# Patient Record
Sex: Female | Born: 1955 | Race: Black or African American | Hispanic: No | Marital: Single | Smoking: Never smoker
Health system: Southern US, Community
[De-identification: ages and names within clinical notes are randomized; demographics above are authoritative.]

## PROBLEM LIST (undated history)

## (undated) DIAGNOSIS — F32A Depression, unspecified: Secondary | ICD-10-CM

## (undated) DIAGNOSIS — I493 Ventricular premature depolarization: Secondary | ICD-10-CM

## (undated) DIAGNOSIS — I10 Essential (primary) hypertension: Secondary | ICD-10-CM

## (undated) DIAGNOSIS — Z9289 Personal history of other medical treatment: Secondary | ICD-10-CM

## (undated) DIAGNOSIS — IMO0002 Reserved for concepts with insufficient information to code with codable children: Secondary | ICD-10-CM

## (undated) DIAGNOSIS — F329 Major depressive disorder, single episode, unspecified: Secondary | ICD-10-CM

## (undated) DIAGNOSIS — J40 Bronchitis, not specified as acute or chronic: Secondary | ICD-10-CM

## (undated) DIAGNOSIS — M1990 Unspecified osteoarthritis, unspecified site: Secondary | ICD-10-CM

## (undated) DIAGNOSIS — E119 Type 2 diabetes mellitus without complications: Secondary | ICD-10-CM

## (undated) HISTORY — DX: Essential (primary) hypertension: I10

## (undated) HISTORY — DX: Major depressive disorder, single episode, unspecified: F32.9

## (undated) HISTORY — DX: Ventricular premature depolarization: I49.3

## (undated) HISTORY — DX: Unspecified osteoarthritis, unspecified site: M19.90

## (undated) HISTORY — DX: Depression, unspecified: F32.A

## (undated) HISTORY — PX: KNEE ARTHROSCOPY: SUR90

## (undated) HISTORY — DX: Personal history of other medical treatment: Z92.89

---

## 1983-05-27 HISTORY — PX: CARPAL TUNNEL RELEASE: SHX101

## 1984-05-26 HISTORY — PX: HEMORRHOID SURGERY: SHX153

## 1986-05-26 HISTORY — PX: ABDOMINAL HYSTERECTOMY: SHX81

## 1987-05-27 HISTORY — PX: CHOLECYSTECTOMY: SHX55

## 1999-08-02 ENCOUNTER — Emergency Department (HOSPITAL_COMMUNITY): Admission: EM | Admit: 1999-08-02 | Discharge: 1999-08-02 | Payer: Self-pay | Admitting: *Deleted

## 1999-08-18 ENCOUNTER — Emergency Department (HOSPITAL_COMMUNITY): Admission: EM | Admit: 1999-08-18 | Discharge: 1999-08-18 | Payer: Self-pay | Admitting: Emergency Medicine

## 1999-11-29 ENCOUNTER — Encounter: Payer: Self-pay | Admitting: Family Medicine

## 1999-11-29 ENCOUNTER — Ambulatory Visit (HOSPITAL_COMMUNITY): Admission: RE | Admit: 1999-11-29 | Discharge: 1999-11-29 | Payer: Self-pay | Admitting: Family Medicine

## 1999-12-08 ENCOUNTER — Emergency Department (HOSPITAL_COMMUNITY): Admission: EM | Admit: 1999-12-08 | Discharge: 1999-12-08 | Payer: Self-pay | Admitting: Internal Medicine

## 1999-12-10 ENCOUNTER — Ambulatory Visit (HOSPITAL_COMMUNITY): Admission: RE | Admit: 1999-12-10 | Discharge: 1999-12-10 | Payer: Self-pay | Admitting: Family Medicine

## 1999-12-10 ENCOUNTER — Encounter: Payer: Self-pay | Admitting: Family Medicine

## 2001-06-20 ENCOUNTER — Emergency Department (HOSPITAL_COMMUNITY): Admission: EM | Admit: 2001-06-20 | Discharge: 2001-06-20 | Payer: Self-pay | Admitting: Emergency Medicine

## 2001-08-18 ENCOUNTER — Encounter: Payer: Self-pay | Admitting: Family Medicine

## 2001-08-18 ENCOUNTER — Encounter: Admission: RE | Admit: 2001-08-18 | Discharge: 2001-08-18 | Payer: Self-pay | Admitting: Family Medicine

## 2002-10-12 ENCOUNTER — Emergency Department (HOSPITAL_COMMUNITY): Admission: EM | Admit: 2002-10-12 | Discharge: 2002-10-12 | Payer: Self-pay | Admitting: Emergency Medicine

## 2003-10-31 ENCOUNTER — Emergency Department (HOSPITAL_COMMUNITY): Admission: EM | Admit: 2003-10-31 | Discharge: 2003-10-31 | Payer: Self-pay | Admitting: Emergency Medicine

## 2003-12-29 ENCOUNTER — Ambulatory Visit (HOSPITAL_COMMUNITY): Admission: RE | Admit: 2003-12-29 | Discharge: 2003-12-29 | Payer: Self-pay | Admitting: Family Medicine

## 2004-05-24 ENCOUNTER — Ambulatory Visit: Payer: Self-pay | Admitting: *Deleted

## 2004-05-24 ENCOUNTER — Ambulatory Visit: Payer: Self-pay | Admitting: Family Medicine

## 2004-06-04 ENCOUNTER — Ambulatory Visit (HOSPITAL_COMMUNITY): Admission: RE | Admit: 2004-06-04 | Discharge: 2004-06-04 | Payer: Self-pay | Admitting: Family Medicine

## 2004-06-24 ENCOUNTER — Ambulatory Visit: Payer: Self-pay | Admitting: Family Medicine

## 2004-07-02 ENCOUNTER — Ambulatory Visit (HOSPITAL_COMMUNITY): Admission: RE | Admit: 2004-07-02 | Discharge: 2004-07-02 | Payer: Self-pay | Admitting: Family Medicine

## 2004-11-25 ENCOUNTER — Ambulatory Visit: Payer: Self-pay | Admitting: Family Medicine

## 2005-09-25 ENCOUNTER — Emergency Department (HOSPITAL_COMMUNITY): Admission: EM | Admit: 2005-09-25 | Discharge: 2005-09-25 | Payer: Self-pay | Admitting: Family Medicine

## 2005-10-07 ENCOUNTER — Ambulatory Visit: Payer: Self-pay | Admitting: Family Medicine

## 2005-11-27 ENCOUNTER — Ambulatory Visit: Payer: Self-pay | Admitting: Family Medicine

## 2005-12-02 ENCOUNTER — Ambulatory Visit (HOSPITAL_COMMUNITY): Admission: RE | Admit: 2005-12-02 | Discharge: 2005-12-02 | Payer: Self-pay | Admitting: Family Medicine

## 2006-01-20 ENCOUNTER — Ambulatory Visit: Payer: Self-pay | Admitting: Family Medicine

## 2006-02-03 ENCOUNTER — Emergency Department (HOSPITAL_COMMUNITY): Admission: EM | Admit: 2006-02-03 | Discharge: 2006-02-04 | Payer: Self-pay | Admitting: Emergency Medicine

## 2006-02-05 ENCOUNTER — Emergency Department (HOSPITAL_COMMUNITY): Admission: EM | Admit: 2006-02-05 | Discharge: 2006-02-05 | Payer: Self-pay | Admitting: Family Medicine

## 2006-02-06 ENCOUNTER — Ambulatory Visit: Payer: Self-pay | Admitting: Nurse Practitioner

## 2006-02-10 ENCOUNTER — Ambulatory Visit: Payer: Self-pay | Admitting: Family Medicine

## 2006-07-29 ENCOUNTER — Telehealth: Payer: Self-pay | Admitting: *Deleted

## 2006-09-22 ENCOUNTER — Telehealth (INDEPENDENT_AMBULATORY_CARE_PROVIDER_SITE_OTHER): Payer: Self-pay

## 2006-09-23 ENCOUNTER — Ambulatory Visit: Payer: Self-pay | Admitting: Family Medicine

## 2006-09-23 ENCOUNTER — Encounter: Payer: Self-pay | Admitting: Family Medicine

## 2006-09-23 ENCOUNTER — Encounter: Payer: Self-pay | Admitting: *Deleted

## 2006-09-23 DIAGNOSIS — K625 Hemorrhage of anus and rectum: Secondary | ICD-10-CM | POA: Insufficient documentation

## 2006-09-24 ENCOUNTER — Telehealth: Payer: Self-pay | Admitting: *Deleted

## 2006-09-24 ENCOUNTER — Telehealth: Payer: Self-pay | Admitting: Family Medicine

## 2006-09-29 ENCOUNTER — Ambulatory Visit: Payer: Self-pay | Admitting: Family Medicine

## 2006-09-29 ENCOUNTER — Encounter (INDEPENDENT_AMBULATORY_CARE_PROVIDER_SITE_OTHER): Payer: Self-pay | Admitting: *Deleted

## 2006-09-29 LAB — CONVERTED CEMR LAB
BUN: 14 mg/dL (ref 6–23)
Calcium: 9.3 mg/dL (ref 8.4–10.5)
Chloride: 104 meq/L (ref 96–112)
Creatinine, Ser: 0.71 mg/dL (ref 0.40–1.20)
Glucose, Bld: 86 mg/dL (ref 70–99)
Potassium: 4.2 meq/L (ref 3.5–5.3)
Sodium: 142 meq/L (ref 135–145)

## 2006-10-02 ENCOUNTER — Encounter: Payer: Self-pay | Admitting: Family Medicine

## 2006-10-22 ENCOUNTER — Ambulatory Visit: Payer: Self-pay | Admitting: Family Medicine

## 2006-10-26 ENCOUNTER — Telehealth (INDEPENDENT_AMBULATORY_CARE_PROVIDER_SITE_OTHER): Payer: Self-pay | Admitting: Family Medicine

## 2006-11-10 ENCOUNTER — Ambulatory Visit: Payer: Self-pay | Admitting: Gastroenterology

## 2006-11-10 LAB — CONVERTED CEMR LAB
Basophils Relative: 0.6 % (ref 0.0–1.0)
Eosinophils Relative: 3.3 % (ref 0.0–5.0)
Monocytes Relative: 8.6 % (ref 3.0–11.0)
Platelets: 275 10*3/uL (ref 150–400)
RBC: 3.77 M/uL — ABNORMAL LOW (ref 3.87–5.11)
RDW: 12.8 % (ref 11.5–14.6)
WBC: 6.1 10*3/uL (ref 4.5–10.5)

## 2006-11-13 ENCOUNTER — Telehealth: Payer: Self-pay | Admitting: *Deleted

## 2006-12-07 ENCOUNTER — Ambulatory Visit (HOSPITAL_COMMUNITY): Admission: RE | Admit: 2006-12-07 | Discharge: 2006-12-07 | Payer: Self-pay | Admitting: Gastroenterology

## 2006-12-07 ENCOUNTER — Encounter: Payer: Self-pay | Admitting: Gastroenterology

## 2006-12-11 ENCOUNTER — Ambulatory Visit: Payer: Self-pay | Admitting: Gastroenterology

## 2007-01-01 ENCOUNTER — Encounter: Payer: Self-pay | Admitting: Family Medicine

## 2007-02-04 ENCOUNTER — Encounter: Payer: Self-pay | Admitting: Family Medicine

## 2007-04-29 ENCOUNTER — Ambulatory Visit: Payer: Self-pay | Admitting: Family Medicine

## 2007-04-29 DIAGNOSIS — N318 Other neuromuscular dysfunction of bladder: Secondary | ICD-10-CM | POA: Insufficient documentation

## 2007-05-10 ENCOUNTER — Emergency Department (HOSPITAL_COMMUNITY): Admission: EM | Admit: 2007-05-10 | Discharge: 2007-05-10 | Payer: Self-pay | Admitting: Family Medicine

## 2007-06-02 ENCOUNTER — Telehealth: Payer: Self-pay | Admitting: *Deleted

## 2007-06-03 ENCOUNTER — Encounter: Payer: Self-pay | Admitting: *Deleted

## 2007-09-18 DIAGNOSIS — I10 Essential (primary) hypertension: Secondary | ICD-10-CM | POA: Insufficient documentation

## 2007-10-25 ENCOUNTER — Telehealth: Payer: Self-pay | Admitting: Family Medicine

## 2007-10-26 ENCOUNTER — Telehealth: Payer: Self-pay | Admitting: *Deleted

## 2007-11-25 ENCOUNTER — Telehealth: Payer: Self-pay | Admitting: *Deleted

## 2008-02-21 ENCOUNTER — Telehealth (INDEPENDENT_AMBULATORY_CARE_PROVIDER_SITE_OTHER): Payer: Self-pay | Admitting: *Deleted

## 2008-09-06 ENCOUNTER — Emergency Department (HOSPITAL_COMMUNITY): Admission: EM | Admit: 2008-09-06 | Discharge: 2008-09-06 | Payer: Self-pay | Admitting: Family Medicine

## 2008-09-15 ENCOUNTER — Ambulatory Visit: Payer: Self-pay | Admitting: Family Medicine

## 2008-09-15 ENCOUNTER — Encounter: Payer: Self-pay | Admitting: Family Medicine

## 2008-09-15 DIAGNOSIS — S6990XA Unspecified injury of unspecified wrist, hand and finger(s), initial encounter: Secondary | ICD-10-CM | POA: Insufficient documentation

## 2008-09-15 DIAGNOSIS — S6980XA Other specified injuries of unspecified wrist, hand and finger(s), initial encounter: Secondary | ICD-10-CM | POA: Insufficient documentation

## 2008-09-15 LAB — CONVERTED CEMR LAB
CO2: 30 meq/L (ref 19–32)
Chloride: 100 meq/L (ref 96–112)
Creatinine, Ser: 0.67 mg/dL (ref 0.40–1.20)
HCT: 41.7 % (ref 36.0–46.0)
HDL: 53 mg/dL (ref >39)
LDL Cholesterol: 105 mg/dL — ABNORMAL HIGH (ref 0–99)
Platelets: 282 10*3/uL (ref 150–400)
Potassium: 4.5 meq/L (ref 3.5–5.3)
RDW: 13.9 % (ref 11.5–15.5)
Total CHOL/HDL Ratio: 3.5
Triglycerides: 123 mg/dL (ref <150)
VLDL: 25 mg/dL (ref 0–40)
WBC: 6.1 10*3/uL (ref 4.0–10.5)

## 2008-09-16 ENCOUNTER — Encounter: Payer: Self-pay | Admitting: Family Medicine

## 2008-09-29 ENCOUNTER — Telehealth (INDEPENDENT_AMBULATORY_CARE_PROVIDER_SITE_OTHER): Payer: Self-pay | Admitting: *Deleted

## 2008-10-24 ENCOUNTER — Encounter: Payer: Self-pay | Admitting: Family Medicine

## 2008-12-14 ENCOUNTER — Telehealth: Payer: Self-pay | Admitting: Family Medicine

## 2008-12-15 ENCOUNTER — Encounter: Payer: Self-pay | Admitting: Family Medicine

## 2008-12-15 ENCOUNTER — Ambulatory Visit: Payer: Self-pay | Admitting: Family Medicine

## 2008-12-15 DIAGNOSIS — K029 Dental caries, unspecified: Secondary | ICD-10-CM | POA: Insufficient documentation

## 2008-12-15 DIAGNOSIS — M159 Polyosteoarthritis, unspecified: Secondary | ICD-10-CM | POA: Insufficient documentation

## 2009-02-03 ENCOUNTER — Emergency Department (HOSPITAL_COMMUNITY): Admission: EM | Admit: 2009-02-03 | Discharge: 2009-02-03 | Payer: Self-pay | Admitting: Emergency Medicine

## 2009-04-11 ENCOUNTER — Telehealth (INDEPENDENT_AMBULATORY_CARE_PROVIDER_SITE_OTHER): Payer: Self-pay | Admitting: Family Medicine

## 2009-06-11 ENCOUNTER — Emergency Department (HOSPITAL_COMMUNITY): Admission: EM | Admit: 2009-06-11 | Discharge: 2009-06-11 | Payer: Self-pay | Admitting: Emergency Medicine

## 2009-10-05 ENCOUNTER — Encounter: Payer: Self-pay | Admitting: Family Medicine

## 2010-01-21 ENCOUNTER — Ambulatory Visit: Payer: Self-pay | Admitting: Family Medicine

## 2010-01-21 DIAGNOSIS — R5381 Other malaise: Secondary | ICD-10-CM | POA: Insufficient documentation

## 2010-01-21 DIAGNOSIS — R209 Unspecified disturbances of skin sensation: Secondary | ICD-10-CM | POA: Insufficient documentation

## 2010-01-21 DIAGNOSIS — R5383 Other fatigue: Secondary | ICD-10-CM

## 2010-03-07 ENCOUNTER — Encounter: Payer: Self-pay | Admitting: Family Medicine

## 2010-03-07 ENCOUNTER — Ambulatory Visit: Payer: Self-pay | Admitting: Family Medicine

## 2010-03-07 LAB — CONVERTED CEMR LAB
BUN: 13 mg/dL (ref 6–23)
Chloride: 101 meq/L (ref 96–112)
Hemoglobin: 13.1 g/dL (ref 12.0–15.0)
LDL Cholesterol: 92 mg/dL (ref 0–99)
MCHC: 33.1 g/dL (ref 30.0–36.0)
MCV: 94.1 fL (ref 78.0–100.0)
Potassium: 4 meq/L (ref 3.5–5.3)
RBC: 4.21 M/uL (ref 3.87–5.11)
VLDL: 14 mg/dL (ref 0–40)
Vitamin B-12: 589 pg/mL (ref 211–911)

## 2010-06-15 ENCOUNTER — Encounter: Payer: Self-pay | Admitting: *Deleted

## 2010-06-25 NOTE — Assessment & Plan Note (Signed)
Summary: f/u meds,df   Vital Signs:  Patient profile:   55 year old female Height:      64.25 inches Weight:      209 pounds BMI:     35.72 BSA:     2.00 Temp:     97.7 degrees F Pulse rate:   65 / minute BP sitting:   134 / 88  Vitals Entered By: Jone Baseman CMA (January 21, 2010 10:17 AM) CC: f/u meds Is Patient Diabetic? No Pain Assessment Patient in pain? yes     Location: feet and legs Intensity: 8   Primary Care Provider:  Asher Muir MD  CC:  f/u meds.  History of Present Illness: Pt here for nubmness, pain in feet and hands.  THis has been a long standing problems.  Feet have been hurting since 1988 when was seen by [facility] and dx with heel spur/ arthritis.  Reports her feet have been cold, achy, numb, tingling and painful, worsening for 2 years.  She fell recently from the pain and was seen at Urgent Care and again told she has arthritis.   Taking her BP meds as prescribed. No problems or side effects.     Pt has numbness in hands, s/p carpal tunnel release.  + Shoulder pain  Has not had a mammogram in years.  Feels sharp pains in breasts sometimes for years.    No pap for years.  Unsure if she has a cervix.  Had hysterectomy in 1988.    Also with hair loss - was told it was alopecia.   Habits & Providers  Alcohol-Tobacco-Diet     Tobacco Status: never  Current Medications (verified): 1)  Hydrochlorothiazide 25 Mg Tabs (Hydrochlorothiazide) .... Take 1 Tablet By Mouth Daily For Blood Pressure 2)  Ranitidine Hcl 150 Mg Tabs (Ranitidine Hcl) .... Take 1 Tablet By Mouth Two Times A Day To Protect Your Stomach 3)  Diclofenac Sodium 75 Mg Tbec (Diclofenac Sodium) .Marland Kitchen.. 1 By Mouth Two Times A Day As Needed Arthritis Pain.  Take With Food. 4)  Fish Oil  Oil (Fish Oil) 5)  Calcium Plus Vitamin D 600-100 Mg-Unit Caps (Calcium Carbonate-Vitamin D)  Allergies (verified): No Known Drug Allergies  Past History:  Past medical, surgical, family and  social histories (including risk factors) reviewed, and no changes noted (except as noted below).  Past Medical History: Reviewed history from 10/02/2006 and no changes required. iPMH: C/o Blood in stools since 06/1994 hemoccult + stool 1/06 Barium enema 2//06 -- colonic diverticula Hx external hemorrhoids Chronic constipation Hx colon polyps Hx LBP w/ paresthesia, hx several falls in past while working Hx DJD Obesity Urinary incontinence, has been on detrol LA 2mg  once daily and vesicare 5mg  once daily in past Asthma  GERD  Labs: 7/07: Hb 12.8, TSH 0.802  5/07: T-chol 164, TG 125, HDL 51, LDL 88, VLDL 25, ratio 3.2          rheumatoid factor <20, uric acid 5.1 1/06: CT abd/pelvis: focal fatty infiltration of liver, left hepatic lobe 12/05: PAP negative 8/05: Mammogram neg 7/05: cytopath on urine: no malignant cells          ANA neg 10/96: HIV neg R leg pain -- has been on ultram  Hx HTN (1996) -- was on Procardia  Past Surgical History: Reviewed history from 10/02/2006 and no changes required. PSH: Hysterectomy Surgery for hemorrhoids in 1987-1988  Cholecystectomy  Family History: Reviewed history from 12/15/2008 and no changes required. mother with rheumatoid arthritis  gf died age 76 MI, lots of cousins with  heart disease at young age. Uncle had throat ca,  other uncle with leukemia Gm died 27 with cancer  Social History: Reviewed history from 12/15/2008 and no changes required. works at [employer] at [employer]   No smoking, No ETOH, No other drugs. Single.    Review of Systems       The patient complains of hematochezia.  The patient denies chest pain.         Pt also with constipation, fatigue.  Feels short of breath due to her asthma.    Physical Exam  General:  Obese, no distress.  vital noted Lungs:  Normal respiratory effort, chest expands symmetrically. Lungs are clear to auscultation, no crackles or wheezes. Heart:  Normal rate and regular  rhythm. S1 and S2 normal without gallop, murmur, click, rub or other extra sounds. Extremities:  B feet without lesions.  DP 2+ B.  + Callous formation on heels B.  Sensation to cold and light touch intact.  Monofiliament felt in all locations on R foot except base of 5th toe, between first and secon toes and over heel.  Felt in all locations L foot excpet 4th toe, base of 5th toe, and heel.   Neurologic:  Unable to toe walk due to pain in legs.  Able to heel walk.  Tandem gait slightly unsteady, reports it is from arthritis in knees.   Impression & Recommendations:  Problem # 1:  NUMBNESS (ICD-782.0) Pt with long standing numbness and pain in her feet.  Will check some labs today. If no clear cause for pain/numbness, will try NSAID for relief. Orders: B12-[facility] ([id number]) TSH-[facility] ([id number]) RPR-[facility] [phone]) [facility]- Est Level  3 ([id number])  Problem # 2:  HYPERTENSION (ICD-401.9) Ran out of meds today.  Will check BMP today.  Also due for lipid panel.  Fasting today, will check today Her updated medication list for this problem includes:    Hydrochlorothiazide 25 Mg Tabs (Hydrochlorothiazide) .Marland Kitchen... Take 1 tablet by mouth daily for blood pressure  Orders: Lipid-[facility] ([id number]) Basic Met-[facility] ([id number]) [facility]- Est Level  3 (67209)  Problem # 3:  WEAKNESS (ICD-780.79) Pt with weakness and fatigue.  Will check a CBC.  + rectal blood loss, dx with hemorrhoids by colonoscopy Orders: CBC-[facility] (47096) B12-[facility] ([id number]) [facility]- Est Level  3 (54650)  Problem # 4:  Preventive Health Care (ICD-V70.0) DUe for mammo.  WIll research to see if pap needed.  Complete Medication List: 1)  Hydrochlorothiazide 25 Mg Tabs (Hydrochlorothiazide) .... Take 1 tablet by mouth daily for blood pressure 2)  Ranitidine Hcl 150 Mg Tabs (Ranitidine hcl) .... Take 1 tablet by mouth two times a day to protect your stomach 3)  Diclofenac Sodium 75 Mg Tbec (Diclofenac sodium) .Marland Kitchen.. 1 by mouth two times a day as  needed arthritis pain.  take with food. 4)  Fish Oil Oil (Fish oil) 5)  Calcium Plus Vitamin D 600-100 Mg-unit Caps (Calcium carbonate-vitamin d)  Patient Instructions: 1)  Please set up an appt for a mammogram. 2)  Please come back and see me (Dr. Sarah Swaziland) in 3 months, or sooner if you need to.

## 2010-06-25 NOTE — Letter (Signed)
Summary: Generic Letter  [facility]  [address]   [place], [place] [address]   Phone: [phone]  Fax: [phone]    10/05/2009  Karen Ferguson [address] [place], [place]  [address]  Dear [name],   You have not been seen in our office in some time.  Please call [phone] and schedule an office visit for your blood pressure.  I look forward to seeing you.         Sincerely,   Asher Muir MD  Appended Document: Generic Letter mailed.

## 2010-08-12 LAB — DIFFERENTIAL
Basophils Absolute: 0 10*3/uL (ref 0.0–0.1)
Basophils Relative: 1 % (ref 0–1)
Eosinophils Absolute: 0.2 10*3/uL (ref 0.0–0.7)
Monocytes Absolute: 0.6 10*3/uL (ref 0.1–1.0)
Monocytes Relative: 8 % (ref 3–12)
Neutro Abs: 3.7 10*3/uL (ref 1.7–7.7)
Neutrophils Relative %: 49 % (ref 43–77)

## 2010-08-12 LAB — BASIC METABOLIC PANEL
Calcium: 9 mg/dL (ref 8.4–10.5)
Chloride: 95 mEq/L — ABNORMAL LOW (ref 96–112)
Creatinine, Ser: 0.95 mg/dL (ref 0.4–1.2)
GFR calc non Af Amer: >60 mL/min (ref >60)
Sodium: 132 mEq/L — ABNORMAL LOW (ref 135–145)

## 2010-08-12 LAB — CBC
Hemoglobin: 13.4 g/dL (ref 12.0–15.0)
MCHC: 34 g/dL (ref 30.0–36.0)
MCV: 96.7 fL (ref 78.0–100.0)
RBC: 4.08 MIL/uL (ref 3.87–5.11)
WBC: 7.4 10*3/uL (ref 4.0–10.5)

## 2010-09-26 ENCOUNTER — Ambulatory Visit: Payer: Self-pay | Admitting: Family Medicine

## 2010-10-01 ENCOUNTER — Ambulatory Visit: Payer: Self-pay | Admitting: Family Medicine

## 2010-10-07 ENCOUNTER — Ambulatory Visit: Payer: Self-pay | Admitting: Family Medicine

## 2010-10-08 NOTE — Assessment & Plan Note (Signed)
[facility]                         GASTROENTEROLOGY OFFICE NOTE   NAME:Ferguson, Karen                           MRN:          [id number]  DATE:11/10/2006                            DOB:          [birth year]    REFERRING PHYSICIAN:  Sylvan Cheese, M.D.   REASON FOR REFERRAL:  Dr. Yetta Barre asked me to evaluate [name] in  consultation regarding rectal bleeding, chronic constipation.   [name] is a pleasant, 55 year old woman whose had trouble with  constipation and intermittent rectal bleeding for at least 20 years. She  had a colonoscopy by Dr. Nadine Counts Buccini in 1987 that she says was normal.  She had a hemorrhoid procedure by Dr. Lurene Shadow in the late 80s and did get  some relief from her bleeding for at least several years but for the  past 10-15 years, she has a small amount of blood with nearly every  bowel movement. She describes chronic constipation, having to strain to  move her bowels. She takes a variety of different stool regimens  including high fiber cereals, fiber pills, laxatives on nearly a daily  basis and enemas on nearly a daily basis. With this regimen, she will  only move her bowels 2-3 times a week and usually sees a small amount of  blood on the tissue paper or round the stool. Without that regimen, she  estimates she should not move her bowels more than once a week. She has  more recently been tried on MiraLax, she is taking 1 tablespoon daily.   REVIEW OF SYSTEMS:  Notable for a 60 pound weight gain in the past year  per patient, mild bloating. 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   PAST MEDICAL HISTORY:  Hypertension, asthma, arthritis, status post  cholecystectomy, status post hemorrhoid surgery in 1988, status post  hysterectomy.   CURRENT MEDICATIONS:  Hydrochlorothiazide and MiraLax.   ALLERGIES:  No known drug allergies.   SOCIAL HISTORY:  Divorced, one daughter,  not working, nonsmoker, drinks  alcohol intermittently.   FAMILY HISTORY:  No colon cancer or colon polyps in family.   PHYSICAL EXAMINATION:  VITAL SIGNS:  5 feet 4 inches, 221 pounds, blood  pressure 142/92, pulse 68.  CONSTITUTIONAL:  Obese otherwise well appearing.  NEUROLOGIC:  Alert and oriented x3.  EYES:  Extraocular movements intact.  MOUTH:  Oropharynx moist no lesions.  NECK:  Supple, no lymphadenopathy.  CARDIOVASCULAR:  Heart regular rate and rhythm.  LUNGS:  Clear to auscultation bilaterally.  ABDOMEN:  Soft, nontender, nondistended, normal bowel sounds.  EXTREMITIES:  No lower extremity edema.  SKIN:  No rashes or lesions on visible extremities.   ASSESSMENT/PLAN:  A 55 year old woman with chronic constipation and  intermittent rectal bleeding.   I did not mention above but she did have contrast barium enema in early  2006 that describes some colonic diverticula but no obstructing lesion  or large polyps.   We should proceed with full colonoscopy to evaluate her chronic  constipation and her intermittent rectal bleeding.  I suspect that she  has minor hemorrhoids. She does not appear new but we will get a CBC to  check to see if that is indeed the case. I would also like to get her  off of her regimen of laxatives and enemas and transition her to  MiraLax. She is taking a very small dose of MiraLax on a once daily  basis. I will get her on 2 full scoops of the MiraLax measured by the  cup on a daily basis. She knows that she can increase this to 3 scoops  daily if she does not get much relief on the 2 scoops. She will return  for colonoscopy in 3-4 weeks. I would like to give some time for the  MiraLax to start kicking in to help her chronic constipation and that  will also help her prep more successfully.     Rachael Fee, MD  Electronically Signed    DPJ/MedQ  DD: 11/10/2006  DT: 11/10/2006  Job #: [id number]   cc:   Sylvan Cheese, M.D.

## 2010-10-15 ENCOUNTER — Ambulatory Visit (INDEPENDENT_AMBULATORY_CARE_PROVIDER_SITE_OTHER): Payer: Self-pay | Admitting: Family Medicine

## 2010-10-15 ENCOUNTER — Encounter: Payer: Self-pay | Admitting: Family Medicine

## 2010-10-15 VITALS — BP 124/86 | HR 73 | Temp 98.3°F | Ht 65.0 in | Wt 208.4 lb

## 2010-10-15 DIAGNOSIS — M159 Polyosteoarthritis, unspecified: Secondary | ICD-10-CM

## 2010-10-15 DIAGNOSIS — I10 Essential (primary) hypertension: Secondary | ICD-10-CM

## 2010-10-15 DIAGNOSIS — M25569 Pain in unspecified knee: Secondary | ICD-10-CM

## 2010-10-15 DIAGNOSIS — E669 Obesity, unspecified: Secondary | ICD-10-CM

## 2010-10-15 MED ORDER — FLUOXETINE HCL 20 MG PO CAPS: 20.0000 mg | ORAL_CAPSULE | Freq: Every day | ORAL | Status: DC

## 2010-10-15 MED ORDER — IBUPROFEN 800 MG PO TABS: 800.0000 mg | ORAL_TABLET | Freq: Three times a day (TID) | ORAL | Status: AC | PRN

## 2010-10-15 NOTE — Patient Instructions (Addendum)
Please schedule your mammogram.  Let us know if you have any problems scheduling that. We will get x rays from the hospital.  You can just go to the hospital and they will have the order for that. I will call in the ibuprofen for you.   [facility]

## 2010-10-16 NOTE — Progress Notes (Signed)
  Subjective:    Patient ID: Karen Ferguson, female    DOB: March 08, 1956, 55 y.o.   MRN: [id number]  HPI 36 you female requests referral to arthritis doctor.  She reports entire body pain, mostly in knees, but also in ankles, feet, back and hips.  Pain migrates.  This has been going on for years.  It is imporved with ibuprofen 800 mg.  Voltaren did not help - was taking but ran out last month.  She found the name of Deboraha Sprang in phone book and would like to see him, but also had carpal tunnel release by Dr. Thurston Hole.  She has not tried joint injections to relieve pain.  Pain keeps her from activities, makes work diffficult.  Climbing stairs worsens pain.  She also requests meds for hot flashes.  Occur almost daily, especially when she is working in [employer].  Also wake her up from sleep.  Due for mammogram.  Would like to schedule.  Feels sad at times, no anhedonia, +guilt, mild concentration problems, low energy, psychomotor retardation.  Appetite ok, denies SI/HI.  Feels most of the symptoms are due to her total body pain.   Review of Systems  See HPI     Objective:   Physical Exam  Constitutional: No distress.       Obese  Cardiovascular: Normal rate, regular rhythm and normal heart sounds.  Exam reveals no gallop and no friction rub.   No murmur heard. Pulmonary/Chest: Effort normal and breath sounds normal. No respiratory distress. She has no wheezes. She has no rales. She exhibits no tenderness.  Musculoskeletal:       B knees without swelling, erythema warmth or crepitus.  FROM B knees.  Stable ant/post/lat/med. Back with limited flexion due to pain and sensation of tight hamstrings.  Limited extension due to pain.  No spinal TTP.  +spasm of paraspinous muscles.  No TTP in fibromyalgia trigger points.     Skin: She is not diaphoretic.  Psychiatric: She has a normal mood and affect. Her behavior is normal. Judgment and thought content normal.          Assessment &  Plan:

## 2010-10-17 ENCOUNTER — Encounter: Payer: Self-pay | Admitting: Family Medicine

## 2010-10-17 DIAGNOSIS — E669 Obesity, unspecified: Secondary | ICD-10-CM | POA: Insufficient documentation

## 2010-10-17 NOTE — Assessment & Plan Note (Signed)
Doing well with good control.  Continue current management.

## 2010-10-17 NOTE — Assessment & Plan Note (Signed)
Pt in significant pain, interested in referral to ortho.  Will start with plain films and with ibuprofen, which has relieved problems in the past.  If not adequate relief with meds, trial of injections.   PT could also benefit this pt, but she declines at this time.

## 2010-10-17 NOTE — Assessment & Plan Note (Signed)
Likely contributing to pt's arthritis and knee pain.  Advised exercise[facility]

## 2010-11-25 ENCOUNTER — Ambulatory Visit (HOSPITAL_COMMUNITY)
Admission: RE | Admit: 2010-11-25 | Discharge: 2010-11-25 | Disposition: A | Discharge status: Home / Self Care | Payer: Self-pay | Source: Ambulatory Visit | Attending: Family Medicine | Admitting: Family Medicine

## 2010-11-25 DIAGNOSIS — IMO0002 Reserved for concepts with insufficient information to code with codable children: Secondary | ICD-10-CM | POA: Insufficient documentation

## 2010-11-25 DIAGNOSIS — M1710 Unilateral primary osteoarthritis, unspecified knee: Secondary | ICD-10-CM | POA: Insufficient documentation

## 2010-11-25 DIAGNOSIS — M25569 Pain in unspecified knee: Secondary | ICD-10-CM | POA: Insufficient documentation

## 2010-12-02 ENCOUNTER — Telehealth: Payer: Self-pay | Admitting: Family Medicine

## 2010-12-02 NOTE — Telephone Encounter (Signed)
Pt asking for xray results on her leg

## 2010-12-03 NOTE — Telephone Encounter (Signed)
Pt has arthritis in her knees. Films don't show anything else that would be concerning.

## 2010-12-03 NOTE — Telephone Encounter (Signed)
Pt is calling again.

## 2010-12-04 ENCOUNTER — Telehealth: Payer: Self-pay | Admitting: Family Medicine

## 2010-12-04 NOTE — Telephone Encounter (Signed)
LM for patient to call back.

## 2010-12-04 NOTE — Telephone Encounter (Signed)
Call for results of xray on her knee.  Read the results and Karen Ferguson is still in quite a bit of pain from her knee to her hip all of the time.

## 2010-12-04 NOTE — Telephone Encounter (Signed)
Has called back and would like a referral to a specialist.

## 2010-12-04 NOTE — Telephone Encounter (Signed)
She is schedule for 7/16 at 9:00.Karen Ferguson

## 2010-12-04 NOTE — Telephone Encounter (Signed)
Could we schedule her with anyone for knee injections?  Monday, the 16th would be great for her (she is off work), but I think I am booked.  Seh is willing to see someone else, and early in the morning is best for her.  Thanks!

## 2010-12-04 NOTE — Telephone Encounter (Signed)
Spoke with pt.  Reviewed the information from films.  Offered knee injections here vs referral to ortho.  She is willing to try knee injections here.  Unfortunately, my next clinic is already booked, and she would like to be seen before the 23rd.  She is very willing to see a different provider for the injections.

## 2010-12-05 NOTE — Telephone Encounter (Signed)
Informed of below 

## 2010-12-09 ENCOUNTER — Encounter: Payer: Self-pay | Admitting: Family Medicine

## 2010-12-09 ENCOUNTER — Ambulatory Visit (INDEPENDENT_AMBULATORY_CARE_PROVIDER_SITE_OTHER): Payer: Self-pay | Admitting: Family Medicine

## 2010-12-09 DIAGNOSIS — M159 Polyosteoarthritis, unspecified: Secondary | ICD-10-CM

## 2010-12-09 NOTE — Progress Notes (Signed)
  Subjective:    Patient ID: Karen Ferguson, female    DOB: Jun 30, 1955, 55 y.o.   MRN: [id number]  HPI 55 yo here for work in appt for knee injection.  Has been seen by PCP for longstanding bilateral knee pain for years.  Recent bilateral xrays show mild to moderate arthritis.  Past labs shows normal ANA and RF in 2007.  Patient has a family history of RA.  Weighty loss and PT discussed by PCP at previous visit.  Notes right knee is more painful that the left.  No new injury or worsening of pain.  Notes some swelling in medial thigh but not in knee joint.  Continued bilateral feet paraasthesias.  Has tried multiple NSAIDS and tylenol without relief.     Review of Systems no fever, chills, erythema, swelling.     Objective:   Physical Exam  Normal to inspection with no erythema or effusion or obvious bony abnormalities. Palpation normal with no warmth or joint line tenderness or patellar tenderness or condyle tenderness. ROM normal in flexion and extension and lower leg rotation.  Procedure Note: right knee injection Consent reviewed with patient and signed.  No contracindications to procedure. - landmarks identified and marked.superior-lateral approach - cleansed with betadine swab, then alcohol. -  Spray with vapocoolant, Injected with 4:1 1% lidocaine and kenolog. - patient tolerated procedure well.  No bleeding. - bandage placed.     Assessment & Plan:

## 2010-12-09 NOTE — Patient Instructions (Addendum)
Rest your knees for next few days May flare in the next day, use ice, ibuprofen, tylenol to help with pain Call if noticing sign of infection, redness, fever, warmth Follow-up with Dr. Swaziland to discuss how well the injection helped and if you would like other knee done

## 2010-12-09 NOTE — Assessment & Plan Note (Addendum)
Right knee injection performed today and given aftercare precautions.  .Discussed with patient the approach to managing chronic knee pain, has already tried multiple NSAIDS, discussed injections may play role in chronic management if she gets longstanding relief.  Advised to continue NSAID+ tylenol, mentioned role of PT and weight loss in management.    Asked her to make follow-up appt to discuss response to injection with PCP and may consider injecting other knee.

## 2011-01-08 ENCOUNTER — Other Ambulatory Visit: Payer: Self-pay | Admitting: Family Medicine

## 2011-01-08 DIAGNOSIS — I10 Essential (primary) hypertension: Secondary | ICD-10-CM

## 2011-03-04 ENCOUNTER — Ambulatory Visit: Payer: Self-pay

## 2011-04-09 ENCOUNTER — Emergency Department (HOSPITAL_COMMUNITY)
Admission: EM | Admit: 2011-04-09 | Discharge: 2011-04-09 | Disposition: A | Discharge status: Home / Self Care | Payer: Self-pay | Attending: Emergency Medicine | Admitting: Emergency Medicine

## 2011-04-09 ENCOUNTER — Encounter (HOSPITAL_COMMUNITY): Payer: Self-pay | Admitting: Emergency Medicine

## 2011-04-09 DIAGNOSIS — F3289 Other specified depressive episodes: Secondary | ICD-10-CM | POA: Insufficient documentation

## 2011-04-09 DIAGNOSIS — M129 Arthropathy, unspecified: Secondary | ICD-10-CM | POA: Insufficient documentation

## 2011-04-09 DIAGNOSIS — J45901 Unspecified asthma with (acute) exacerbation: Secondary | ICD-10-CM | POA: Insufficient documentation

## 2011-04-09 DIAGNOSIS — F329 Major depressive disorder, single episode, unspecified: Secondary | ICD-10-CM | POA: Insufficient documentation

## 2011-04-09 DIAGNOSIS — Z9889 Other specified postprocedural states: Secondary | ICD-10-CM | POA: Insufficient documentation

## 2011-04-09 DIAGNOSIS — Z79899 Other long term (current) drug therapy: Secondary | ICD-10-CM | POA: Insufficient documentation

## 2011-04-09 DIAGNOSIS — J40 Bronchitis, not specified as acute or chronic: Secondary | ICD-10-CM | POA: Insufficient documentation

## 2011-04-09 DIAGNOSIS — R0602 Shortness of breath: Secondary | ICD-10-CM | POA: Insufficient documentation

## 2011-04-09 DIAGNOSIS — I10 Essential (primary) hypertension: Secondary | ICD-10-CM | POA: Insufficient documentation

## 2011-04-09 MED ORDER — ALBUTEROL SULFATE (5 MG/ML) 0.5% IN NEBU
2.5000 mg | INHALATION_SOLUTION | Freq: Once | RESPIRATORY_TRACT | Status: AC
  Administered 2011-04-09: 2.5 mg via RESPIRATORY_TRACT

## 2011-04-09 MED ORDER — ALBUTEROL SULFATE HFA 108 (90 BASE) MCG/ACT IN AERS
4.0000 | INHALATION_SPRAY | Freq: Once | RESPIRATORY_TRACT | Status: AC
  Administered 2011-04-09: 4 via RESPIRATORY_TRACT
  Filled 2011-04-09: qty 6.7

## 2011-04-09 MED ORDER — AZITHROMYCIN 250 MG PO TABS: 250.0000 mg | ORAL_TABLET | Freq: Every day | ORAL | Status: AC

## 2011-04-09 MED ORDER — ALBUTEROL SULFATE (5 MG/ML) 0.5% IN NEBU
INHALATION_SOLUTION | RESPIRATORY_TRACT | Status: AC
  Administered 2011-04-09: 20:00:00
  Filled 2011-04-09: qty 1

## 2011-04-09 MED ORDER — PREDNISONE 20 MG PO TABS
60.0000 mg | ORAL_TABLET | Freq: Once | ORAL | Status: AC
  Administered 2011-04-09: 60 mg via ORAL
  Filled 2011-04-09: qty 3

## 2011-04-09 MED ORDER — PREDNISONE 20 MG PO TABS: 60.0000 mg | ORAL_TABLET | Freq: Every day | ORAL | Status: AC

## 2011-04-09 MED ORDER — AZITHROMYCIN 250 MG PO TABS
500.0000 mg | ORAL_TABLET | Freq: Once | ORAL | Status: AC
  Administered 2011-04-09: 500 mg via ORAL
  Filled 2011-04-09: qty 2

## 2011-04-09 MED ORDER — ALBUTEROL SULFATE HFA 108 (90 BASE) MCG/ACT IN AERS: 2.0000 | INHALATION_SPRAY | RESPIRATORY_TRACT | Status: DC | PRN

## 2011-04-09 NOTE — ED Notes (Signed)
PT. REPORTS SOB , WHEEZING , AND OCCASIONAL PRODUCTIVE COUGH ONSET THIS EVENING UNRELIEVED BY MDI .

## 2011-04-09 NOTE — ED Notes (Signed)
ALBUTEROL 5 MG NEBULIZER TREATMENT GIVEN AT TRIAGE -PROTOCOL.

## 2011-04-09 NOTE — ED Provider Notes (Signed)
History     CSN: [id number] Arrival date & time: 04/09/2011  7:12 PM   First MD Initiated Contact with Patient 04/09/11 2121      Chief Complaint  Patient presents with  . Shortness of Breath    (Consider location/radiation/quality/duration/timing/severity/associated sxs/prior treatment) Patient is a 55 y.o. female presenting with shortness of breath. The history is provided by the patient.  Shortness of Breath  The current episode started more than 2 weeks ago. The problem has been unchanged. The symptoms are relieved by nothing. The symptoms are aggravated by nothing. Associated symptoms include shortness of breath and wheezing. Pertinent negatives include no chest pain, no fever, no rhinorrhea and no cough. The cough is productive. Nothing relieves the cough. Nothing worsens the cough. There was no intake of a foreign body. She has not inhaled smoke recently. She has had intermittent steroid use. She has had no prior hospitalizations. She has had no prior ICU admissions. She has had no prior intubations. Her past medical history is significant for asthma.    Past Medical History  Diagnosis Date  . Arthritis   . Depression   . Hypertension   . Asthma     Past Surgical History  Procedure Date  . Abdominal hysterectomy   . Cholecystectomy   . Carpal tunnel release   . Hemorrhoid surgery     Family History  Problem Relation Age of Onset  . Arthritis Mother   . Arthritis Sister     History  Substance Use Topics  . Smoking status: Never Smoker   . Smokeless tobacco: Not on file  . Alcohol Use: No    OB History    Grav Para Term Preterm Abortions TAB SAB Ect Mult Living                  Review of Systems  Constitutional: Negative for fever.  HENT: Negative for rhinorrhea.   Respiratory: Positive for shortness of breath and wheezing. Negative for cough.   Cardiovascular: Negative for chest pain.  All other systems reviewed and are negative.    Allergies    Review of patient's allergies indicates no known allergies.  Home Medications   Current Outpatient Rx  Name Route Sig Dispense Refill  . ALBUTEROL SULFATE HFA 108 (90 BASE) MCG/ACT IN AERS Inhalation Inhale 2 puffs into the lungs every 6 (six) hours as needed. For shortness of breath     . HYDROCHLOROTHIAZIDE 25 MG PO TABS  TAKE ONE TABLET BY MOUTH EVERY DAY FOR BLOOD PRESSURE 30 tablet 3  . CALCIUM PLUS VITAMIN D 600-100 MG-UNIT PO CAPS         BP 118/63  Pulse 86  Temp(Src) 98.8 F (37.1 C) (Oral)  Resp 19  SpO2 99%  Physical Exam  Nursing note and vitals reviewed. Constitutional: She is oriented to person, place, and time. She appears well-developed and well-nourished. No distress.  HENT:  Head: Normocephalic and atraumatic.  Eyes: Conjunctivae are normal. Pupils are equal, round, and reactive to light.  Neck: Normal range of motion. Neck supple.  Cardiovascular: Normal rate, regular rhythm and intact distal pulses.   Pulmonary/Chest: Effort normal. No respiratory distress. She has no wheezes. She has no rales.  Abdominal: Soft. There is no tenderness. There is no rebound.  Neurological: She is alert and oriented to person, place, and time. No cranial nerve deficit.    ED Course  Procedures (including critical care time)  Labs Reviewed - No data to display No results found.  1. Bronchitis       MDM  Pt presented due to cough, productive, for 3 weeks.  No fever, no chills. Reports wheezing at home.  Hx of asthma.  No relief with albuterol.  Lungs are clear here.  She is well appearing no distress.  Will treat for bronchitis/asthma exacerbation with steriods and zithromax.  Will d/c home.          Nena Alexander, MD 04/09/11 [phone]

## 2011-04-09 NOTE — ED Provider Notes (Signed)
Medical screening examination/treatment/procedure(s) were conducted as a shared visit with non-physician practitioner(s) and myself.  I personally evaluated the patient during the encounter  Cough for 2-3 weeks. Has some associated shortness of breath and wheezing. No chest pain, fever. Tried over-the-counter remedies without improvement. Albuterol prior to arrival.  Lungs clear on auscultation bilaterally. There is no signs of respiratory distress. Heart regular rate and rhythm.  No indication for chest x-ray at this time she'll be treated for bronchitis. Instructed to followup with her primary care physician  Dayton Bailiff, MD 04/09/11 2340

## 2011-04-09 NOTE — ED Notes (Signed)
Pt states that she had an asthma attack earlier this evening while working. Used her inhaler with little relief. Pt currently sts that she's feeling better. Pt AAOx3. resp e/u. NAD.

## 2011-06-10 ENCOUNTER — Telehealth: Payer: Self-pay | Admitting: Family Medicine

## 2011-06-10 ENCOUNTER — Ambulatory Visit (INDEPENDENT_AMBULATORY_CARE_PROVIDER_SITE_OTHER): Payer: Self-pay | Admitting: Family Medicine

## 2011-06-10 ENCOUNTER — Encounter: Payer: Self-pay | Admitting: Family Medicine

## 2011-06-10 DIAGNOSIS — J45901 Unspecified asthma with (acute) exacerbation: Secondary | ICD-10-CM | POA: Insufficient documentation

## 2011-06-10 DIAGNOSIS — J45909 Unspecified asthma, uncomplicated: Secondary | ICD-10-CM | POA: Insufficient documentation

## 2011-06-10 MED ORDER — PREDNISONE 50 MG PO TABS: ORAL_TABLET | ORAL | Status: AC

## 2011-06-10 MED ORDER — IPRATROPIUM BROMIDE 0.02 % IN SOLN
0.5000 mg | Freq: Once | RESPIRATORY_TRACT | Status: AC
  Administered 2011-06-10: 0.5 mg via RESPIRATORY_TRACT

## 2011-06-10 MED ORDER — ALBUTEROL SULFATE (2.5 MG/3ML) 0.083% IN NEBU
2.5000 mg | INHALATION_SOLUTION | Freq: Once | RESPIRATORY_TRACT | Status: AC
  Administered 2011-06-10: 2.5 mg via RESPIRATORY_TRACT

## 2011-06-10 MED ORDER — AZITHROMYCIN 250 MG PO TABS: ORAL_TABLET | ORAL | Status: DC

## 2011-06-10 MED ORDER — ALBUTEROL SULFATE HFA 108 (90 BASE) MCG/ACT IN AERS: 2.0000 | INHALATION_SPRAY | RESPIRATORY_TRACT | Status: DC | PRN

## 2011-06-10 MED ORDER — METHYLPREDNISOLONE SODIUM SUCC 125 MG IJ SOLR
125.0000 mg | Freq: Once | INTRAMUSCULAR | Status: AC
  Administered 2011-06-10: 125 mg via INTRAMUSCULAR

## 2011-06-10 MED ORDER — BECLOMETHASONE DIPROPIONATE 40 MCG/ACT IN AERS: 2.0000 | INHALATION_SPRAY | Freq: Two times a day (BID) | RESPIRATORY_TRACT | Status: DC

## 2011-06-10 NOTE — Progress Notes (Signed)
  Subjective:    Patient ID: Karen Ferguson, female    DOB: 1955/12/04, 56 y.o.   MRN: [id number]  HPI  1.  Asthma exacerbation:  Had recent ED visit about one month ago diagnosed with asthma exacerbation bronchitis at that time. She was given several albuterol treatments as well as steroids. She initially recovered in about 2 weeks ago began having worsening cough, nasal congestion. This has been worsening to the point she is needed to use her inhaler 4 times a day for the past 2-3 days. Her daughter also has asthma and has a nebulizer at home. She used two nebulized treatments last night without much improvement. Denies any fevers or chills. Cough worse at night time. Basically described as nonproductive hacking cough. As noted above is complaining of nasal drainage and sinus congestion for the past 2 weeks or so.    Review of Systems See HPI above for review of systems.       Objective:   Physical Exam Gen:  Alert, cooperative patient who appears stated age in no acute distress.  Vital signs reviewed.  Speaking in full sentences, no increased work of breathing. HEENT:  [place]/AT.  EOMI, PERRL.  Nasal turbinates grossly inflamed bilaterally, erythmatous appearing. MMM, tonsils non-erythematous, non-edematous.  External ears WNL, Bilateral TM's normal without retraction, redness or bulging.  Neck: No cervical lymphadenopathy Lungs diffuse wheezing throughout all lung fields. Prolonged expiratory phase. Cardiac:  Regular rate and rhythm without murmur auscultated.  Good S1/S2. Ext:  No clubbing/cyanosis/erythema.  No edema noted bilateral lower extremities.     Lungs s/p nebulized treatment:  Still with diffuse wheezing but greatly improved, especially in upper airways.  Prolonged expiratory phase persists.       Assessment & Plan:

## 2011-06-10 NOTE — Assessment & Plan Note (Signed)
Denies ever smoking. Provided albuterol plus Atrovent nebulized treatment in office.   Much improved exam after treatment. Plan to prescribe five-day course of steroids, azithromycin due to 2 weeks of worsening nasal congestion and drainage. We'll also provide one refill of albuterol inhaler. She is to follow up with her PCP in 2 to 4 weeks as she seems to be having worsening asthma exacerbations. May need long-acting inhaler to prevent further exacerbations.

## 2011-06-10 NOTE — Patient Instructions (Signed)
Take the prednisone one pill daily with meals. Take the azithromycin (antibiotic) 2 tablets the first day one tab daily after that. Usual albuterol inhaler 24 times a day as needed for shortness of breath.

## 2011-06-10 NOTE — Telephone Encounter (Signed)
Pt states that she cannot afford meds - she is getting her orange card tomorrow morning and wants to have the meds sent to the [facility].

## 2011-06-10 NOTE — Telephone Encounter (Signed)
Returned call to patient.  Was seen today and meds were sent electronically to [facility]  Patient unable to pay $157 and wants to know if we can switch meds to [facility] pharmacy.  Contacted [facility] ([place]) and they will obtain meds from [facility] when patient gets her orange card tomorrow.  They will be able to fill Abx and prednisone.  Will not be able to fill inhalers since they were removed from their formulary.  Patient will have to apply through MAP program and it can take 6-8 weeks to receive med.  Will inform Dr. Gwendolyn Grant.  Gaylene Brooks, RN

## 2011-06-12 NOTE — Telephone Encounter (Addendum)
Patient states she has picked up all her Rx from [facility] except inhaler because they do not have this.. .   Dr. Gwendolyn Grant had already sent  rx for inhaler  to [facility] and patient will call them to see if she can afford.

## 2011-06-12 NOTE — Telephone Encounter (Signed)
Would recommend patient fill all meds at [facility] except what is not on pharmacy.  Will await her input and see if this works.  I will send in her meds where she wishes.

## 2011-07-17 ENCOUNTER — Ambulatory Visit (INDEPENDENT_AMBULATORY_CARE_PROVIDER_SITE_OTHER): Payer: Self-pay | Admitting: Family Medicine

## 2011-07-17 ENCOUNTER — Encounter: Payer: Self-pay | Admitting: Family Medicine

## 2011-07-17 DIAGNOSIS — E785 Hyperlipidemia, unspecified: Secondary | ICD-10-CM

## 2011-07-17 DIAGNOSIS — G609 Hereditary and idiopathic neuropathy, unspecified: Secondary | ICD-10-CM

## 2011-07-17 DIAGNOSIS — M549 Dorsalgia, unspecified: Secondary | ICD-10-CM

## 2011-07-17 DIAGNOSIS — R209 Unspecified disturbances of skin sensation: Secondary | ICD-10-CM

## 2011-07-17 DIAGNOSIS — G629 Polyneuropathy, unspecified: Secondary | ICD-10-CM

## 2011-07-17 LAB — CBC WITH DIFFERENTIAL/PLATELET
Eosinophils Absolute: 0.2 10*3/uL (ref 0.0–0.7)
Eosinophils Relative: 3 % (ref 0–5)
Hemoglobin: 13 g/dL (ref 12.0–15.0)
Lymphocytes Relative: 40 % (ref 12–46)
Lymphs Abs: 2.9 10*3/uL (ref 0.7–4.0)
MCH: 31.1 pg (ref 26.0–34.0)
MCV: 95.5 fL (ref 78.0–100.0)
Monocytes Relative: 9 % (ref 3–12)
Platelets: 313 10*3/uL (ref 150–400)
RBC: 4.18 MIL/uL (ref 3.87–5.11)
WBC: 7.2 10*3/uL (ref 4.0–10.5)

## 2011-07-17 NOTE — Progress Notes (Signed)
Pt states that she has been having leg pain for 15-20 years, she told me that she fell two nights ago when trying to get out of the bed. Pt also complains of tingling,numbness, and cold in her legs especially at night, thinks that she may have restless leg syndrome. No personal hx of DM but mother is a diabetic.

## 2011-07-17 NOTE — Patient Instructions (Signed)
It was nice to see you today. We will do some blood work, and then make a plan for the pain after we have the results. Let us know if you have any questions.

## 2011-07-18 ENCOUNTER — Telehealth: Payer: Self-pay | Admitting: Family Medicine

## 2011-07-18 DIAGNOSIS — J45901 Unspecified asthma with (acute) exacerbation: Secondary | ICD-10-CM

## 2011-07-18 DIAGNOSIS — I10 Essential (primary) hypertension: Secondary | ICD-10-CM

## 2011-07-18 LAB — COMPLETE METABOLIC PANEL WITH GFR
ALT: 15 U/L (ref 0–35)
AST: 19 U/L (ref 0–37)
Alkaline Phosphatase: 66 U/L (ref 39–117)
CO2: 33 mEq/L — ABNORMAL HIGH (ref 19–32)
Creat: 0.77 mg/dL (ref 0.50–1.10)
Sodium: 142 mEq/L (ref 135–145)
Total Bilirubin: 0.5 mg/dL (ref 0.3–1.2)
Total Protein: 7.1 g/dL (ref 6.0–8.3)

## 2011-07-18 LAB — RPR

## 2011-07-18 MED ORDER — HYDROCHLOROTHIAZIDE 25 MG PO TABS: 25.0000 mg | ORAL_TABLET | Freq: Every day | ORAL | Status: DC

## 2011-07-18 MED ORDER — ALBUTEROL SULFATE HFA 108 (90 BASE) MCG/ACT IN AERS: 2.0000 | INHALATION_SPRAY | RESPIRATORY_TRACT | Status: DC | PRN

## 2011-07-18 NOTE — Assessment & Plan Note (Signed)
Will check labs for peripheral neuropathy.  If no obvious treatable cause, will try symptom management with increased gabapentin or possibly amitryptiline. Follow up when results available.

## 2011-07-18 NOTE — Telephone Encounter (Signed)
Dr. Swaziland, Spoke with patient and was informed that rx was not received by [facility]. It looks like a rx was not put in for these scripts. -----Huntley Dec

## 2011-07-18 NOTE — Telephone Encounter (Signed)
Refill for BP medication and daily laxative were to be sent to [facility] yesterday after her appt, but they have not been received yet.  She would like to speak to someone about this.

## 2011-07-18 NOTE — Progress Notes (Signed)
  Subjective:    Patient ID: Karen Ferguson, female    DOB: 04-Aug-1955, 56 y.o.   MRN: [id number]  HPI 52 you female here for follow up of leg pain.  Reports her legs feel burning, achy and hurt.  Also feels like her legs have to move, like "restless leg".  This is worse at night.  She has had this pain for a year.  She tried gabpentin without relief ( up to 300 TID)Pain equal in both legs.  Feel like the muscles are "drawing" but denies calf cramps.  Pain is worse when she sits.   She also has knee pain and DJD.  No relief with injection.   She also has chronic back pain.  This has been present for years.  She denies incontinence or saddle anesthesia. She would like a laxative. She does not smoke.   Review of SystemsSee HPI     Objective:   Physical Exam  Nursing note and vitals reviewed. Constitutional: No distress.       Obese.  Musculoskeletal:       BLE without swelling, warmth, or erythema. DP 2 + B.  Sensation intact to light touch.  No muscle atrophy.  Nl gait.  Skin: She is not diaphoretic.          Assessment & Plan:

## 2011-07-22 ENCOUNTER — Telehealth: Payer: Self-pay | Admitting: Family Medicine

## 2011-07-22 MED ORDER — GABAPENTIN 300 MG PO CAPS: ORAL_CAPSULE | ORAL | Status: DC

## 2011-07-22 NOTE — Telephone Encounter (Signed)
Could you let pt know that her labs were normal.  We should try the gabapentin another try.  I know it did not work for her pain in the past, but maybe we did not get the dose high enough.  I wrote a prescription - could you fax to the health dept?

## 2011-07-22 NOTE — Telephone Encounter (Signed)
Will forward to Dr. Jordan  

## 2011-07-22 NOTE — Telephone Encounter (Signed)
Pt is asking for results of her labs - her feet and legs are still hurting and wants to know if she could get meds for this.

## 2011-07-24 ENCOUNTER — Other Ambulatory Visit: Payer: Self-pay | Admitting: Family Medicine

## 2011-07-24 DIAGNOSIS — J45901 Unspecified asthma with (acute) exacerbation: Secondary | ICD-10-CM

## 2011-07-24 DIAGNOSIS — I10 Essential (primary) hypertension: Secondary | ICD-10-CM

## 2011-07-24 MED ORDER — ALBUTEROL SULFATE HFA 108 (90 BASE) MCG/ACT IN AERS: 2.0000 | INHALATION_SPRAY | RESPIRATORY_TRACT | Status: DC | PRN

## 2011-07-24 MED ORDER — BECLOMETHASONE DIPROPIONATE 40 MCG/ACT IN AERS: 2.0000 | INHALATION_SPRAY | Freq: Two times a day (BID) | RESPIRATORY_TRACT | Status: DC

## 2011-07-24 MED ORDER — GABAPENTIN 300 MG PO CAPS: ORAL_CAPSULE | ORAL | Status: DC

## 2011-07-24 MED ORDER — HYDROCHLOROTHIAZIDE 25 MG PO TABS: 25.0000 mg | ORAL_TABLET | Freq: Every day | ORAL | Status: DC

## 2011-07-24 NOTE — Telephone Encounter (Signed)
Spoke with pt.  She did not know she could go get the back xrays; she will go tomorrow.  She is willing to try the gabapentin and will pick it up tomorrow.  SHe has called the health department pharmacy - she says they do not have prescription refills for her.  I will try to print and fax those again today. Follow up after a trial of the gabapentin.

## 2011-07-24 NOTE — Telephone Encounter (Signed)
Called and informed pt of Rx. Pt stated that she would like to see a specialist about her feet and legs because she has been here several times concerning the same thing and does not understand why or how taking a medication will help this. I told her that I would forward her concerns to Dr. Swaziland.[name] [place]

## 2011-08-02 ENCOUNTER — Ambulatory Visit (HOSPITAL_COMMUNITY)
Admission: RE | Admit: 2011-08-02 | Discharge: 2011-08-02 | Disposition: A | Discharge status: Home / Self Care | Payer: Self-pay | Source: Ambulatory Visit | Attending: Family Medicine | Admitting: Family Medicine

## 2011-08-02 DIAGNOSIS — M549 Dorsalgia, unspecified: Secondary | ICD-10-CM

## 2011-08-02 DIAGNOSIS — M51379 Other intervertebral disc degeneration, lumbosacral region without mention of lumbar back pain or lower extremity pain: Secondary | ICD-10-CM | POA: Insufficient documentation

## 2011-08-02 DIAGNOSIS — M5137 Other intervertebral disc degeneration, lumbosacral region: Secondary | ICD-10-CM | POA: Insufficient documentation

## 2011-08-04 ENCOUNTER — Telehealth: Payer: Self-pay | Admitting: Family Medicine

## 2011-08-04 NOTE — Telephone Encounter (Signed)
Findings: Five non-rib bearing lumbar type vertebra are again  identified in normal alignment.  There is no evidence of fracture or subluxation.  Minimal multilevel degenerative disc disease is noted.  Sclerosis along the right SI joint is noted and may represent  sacroiliitis.  No other focal bony abnormalities are present.  Called and gave previous information to pt. She is asking for a copy of her results. I told her that I would need to forward this to Dr. Swaziland and if we can give her these results I will call her and let her know that she can pick them up.[name] [place]

## 2011-08-04 NOTE — Telephone Encounter (Signed)
Patient is calling for results from Xray.  She said it is ok to leave it on voicemail.

## 2011-08-05 ENCOUNTER — Other Ambulatory Visit: Payer: Self-pay | Admitting: Family Medicine

## 2011-08-05 ENCOUNTER — Telehealth: Payer: Self-pay | Admitting: *Deleted

## 2011-08-05 MED ORDER — GABAPENTIN 300 MG PO CAPS: ORAL_CAPSULE | ORAL | Status: DC

## 2011-08-05 NOTE — Telephone Encounter (Signed)
Patient is calling because she would like Thekla to explain the results of her Xray again.

## 2011-08-05 NOTE — Telephone Encounter (Signed)
Patient is calling back asking for a refill on Gabapentin to be sent to [facility] on [address].

## 2011-08-05 NOTE — Telephone Encounter (Signed)
-----   Message from Sarah T Swaziland, MD sent at 08/05/2011 1:39 PM -----  I sent the rx to [facility]. If she wants the actual films, she can request those from radiology at the hospital. If she just wants the reports, we could probably just give her a copy.

## 2011-08-05 NOTE — Telephone Encounter (Signed)
error 

## 2011-08-05 NOTE — Telephone Encounter (Signed)
Dr. Swaziland, Spoke with patient and informed her of results again. She would like Gabapentin rx. She states that [facility] did not fill for her or has she had it filled anywhere at all. Would like to get this filled

## 2011-08-18 ENCOUNTER — Telehealth: Payer: Self-pay | Admitting: Family Medicine

## 2011-08-18 DIAGNOSIS — M1990 Unspecified osteoarthritis, unspecified site: Secondary | ICD-10-CM

## 2011-08-18 NOTE — Telephone Encounter (Signed)
Would like to be referred to doctor for her deterioration of her back and arthritis  - also for carpal tunnel

## 2011-08-19 NOTE — Telephone Encounter (Signed)
Called back to discuss.  Left message.

## 2011-08-21 NOTE — Telephone Encounter (Signed)
Pt returned call  After 3pm is [phone]

## 2011-08-21 NOTE — Telephone Encounter (Signed)
I spoke with pt.  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  Did not go to work today because she feels so bad.  Pain meds not helping. Her mother has RA.  Given multiple joint involvement, will check ESR and then consider referral to either ortho or rheum, depending on results. Kendal Hymen- could she come for a lab appt on Tuesday at 9 am?

## 2011-08-30 ENCOUNTER — Other Ambulatory Visit: Payer: Self-pay | Admitting: Family Medicine

## 2011-09-08 ENCOUNTER — Telehealth: Payer: Self-pay | Admitting: Family Medicine

## 2011-09-08 NOTE — Telephone Encounter (Signed)
Patient is calling wanting a refill on the medication that was prescribed a couple of months ago for Bronchitis.  She says she is going through another bout of it and doesn't want to have to be seen.  I let her know that I would pass the message through but that she most likely will not prescribe anything without the patient being seen first.  She is asking for Dr. Swaziland to call her tomorrow morning.

## 2011-09-08 NOTE — Telephone Encounter (Signed)
I really can't prescribe antibiotics without seeing her.  I will route on [facility] for triage because I will be taking my boards tomorrow and unable to call in the morning.  Larita Fife, would you have a chance to call Karen Ferguson and help figure out if she would like a same day appt? Thanks!

## 2011-09-09 ENCOUNTER — Encounter: Payer: Self-pay | Admitting: Family Medicine

## 2011-09-09 ENCOUNTER — Ambulatory Visit (INDEPENDENT_AMBULATORY_CARE_PROVIDER_SITE_OTHER): Payer: Self-pay | Admitting: Family Medicine

## 2011-09-09 VITALS — BP 126/70 | HR 80 | Temp 98.9°F | Ht 65.0 in | Wt 209.0 lb

## 2011-09-09 DIAGNOSIS — J069 Acute upper respiratory infection, unspecified: Secondary | ICD-10-CM

## 2011-09-09 DIAGNOSIS — I10 Essential (primary) hypertension: Secondary | ICD-10-CM

## 2011-09-09 DIAGNOSIS — J45901 Unspecified asthma with (acute) exacerbation: Secondary | ICD-10-CM

## 2011-09-09 DIAGNOSIS — J45909 Unspecified asthma, uncomplicated: Secondary | ICD-10-CM

## 2011-09-09 MED ORDER — ALBUTEROL SULFATE HFA 108 (90 BASE) MCG/ACT IN AERS: 2.0000 | INHALATION_SPRAY | RESPIRATORY_TRACT | Status: DC | PRN

## 2011-09-09 NOTE — Telephone Encounter (Signed)
Spoke with patient and she reports cough, congestion, productive cough with yellow sputum, bloody nasal secretions this AM. No fever. Appointment scheduled this afternoon.

## 2011-09-09 NOTE — Assessment & Plan Note (Signed)
well controlled  

## 2011-09-09 NOTE — Assessment & Plan Note (Signed)
Not using a spacer with her MDI so gave her an Aerochamber. If she develops more significant wheezing she is to return for consideration of Prenisone burst.

## 2011-09-09 NOTE — Assessment & Plan Note (Addendum)
No sign of pneumonia or bronchitis Loratadine for nasal congestion and possible allergy component

## 2011-09-09 NOTE — Progress Notes (Signed)
  Subjective:    Patient ID: Karen Ferguson, female    DOB: October 28, 1955, 56 y.o.   MRN: [id number]  HPI 3 days of nasal congestion, sore throat and hoarseness (the latter resolved) Some cough, but no current wheezing or fever. Used Albuterol an hour ago. Her daughter and grand daughter have respiratory infections.   Has Albuterol, but no spacer. 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    Review of Systems  HENT: Positive for congestion. Negative for ear pain.   Cardiovascular: Positive for chest pain.  Gastrointestinal: Positive for vomiting. Negative for nausea, abdominal pain and diarrhea.       Objective:   Physical Exam  Constitutional:       Generalized obesity  HENT:  Right Ear: External ear normal.  Left Ear: External ear normal.  Mouth/Throat: Oropharynx is clear and moist. No oropharyngeal exudate.       Nose congested   Eyes: Conjunctivae are normal.  Cardiovascular: Normal rate and regular rhythm.   Pulmonary/Chest: Effort normal and breath sounds normal. No respiratory distress. She has no wheezes. She has no rales.  Musculoskeletal: She exhibits no edema.  Lymphadenopathy:    She has no cervical adenopathy.          Assessment & Plan:

## 2011-09-09 NOTE — Patient Instructions (Addendum)
Currently you don't have wheezing in your chest. Use the Albuterol with the spacer when you do wheeze. Use the QVAR for prevention of asthma attacks.   Get Loratadine to take for nasal congestion and sneezing   Take Acetaminophen for sore throat and head ache.   Come back for recheck if you develop severe wheezing or a fever over 101.6

## 2011-09-10 ENCOUNTER — Telehealth: Payer: Self-pay | Admitting: Family Medicine

## 2011-09-10 MED ORDER — DOXYCYCLINE HYCLATE 100 MG PO TABS: 100.0000 mg | ORAL_TABLET | Freq: Two times a day (BID) | ORAL | Status: AC

## 2011-09-10 NOTE — Telephone Encounter (Signed)
Attempted call again, message left.  Will send message to Dr. Sheffield Slider while awaiting call back from patient.

## 2011-09-10 NOTE — Telephone Encounter (Signed)
Message left to return call.

## 2011-09-10 NOTE — Telephone Encounter (Signed)
Let her know that I sent Doxycycline in to her pharmacy for the chance that it is not entirely a viral illness.

## 2011-09-10 NOTE — Telephone Encounter (Signed)
Message left on voicemail that Dr. Sheffield Slider has sent in antibiotic.

## 2011-09-10 NOTE — Telephone Encounter (Signed)
Pt was here yesterday - coughing up green/yellowish mucus and feels like she needs something besides an inhaler.  Needs to talk to nurse

## 2011-09-24 ENCOUNTER — Emergency Department (HOSPITAL_COMMUNITY)
Admission: EM | Admit: 2011-09-24 | Discharge: 2011-09-25 | Disposition: A | Discharge status: Home / Self Care | Payer: Self-pay | Attending: Emergency Medicine | Admitting: Emergency Medicine

## 2011-09-24 ENCOUNTER — Encounter (HOSPITAL_COMMUNITY): Payer: Self-pay | Admitting: Emergency Medicine

## 2011-09-24 ENCOUNTER — Emergency Department (HOSPITAL_COMMUNITY): Payer: Self-pay

## 2011-09-24 DIAGNOSIS — B9789 Other viral agents as the cause of diseases classified elsewhere: Secondary | ICD-10-CM | POA: Insufficient documentation

## 2011-09-24 DIAGNOSIS — R05 Cough: Secondary | ICD-10-CM | POA: Insufficient documentation

## 2011-09-24 DIAGNOSIS — R0989 Other specified symptoms and signs involving the circulatory and respiratory systems: Secondary | ICD-10-CM | POA: Insufficient documentation

## 2011-09-24 DIAGNOSIS — IMO0002 Reserved for concepts with insufficient information to code with codable children: Secondary | ICD-10-CM | POA: Insufficient documentation

## 2011-09-24 DIAGNOSIS — R51 Headache: Secondary | ICD-10-CM | POA: Insufficient documentation

## 2011-09-24 DIAGNOSIS — J40 Bronchitis, not specified as acute or chronic: Secondary | ICD-10-CM | POA: Insufficient documentation

## 2011-09-24 DIAGNOSIS — B349 Viral infection, unspecified: Secondary | ICD-10-CM

## 2011-09-24 DIAGNOSIS — R059 Cough, unspecified: Secondary | ICD-10-CM | POA: Insufficient documentation

## 2011-09-24 DIAGNOSIS — I10 Essential (primary) hypertension: Secondary | ICD-10-CM | POA: Insufficient documentation

## 2011-09-24 HISTORY — DX: Reserved for concepts with insufficient information to code with codable children: IMO0002

## 2011-09-24 HISTORY — DX: Bronchitis, not specified as acute or chronic: J40

## 2011-09-24 LAB — URINALYSIS, ROUTINE W REFLEX MICROSCOPIC
Glucose, UA: NEGATIVE mg/dL
Protein, ur: 100 mg/dL — AB
Specific Gravity, Urine: 1.027 (ref 1.005–1.030)
Urobilinogen, UA: 1 mg/dL (ref 0.0–1.0)

## 2011-09-24 LAB — URINE MICROSCOPIC-ADD ON

## 2011-09-24 MED ORDER — ACETAMINOPHEN 325 MG PO TABS
ORAL_TABLET | ORAL | Status: AC
  Filled 2011-09-24: qty 2

## 2011-09-24 MED ORDER — ACETAMINOPHEN 325 MG PO TABS
650.0000 mg | ORAL_TABLET | Freq: Once | ORAL | Status: AC
  Administered 2011-09-24: 650 mg via ORAL

## 2011-09-24 NOTE — ED Notes (Signed)
EDP at [facility] 

## 2011-09-24 NOTE — ED Notes (Signed)
Patient complaining of headache, cold chills, fever, cough, and body aches since last night.  Patient denies being around anyone that is sick; patient states that she has had the flu shot this year.  Patient denies shortness of breath and chest pain.

## 2011-09-24 NOTE — ED Provider Notes (Signed)
History     CSN: [id number]  Arrival date & time 09/24/11  2006   First MD Initiated Contact with Patient 09/24/11 2134      Chief Complaint  Patient presents with  . Chills  . Headache    (Consider location/radiation/quality/duration/timing/severity/associated sxs/prior treatment) HPI Patient is a 56 year old female with a past medical history of asthma, hypertension, bronchitis who presents the emergency department with a chief complaint of viral symptoms that worsened in onset last night. Symptoms include a mild headache over the bridge of the nose, fever and chills, a wet but nonproductive cough, and a mild sore throat. She denies any change in vision, tinnitus, ear pain, neck pain or stiffness, chest pain, shortness of breath, wheezing, abdominal pain, nausea or vomiting, change in bowel habits, head injury, dizziness or lightheadedness, rash. Nothing makes her symptoms better or worse. She did not try any home treatment prior to presenting to the emergency department. She denies any known sick contacts. She did receive a flu shot this year.  Past Medical History  Diagnosis Date  . Arthritis   . Depression   . Hypertension   . Asthma   . Bronchitis   . Degenerative disc disease     Past Surgical History  Procedure Date  . Abdominal hysterectomy   . Cholecystectomy   . Carpal tunnel release   . Hemorrhoid surgery     Family History  Problem Relation Age of Onset  . Arthritis Mother   . Arthritis Sister     History  Substance Use Topics  . Smoking status: Never Smoker   . Smokeless tobacco: Not on file  . Alcohol Use: No     Review of Systems 10 systems reviewed and are negative for acute change except as noted in the HPI.  Allergies  Review of patient's allergies indicates no known allergies.  Home Medications   Current Outpatient Rx  Name Route Sig Dispense Refill  . ACETAMINOPHEN 500 MG PO TABS Oral Take 1,000 mg by mouth every 6 (six) hours as  needed.    . ALBUTEROL SULFATE HFA 108 (90 BASE) MCG/ACT IN AERS Inhalation Inhale 2 puffs into the lungs every 4 (four) hours as needed for wheezing. 1 Inhaler 11  . CALCIUM PLUS VITAMIN D 600-100 MG-UNIT PO CAPS       . VITAMIN D 1000 UNITS PO TABS Oral Take 1,000 Units by mouth daily.    Marland Kitchen HYDROCHLOROTHIAZIDE 25 MG PO TABS Oral Take 1 tablet (25 mg total) by mouth daily. 90 tablet 3  . LORATADINE 10 MG PO TABS Oral Take 10 mg by mouth daily.      BP 112/53  Pulse 91  Temp(Src) 98.8 F (37.1 C) (Oral)  Resp 18  SpO2 94%  Physical Exam Vital signs are reviewed and are significant for fever. Negative for tachycardia or hypoxia. Nursing notes are reviewed.  Patient is well-developed, well-nourished, mildly uncomfortable appearing. Head is normocephalic and atraumatic. Bilateral external ears, ear canals, and tympanic membranes are normal. Nose normal. Oropharynx is difficult to visualize but appears mildly edematous without erythema or exudate. Oral mucosa are moist. Pupils are equal, round, reactive to light. Conjunctiva are normal. Neck is supple without cervical adenopathy. No meningismus. Heart regular rate and rhythm. No murmurs auscultated. Bilateral radial and dorsalis pedis pulses are 2+. Normal respiratory effort and excursion. There is faint end expiratory wheezing heard in bilateral upper lung fields. Good air movement. No chest wall tenderness. Abdomen soft, nontender nondistended. Bowel sounds are  normal. Extremities without edema. Patient alert and oriented x3. Cranial nerves III through XII are tested and are intact. Finger to nose is intact bilaterally. Speech is clear. Gait is normal. Romberg negative. Moves all extremities well with 5/5 and symmetric bilaterally strength to all major muscle groups. Skin is warm and dry without any obvious rash or lesion.  ED Course  Procedures (including critical care time)  Labs Reviewed  URINALYSIS, ROUTINE W REFLEX MICROSCOPIC -  Abnormal; Notable for the following:    APPearance CLOUDY (*)    Hgb urine dipstick LARGE (*)    Protein, ur 100 (*)    Leukocytes, UA SMALL (*)    All other components within normal limits  URINE MICROSCOPIC-ADD ON - Abnormal; Notable for the following:    Squamous Epithelial / LPF MANY (*)    Crystals CA OXALATE CRYSTALS (*)    All other components within normal limits  RAPID STREP SCREEN   Dg Chest 2 View  09/24/2011  *RADIOLOGY REPORT*  Clinical Data: Headache and congestion  CHEST - 2 VIEW  Comparison: None.  Findings: Normal mediastinum and heart silhouette.  Costophrenic angles are clear.  No effusion, infiltrate, pneumothorax. Degenerative osteophytosis of the thoracic spine.  IMPRESSION: No acute cardiopulmonary findings.  Original Report Authenticated By: Genevive Bi, M.D.     Dx 1: Viral illness   MDM  Testing results are reviewed. I personally reviewed the chest x-ray images. No acute findings. Physically, no evidence of pneumonia. Urinalysis demonstrates no evidence of urinary tract infection. Hematuria without any flank pain to suggest kidney stone. This is not further investigated. Rapid strep screen is negative and given the lack of cervical adenopathy, lack of pharyngeal exudate, and presence of cough I feel the test is accurate and a DNA culture is not ordered. I discussed with the patient the course of a viral illness, as her symptoms have only been present for one day. She was advised to followup with her primary Dr. if her symptoms do not improve in the next 3-4 days.        Shaaron Adler, PA-C 09/25/11 0006

## 2011-09-24 NOTE — ED Notes (Signed)
Pt of [facility]. Has been seen recently and tx'd for bronchitis at [facility], states has had cough for weeks/months. Developed new sx last night. C/o chills, fever (subjective), HA, body aches, onset last night, also reports chronic and acute urinary sx. Family h/o urinary sx (blood in urine), (denies: CP, sob, abd pain, back pain or nvd).

## 2011-09-25 NOTE — Discharge Instructions (Signed)
Viral Infections A virus is a type of germ. Viruses can cause:  Minor sore throats.   Aches and pains.   Headaches.   Runny nose.   Rashes.   Watery eyes.   Tiredness.   Coughs.   Loss of appetite.   Feeling sick to your stomach (nausea).   Throwing up (vomiting).   Watery poop (diarrhea).  HOME CARE   Only take medicines as told by your doctor.   Drink enough water and fluids to keep your pee (urine) clear or pale yellow. Sports drinks are a good choice.   Get plenty of rest and eat healthy. Soups and broths with crackers or rice are fine.  GET HELP RIGHT AWAY IF:   You have a very bad headache.   You have shortness of breath.   You have chest pain or neck pain.   You have an unusual rash.   You cannot stop throwing up.   You have watery poop that does not stop.   You cannot keep fluids down.   You or your child has a temperature by mouth above 102 F (38.9 C), not controlled by medicine.   Your baby is older than 3 months with a rectal temperature of 102 F (38.9 C) or higher.   Your baby is 75 months old or younger with a rectal temperature of 100.4 F (38 C) or higher.  MAKE SURE YOU:   Understand these instructions.   Will watch this condition.   Will get help right away if you are not doing well or get worse.  Document Released: 04/24/2008 Document Revised: 05/01/2011 Document Reviewed: 09/17/2010 [facility] Patient Information 2012 [place], [place].            Antibiotic Nonuse  Your caregiver felt that the infection or problem was not one that would be helped with an antibiotic. Infections may be caused by viruses or bacteria. Only a caregiver can tell which one of these is the likely cause of an illness. A cold is the most common cause of infection in both adults and children. A cold is a virus. Antibiotic treatment will have no effect on a viral infection. Viruses can lead to many lost days of work caring for sick children and many  missed days of school. Children may catch as many as 10 "colds" or "flus" per year during which they can be tearful, cranky, and uncomfortable. The goal of treating a virus is aimed at keeping the ill person comfortable. Antibiotics are medications used to help the body fight bacterial infections. There are relatively few types of bacteria that cause infections but there are hundreds of viruses. While both viruses and bacteria cause infection they are very different types of germs. A viral infection will typically go away by itself within 7 to 10 days. Bacterial infections may spread or get worse without antibiotic treatment. Examples of bacterial infections are:  Sore throats (like strep throat or tonsillitis).   Infection in the lung (pneumonia).   Ear and skin infections.  Examples of viral infections are:  Colds or flus.   Most coughs and bronchitis.   Sore throats not caused by Strep.   Runny noses.  It is often best not to take an antibiotic when a viral infection is the cause of the problem. Antibiotics can kill off the helpful bacteria that we have inside our body and allow harmful bacteria to start growing. Antibiotics can cause side effects such as allergies, nausea, and diarrhea without helping to improve  the symptoms of the viral infection. Additionally, repeated uses of antibiotics can cause bacteria inside of our body to become resistant. That resistance can be passed onto harmful bacterial. The next time you have an infection it may be harder to treat if antibiotics are used when they are not needed. Not treating with antibiotics allows our own immune system to develop and take care of infections more efficiently. Also, antibiotics will work better for [place] when they are prescribed for bacterial infections. Treatments for a child that is ill may include:  Give extra fluids throughout the day to stay hydrated.   Get plenty of rest.   Only give your child over-the-counter or  prescription medicines for pain, discomfort, or fever as directed by your caregiver.   The use of a cool mist humidifier may help stuffy noses.   Cold medications if suggested by your caregiver.  Your caregiver may decide to start you on an antibiotic if:  The problem you were seen for today continues for a longer length of time than expected.   You develop a secondary bacterial infection.  SEEK MEDICAL CARE IF:  Fever lasts longer than 5 days.   Symptoms continue to get worse after 5 to 7 days or become severe.   Difficulty in breathing develops.   Signs of dehydration develop (poor drinking, rare urinating, dark colored urine).   Changes in behavior or worsening tiredness (listlessness or lethargy).  Document Released: 07/21/2001 Document Revised: 05/01/2011 Document Reviewed: 01/17/2009 [facility] Patient Information 2012 [place], [place].

## 2011-09-25 NOTE — ED Provider Notes (Signed)
Medical screening examination/treatment/procedure(s) were conducted as a shared visit with non-physician practitioner(s) and myself.  I personally evaluated the patient during the encounter.  No clinical evidence of meningitis. History and physical most consistent with viral syndrome  Donnetta Hutching, MD 09/25/11 2246

## 2011-09-26 ENCOUNTER — Encounter: Payer: Self-pay | Admitting: Family Medicine

## 2011-09-26 ENCOUNTER — Ambulatory Visit (INDEPENDENT_AMBULATORY_CARE_PROVIDER_SITE_OTHER): Payer: Self-pay | Admitting: Family Medicine

## 2011-09-26 VITALS — BP 114/77 | HR 82 | Temp 97.6°F | Ht 65.0 in | Wt 209.2 lb

## 2011-09-26 DIAGNOSIS — J069 Acute upper respiratory infection, unspecified: Secondary | ICD-10-CM

## 2011-09-26 MED ORDER — GUAIFENESIN-CODEINE 100-10 MG/5ML PO SYRP: 5.0000 mL | ORAL_SOLUTION | Freq: Three times a day (TID) | ORAL | Status: AC | PRN

## 2011-09-26 MED ORDER — CETIRIZINE HCL 10 MG PO CAPS: 10.0000 mg | ORAL_CAPSULE | Freq: Every day | ORAL | Status: DC

## 2011-09-26 NOTE — Progress Notes (Signed)
  Subjective:   Patient ID: Karen Ferguson, female DOB: 14-Dec-1955 56 y.o. MRN: [id number] HPI:  1. Acute URI Onset: has been acute  Time period of: 3 day(s).  Severity is described as moderate.  Course of her symptoms over time is constant. Aggravating: cold. Alleviating: staying warm Associated sx/sn: cough and chills as well as loss of appetite. No fever, no sputum, no sore throat, no rash, no hematuria, no GU symptoms.  No emesis, no nausea, no diarrhea.   Tobacco use: Patient is a non-smoker.  Advised patient about the risks of smoking to health.  Labs reviewed: blood and protein on dipstick, micro: 3-6 rbc's.   Review of Systems Pertinent items are noted in HPI.    Objective:   Filed Vitals:   09/26/11 1358  BP: 114/77  Pulse: 82  Temp: 97.6 F (36.4 C)  TempSrc: Oral  Height: 5\' 5"  (1.651 m)  Weight: 209 lb 3.2 oz (94.892 kg)   Physical Exam: General: aaf, bundled up, appears unwell. Lungs:  Normal respiratory effort, chest expands symmetrically. Lungs are clear to auscultation, no crackles or wheezes. Heart - Regular rate and rhythm.  No murmurs, gallops or rubs.    Abdomen: soft and non-tender without masses, organomegaly or hernias noted.  No guarding or rebound Skin:  Intact without suspicious lesions or rashes  Assessment & Plan:

## 2011-09-26 NOTE — Assessment & Plan Note (Signed)
Will treat symptomatically. No red flags for bacterial illness.

## 2011-09-26 NOTE — Patient Instructions (Signed)
It was great to see you today!  Schedule an appointment to see Dr. Swaziland in one week.  Meds ordered this encounter  Medications  . guaiFENesin-codeine (ROBITUSSIN AC) 100-10 MG/5ML syrup    Sig: Take 5 mLs by mouth 3 (three) times daily as needed for cough.    Dispense:  120 mL    Refill:  0  . Cetirizine HCl 10 MG CAPS    Sig: Take 1 capsule (10 mg total) by mouth daily.    Dispense:  30 capsule    Refill:  3

## 2011-10-13 ENCOUNTER — Ambulatory Visit: Payer: Self-pay | Admitting: Family Medicine

## 2011-10-31 ENCOUNTER — Telehealth: Payer: Self-pay | Admitting: Family Medicine

## 2011-10-31 NOTE — Telephone Encounter (Signed)
I don't think she came in for the ESR.  Could you ask her to come in for that test, and then depending on the result, we can figure out which specialist to send her to?

## 2011-10-31 NOTE — Telephone Encounter (Signed)
Pt is asking to be referred to an orthopedic doctor for her arthritis.  I told her that she would need an appt with Dr Swaziland and she states she has already spoken with Dr Swaziland about this and doesn't feel that she needs to come in again.

## 2011-10-31 NOTE — Telephone Encounter (Signed)
Will forward to Dr. Swaziland.Karen Ferguson

## 2011-11-03 NOTE — Telephone Encounter (Signed)
Called pt and she will come in on Thursday 11/06/2011 @ 845. She also has been having some more issues with Carpal tunnel and is in a lot of pain.she stated that she has spoken to you about this .[name] [place]

## 2011-11-06 ENCOUNTER — Other Ambulatory Visit (INDEPENDENT_AMBULATORY_CARE_PROVIDER_SITE_OTHER): Payer: Self-pay

## 2011-11-06 DIAGNOSIS — M1990 Unspecified osteoarthritis, unspecified site: Secondary | ICD-10-CM

## 2011-11-06 DIAGNOSIS — M129 Arthropathy, unspecified: Secondary | ICD-10-CM

## 2011-11-06 NOTE — Progress Notes (Signed)
POCT ESR DONE TODAY Karen Ferguson

## 2011-11-10 ENCOUNTER — Telehealth: Payer: Self-pay | Admitting: *Deleted

## 2011-11-10 ENCOUNTER — Other Ambulatory Visit: Payer: Self-pay | Admitting: Family Medicine

## 2011-11-10 DIAGNOSIS — M25561 Pain in right knee: Secondary | ICD-10-CM

## 2011-11-10 DIAGNOSIS — M549 Dorsalgia, unspecified: Secondary | ICD-10-CM

## 2011-11-10 NOTE — Telephone Encounter (Signed)
Ortho referral ordered.

## 2011-11-10 NOTE — Telephone Encounter (Signed)
Called pt unavailable told person that answered to have her to call our office.[name] [place]

## 2011-11-11 NOTE — Telephone Encounter (Signed)
Called and informed pt that her results were abnormal and that Dr. Swaziland has put referral but that is not a guarantee that we could get her in prior to her [facility] [place] renewed but would try to get this done for her. Also told pt to be sure to go get recertified. Pt voiced understanding and agreed.[name] [place]

## 2011-11-11 NOTE — Telephone Encounter (Signed)
Is asking to speak with Karen Ferguson again.  Wants to understand a little more about her results

## 2011-11-11 NOTE — Telephone Encounter (Signed)
Pt called back to speak to [facility] - you can reach her at home.

## 2011-11-13 NOTE — Telephone Encounter (Signed)
Spoke with patient about her labs and informed her that they were showing arthritis. Patient has discussed this before but says that she needed to hear it once more. I am going to mail her a copy of labs per her request. Also discussed the ortho referral and due to her having the [facility] card there is a waiting list, unless she wanted to go to [facility]. There would be a payment up front and she would be responsible for lest if she does go there, she declined this at this time.

## 2011-11-13 NOTE — Telephone Encounter (Signed)
Patient would like a copy of the labs from 6/13 and she also wants to speak to someone about those labs because she was half asleep when they were discussed with her.

## 2011-11-17 ENCOUNTER — Ambulatory Visit: Payer: Self-pay | Admitting: Family Medicine

## 2011-11-23 ENCOUNTER — Emergency Department (HOSPITAL_COMMUNITY)
Admission: EM | Admit: 2011-11-23 | Discharge: 2011-11-24 | Disposition: A | Discharge status: Home / Self Care | Payer: Self-pay | Attending: Emergency Medicine | Admitting: Emergency Medicine

## 2011-11-23 ENCOUNTER — Emergency Department (HOSPITAL_COMMUNITY): Payer: Self-pay

## 2011-11-23 ENCOUNTER — Encounter (HOSPITAL_COMMUNITY): Payer: Self-pay | Admitting: Emergency Medicine

## 2011-11-23 DIAGNOSIS — F3289 Other specified depressive episodes: Secondary | ICD-10-CM | POA: Insufficient documentation

## 2011-11-23 DIAGNOSIS — J45901 Unspecified asthma with (acute) exacerbation: Secondary | ICD-10-CM | POA: Insufficient documentation

## 2011-11-23 DIAGNOSIS — F329 Major depressive disorder, single episode, unspecified: Secondary | ICD-10-CM | POA: Insufficient documentation

## 2011-11-23 DIAGNOSIS — Z8739 Personal history of other diseases of the musculoskeletal system and connective tissue: Secondary | ICD-10-CM | POA: Insufficient documentation

## 2011-11-23 DIAGNOSIS — IMO0002 Reserved for concepts with insufficient information to code with codable children: Secondary | ICD-10-CM | POA: Insufficient documentation

## 2011-11-23 DIAGNOSIS — Z79899 Other long term (current) drug therapy: Secondary | ICD-10-CM | POA: Insufficient documentation

## 2011-11-23 DIAGNOSIS — I10 Essential (primary) hypertension: Secondary | ICD-10-CM | POA: Insufficient documentation

## 2011-11-23 MED ORDER — ALBUTEROL SULFATE (5 MG/ML) 0.5% IN NEBU
5.0000 mg | INHALATION_SOLUTION | Freq: Once | RESPIRATORY_TRACT | Status: AC
  Administered 2011-11-23: 5 mg via RESPIRATORY_TRACT
  Filled 2011-11-23: qty 1

## 2011-11-23 MED ORDER — PREDNISONE 10 MG PO TABS: 50.0000 mg | ORAL_TABLET | Freq: Every day | ORAL | Status: DC

## 2011-11-23 MED ORDER — IPRATROPIUM BROMIDE 0.02 % IN SOLN
0.5000 mg | Freq: Once | RESPIRATORY_TRACT | Status: AC
  Administered 2011-11-23: 0.5 mg via RESPIRATORY_TRACT
  Filled 2011-11-23: qty 2.5

## 2011-11-23 MED ORDER — MOXIFLOXACIN HCL 400 MG PO TABS
400.0000 mg | ORAL_TABLET | Freq: Once | ORAL | Status: AC
  Administered 2011-11-23: 400 mg via ORAL
  Filled 2011-11-23: qty 1

## 2011-11-23 MED ORDER — MOXIFLOXACIN HCL 400 MG PO TABS: 400.0000 mg | ORAL_TABLET | Freq: Every day | ORAL | Status: DC

## 2011-11-23 MED ORDER — PREDNISONE 20 MG PO TABS
60.0000 mg | ORAL_TABLET | Freq: Once | ORAL | Status: AC
  Administered 2011-11-23: 60 mg via ORAL
  Filled 2011-11-23: qty 3

## 2011-11-23 NOTE — ED Provider Notes (Signed)
History     CSN: [id number]  Arrival date & time 11/23/11  2017   First MD Initiated Contact with Patient 11/23/11 2100      Chief Complaint  Patient presents with  . Asthma    (Consider location/radiation/quality/duration/timing/severity/associated sxs/prior treatment) HPI Pt presents with c/o wheezing, and cough.  Symptoms began yesterday. Pt has a hx of asthma and bronchitis.  No fever/chills. No chest pain.  Pt has tried using albuterol nebs at home with minimal relief.  No recent steroids.  No leg swelling.  Symptoms are worse at night and with exertion. There are no other associated systemic symptos, there are no other alleviating or modiyfing factors.   Past Medical History  Diagnosis Date  . Arthritis   . Depression   . Hypertension   . Asthma   . Bronchitis   . Degenerative disc disease     Past Surgical History  Procedure Date  . Cholecystectomy   . Carpal tunnel release   . Hemorrhoid surgery   . Abdominal hysterectomy 1988    for fibroid tumors    Family History  Problem Relation Age of Onset  . Arthritis Mother   . Hypertension Mother   . Diabetes Mother   . Kidney disease Mother   . Arthritis Sister   . Hypertension Brother   . Heart disease Maternal Aunt   . Heart disease Maternal Uncle   . Cancer Maternal Uncle   . Heart disease Maternal Grandfather     History  Substance Use Topics  . Smoking status: Never Smoker   . Smokeless tobacco: Never Used  . Alcohol Use: No    OB History    Grav Para Term Preterm Abortions TAB SAB Ect Mult Living                  Review of Systems ROS reviewed and all otherwise negative except for mentioned in HPI  Allergies  Review of patient's allergies indicates no known allergies.  Home Medications   Current Outpatient Rx  Name Route Sig Dispense Refill  . VITAMIN D 1000 UNITS PO TABS Oral Take 1,000 Units by mouth daily.    Marland Kitchen HYDROCHLOROTHIAZIDE 25 MG PO TABS Oral Take 1 tablet (25 mg total) by  mouth daily. 90 tablet 3  . ALBUTEROL SULFATE HFA 108 (90 BASE) MCG/ACT IN AERS Inhalation Inhale 2 puffs into the lungs every 4 (four) hours as needed for wheezing. 2 Inhaler 3  . BECLOMETHASONE DIPROPIONATE 80 MCG/ACT IN AERS Inhalation Inhale 1 puff into the lungs 2 (two) times daily. 1 Inhaler 3  . TRAMADOL HCL 50 MG PO TABS Oral Take 1 tablet (50 mg total) by mouth every 8 (eight) hours as needed for pain. 60 tablet 0    BP 140/72  Pulse 95  Temp 100.3 F (37.9 C) (Oral)  Resp 24  SpO2 95% Vitals reviewed Physical Exam Physical Examination: General appearance - alert, well appearing, and in no distress Mental status - alert, oriented to person, place, and time Eyes - pupils equal and reactive, extraocular eye movements intact Mouth - mucous membranes moist, pharynx normal without lesions Chest - BSS, decreased air movement bilaterally with wheezing, no dyspnea or increased respiratory effort Heart - normal rate, regular rhythm, normal S1, S2, no murmurs, rubs, clicks or gallops Abdomen - soft, nontender, nondistended, no masses or organomegaly Extremities - peripheral pulses normal, no pedal edema, no clubbing or cyanosis Skin - normal coloration and turgor, no rashes,  ED Course  Procedures (including critical care time)  11:18 PM pt feeling improved, she is still having wheezing however- she requests to be discharged.  She states she will see her MD in the AM for a recheck.  She agrees to use her nebulizer at home every 2-4 hours.    Labs Reviewed - No data to display Dg Chest 2 View  11/23/2011  *RADIOLOGY REPORT*  Clinical Data: Cough, abdominal pain, headache, shortness of breath.  CHEST - 2 VIEW  Comparison: 09/24/2011  Findings: Mild peribronchial thickening. Heart and mediastinal contours are within normal limits.  No focal opacities or effusions.  No acute bony abnormality.  IMPRESSION: Mild bronchitic changes.  Original Report Authenticated By: Cyndie Chime, M.D.      1. Asthma exacerbation       MDM  Pt with asthma/copd exacerbation- pt received steroids and albuterol/atrovent in ED x 2 with improvement in symptoms.  CXR reassuring.  Pt wishes to be discharged home.  I have offered that she stay for further treatment as she is still wheezing, but she prefers to continue nebs at home and see her doctor in the am.  Pt given strict return precautions and is agreeable with this plan.         Ethelda Chick, MD 11/24/11 [phone]

## 2011-11-23 NOTE — Discharge Instructions (Signed)
Return to the ED with any concerns including difficulty breathing despite using albuterol every 4 hours, not drinking fluids, decreased urine output, vomiting and not able to keep down liquids or medications, decreased level of alertness/lethargy, or any other alarming symptoms °

## 2011-11-23 NOTE — ED Notes (Signed)
Pt states understanding of discharge instructions 

## 2011-11-23 NOTE — ED Notes (Signed)
C/o sob, wheezing, and productive cough with yellow sputum since last night.  Using inhaler and nebs at home without relief.

## 2011-11-24 ENCOUNTER — Ambulatory Visit (INDEPENDENT_AMBULATORY_CARE_PROVIDER_SITE_OTHER): Payer: Self-pay | Admitting: Family Medicine

## 2011-11-24 ENCOUNTER — Encounter: Payer: Self-pay | Admitting: Family Medicine

## 2011-11-24 VITALS — BP 102/70 | HR 83 | Temp 99.0°F | Ht 65.0 in | Wt 202.5 lb

## 2011-11-24 DIAGNOSIS — M130 Polyarthritis, unspecified: Secondary | ICD-10-CM

## 2011-11-24 DIAGNOSIS — Z299 Encounter for prophylactic measures, unspecified: Secondary | ICD-10-CM

## 2011-11-24 DIAGNOSIS — M1990 Unspecified osteoarthritis, unspecified site: Secondary | ICD-10-CM

## 2011-11-24 DIAGNOSIS — I10 Essential (primary) hypertension: Secondary | ICD-10-CM

## 2011-11-24 DIAGNOSIS — M129 Arthropathy, unspecified: Secondary | ICD-10-CM

## 2011-11-24 DIAGNOSIS — J45901 Unspecified asthma with (acute) exacerbation: Secondary | ICD-10-CM

## 2011-11-24 DIAGNOSIS — M159 Polyosteoarthritis, unspecified: Secondary | ICD-10-CM

## 2011-11-24 MED ORDER — BECLOMETHASONE DIPROPIONATE 80 MCG/ACT IN AERS: 1.0000 | INHALATION_SPRAY | Freq: Two times a day (BID) | RESPIRATORY_TRACT | Status: DC

## 2011-11-24 MED ORDER — TRAMADOL HCL 50 MG PO TABS: 50.0000 mg | ORAL_TABLET | Freq: Three times a day (TID) | ORAL | Status: AC | PRN

## 2011-11-24 MED ORDER — ALBUTEROL SULFATE HFA 108 (90 BASE) MCG/ACT IN AERS: 2.0000 | INHALATION_SPRAY | RESPIRATORY_TRACT | Status: DC | PRN

## 2011-11-24 NOTE — Patient Instructions (Addendum)
It was great to see you today. We will try to add a steroid inhaler in to your medicines.  Let us know if you need a different one ordered. We will keep working on getting you in with an arthritis doctor.   You can try the tramadol for pain. We will get the records from your hysterectomy to see if you still need pap smears. Please schedule a mammogram when it is convenient for you. I think your stomach will feel better when your cough is better.

## 2011-11-25 ENCOUNTER — Encounter: Payer: Self-pay | Admitting: Family Medicine

## 2011-11-25 DIAGNOSIS — Z299 Encounter for prophylactic measures, unspecified: Secondary | ICD-10-CM | POA: Insufficient documentation

## 2011-11-25 DIAGNOSIS — M130 Polyarthritis, unspecified: Secondary | ICD-10-CM | POA: Insufficient documentation

## 2011-11-25 NOTE — Assessment & Plan Note (Signed)
Still with wheezes on exam.  Pt has prednisone rx from ER.  Will add steroid inhaler and refill albuterol.  Nonsmoker.

## 2011-11-25 NOTE — Assessment & Plan Note (Signed)
At goal today. Continue current regimen.  

## 2011-11-25 NOTE — Progress Notes (Signed)
  Subjective:    Patient ID: Karen Ferguson, female    DOB: [birth year], 56 y.o.   MRN: [id number]  HPI 56 you with polyarticular pain presents for follow up. She very much wants an arthritis doctor.  Her sed rate was mildly elevated, she has bilateral polyarticular migrating  pain (hands, knees ankles, hips).  Present for many years.  Had knee injections without relief.  Takes Tylenol without relief.  +family hx of "arthritis" - mother with rheumatoid and sister unknown type, but reports they both have hand deformity.  Denies locking in knees.  Does feel that they give way sometimes.  Also seen in ED last night for asthma exacerbation.  Has not gotten meds filled.  Feels better, but still with cough, abdominal pain in stomach muscles when she coughs.  Uses albuterol inhaler.    Reviewed recent xray reports with pt.     Review of Systems See HPI     Objective:   Physical Exam  Nursing note and vitals reviewed. Constitutional: She appears well-developed and well-nourished. No distress.  Cardiovascular: Normal rate, regular rhythm and normal heart sounds.  Exam reveals no gallop and no friction rub.   No murmur heard. Pulmonary/Chest: Effort normal. No respiratory distress. She has wheezes. She has no rales.       Prolonged exp phase.  Musculoskeletal:       B knees without swelling or warmth.  + crepitus B.  Stable ant/post/medially and laterally.   Hands TTP PIPs, especially R hand.  No ulnar deviation.    Skin: She is not diaphoretic.  Psychiatric: She has a normal mood and affect. Her behavior is normal. Judgment and thought content normal.          Assessment & Plan:

## 2011-11-25 NOTE — Assessment & Plan Note (Signed)
Labs, films reviewed.  Concerning for possible inflammatory arthritis, consider ankylosing spondylitis.  Refer to rheum for further workup and treatment if indicated.

## 2011-12-03 ENCOUNTER — Telehealth: Payer: Self-pay | Admitting: Family Medicine

## 2011-12-03 NOTE — Telephone Encounter (Signed)
Patient is calling for results of her blood work.  She will be going to work at 3:15 so if it is possible, she would like those results before that time.

## 2011-12-04 NOTE — Telephone Encounter (Signed)
Pt was Dr. Elvis Coil previously. I haven't evaluated her. I don't know exactly what tests results the pt wants. The rheumatoid factor is the last test I see on her chart and it is negative, this does not rule out completely the Rheumatoid Arthritis condition.  Dr. Swaziland recommended evaluation with rheumatology specialist.

## 2011-12-09 ENCOUNTER — Other Ambulatory Visit (HOSPITAL_COMMUNITY): Payer: Self-pay | Admitting: Sports Medicine

## 2011-12-09 DIAGNOSIS — M545 Low back pain, unspecified: Secondary | ICD-10-CM

## 2011-12-11 ENCOUNTER — Other Ambulatory Visit (HOSPITAL_COMMUNITY): Payer: Self-pay

## 2012-01-30 ENCOUNTER — Telehealth: Payer: Self-pay | Admitting: Family Medicine

## 2012-01-30 NOTE — Telephone Encounter (Signed)
Patient dropped off FMLA form to be filled out.  Please call her when completed.  Placed form in Dr. Elvis Coil box because patient has been reassigned and has never seen new dr.

## 2012-02-03 NOTE — Telephone Encounter (Signed)
Spoke with pt.  Forms completed.  Returned to [employer].

## 2012-02-03 NOTE — Telephone Encounter (Signed)
Dr. Swaziland informed patient that forms would be ready to day.  Copy made of forms to scan per Dr. Elvis Coil request.  Original forms placed at front desk for patient to pick up.  Ileana Ladd

## 2012-02-14 ENCOUNTER — Other Ambulatory Visit: Payer: Self-pay | Admitting: Family Medicine

## 2012-03-16 ENCOUNTER — Emergency Department (INDEPENDENT_AMBULATORY_CARE_PROVIDER_SITE_OTHER): Payer: PRIVATE HEALTH INSURANCE

## 2012-03-16 ENCOUNTER — Encounter (HOSPITAL_COMMUNITY): Payer: Self-pay | Admitting: Emergency Medicine

## 2012-03-16 ENCOUNTER — Emergency Department (INDEPENDENT_AMBULATORY_CARE_PROVIDER_SITE_OTHER)
Admission: EM | Admit: 2012-03-16 | Discharge: 2012-03-16 | Disposition: A | Discharge status: Home / Self Care | Payer: PRIVATE HEALTH INSURANCE | Source: Home / Self Care

## 2012-03-16 DIAGNOSIS — K863 Pseudocyst of pancreas: Secondary | ICD-10-CM | POA: Diagnosis present

## 2012-03-16 DIAGNOSIS — M8569 Other cyst of bone, multiple sites: Secondary | ICD-10-CM

## 2012-03-16 DIAGNOSIS — K862 Cyst of pancreas: Secondary | ICD-10-CM | POA: Diagnosis present

## 2012-03-16 DIAGNOSIS — IMO0002 Reserved for concepts with insufficient information to code with codable children: Secondary | ICD-10-CM

## 2012-03-16 DIAGNOSIS — S92301P Fracture of unspecified metatarsal bone(s), right foot, subsequent encounter for fracture with malunion: Secondary | ICD-10-CM

## 2012-03-16 MED ORDER — HYDROCODONE-ACETAMINOPHEN 5-325 MG PO TABS: 1.0000 | ORAL_TABLET | ORAL | Status: DC | PRN

## 2012-03-16 NOTE — ED Notes (Signed)
Right foot pain, reports burning, sharp pain in right foot, no known injury.  Issues for 5 months, no different today per patient.  Patient fell Saturday night, patient has been seen by employee health yesterday for her knee.  Her left hand started hurting last night and she relates to fall Saturday that knee has already been evaluated.  Reports she does not see "[employer] employee" physician again.

## 2012-03-16 NOTE — ED Notes (Signed)
Patient plans to see specialist next week

## 2012-03-16 NOTE — ED Provider Notes (Signed)
History     CSN: [id number]  Arrival date & time 03/16/12  1051   None     Chief Complaint  Patient presents with  . Foot Pain    (Consider location/radiation/quality/duration/timing/severity/associated sxs/prior treatment) HPI Comments: 56 year old female employee of [employer] is complaining of right foot pain for 5-6 months. She denies any known injury or event that may have caused the pain is located in the bottom of her foot plantar heel in the lateral aspect of the fifth metacarpal. However asking her specifically where it hurts she says it hurts all over and one area is just as bad as the other. There is mild puffiness over the lateral ankle. The pain is worse upon standing. In her job entails standing and walking majority of the time on her 8 hour shift. It is noteworthy that she does have flat feet.  The second complaint is that she fell 2-3 days ago while working. She doesn't know how she fell. SHe is complaining of pain in the base of the left thumb. There is a small protuberance at the base of the first metacarpal.   Patient is a 56 y.o. female presenting with lower extremity pain.  Foot Pain    Past Medical History  Diagnosis Date  . Arthritis   . Depression   . Hypertension   . Asthma   . Bronchitis   . Degenerative disc disease     Past Surgical History  Procedure Date  . Cholecystectomy   . Carpal tunnel release   . Hemorrhoid surgery   . Abdominal hysterectomy 1988    for fibroid tumors    Family History  Problem Relation Age of Onset  . Arthritis Mother   . Hypertension Mother   . Diabetes Mother   . Kidney disease Mother   . Arthritis Sister   . Hypertension Brother   . Heart disease Maternal Aunt   . Heart disease Maternal Uncle   . Cancer Maternal Uncle   . Heart disease Maternal Grandfather     History  Substance Use Topics  . Smoking status: Never Smoker   . Smokeless tobacco: Never Used  . Alcohol Use: No    OB History    Grav  Para Term Preterm Abortions TAB SAB Ect Mult Living                  Review of Systems  Constitutional: Negative for activity change.  HENT: Negative.   Respiratory: Negative.   Cardiovascular: Negative.   Gastrointestinal: Negative for nausea and vomiting.  Genitourinary: Negative for flank pain and pelvic pain.  Musculoskeletal:       As per HPI  Skin: Negative for color change, pallor and rash.  Neurological: Negative.     Allergies  Review of patient's allergies indicates no known allergies.  Home Medications   Current Outpatient Rx  Name Route Sig Dispense Refill  . ACETAMINOPHEN 325 MG PO TABS Oral Take 650 mg by mouth every 6 (six) hours as needed.    . IBUPROFEN 200 MG PO TABS Oral Take 200 mg by mouth every 6 (six) hours as needed.    . ALBUTEROL SULFATE HFA 108 (90 BASE) MCG/ACT IN AERS Inhalation Inhale 2 puffs into the lungs every 4 (four) hours as needed for wheezing. 2 Inhaler 3  . BECLOMETHASONE DIPROPIONATE 80 MCG/ACT IN AERS Inhalation Inhale 1 puff into the lungs 2 (two) times daily. 1 Inhaler 3  . VITAMIN D 1000 UNITS PO TABS Oral Take  1,000 Units by mouth daily.    Marland Kitchen HYDROCHLOROTHIAZIDE 25 MG PO TABS Oral Take 1 tablet (25 mg total) by mouth daily. 90 tablet 3  . HYDROCHLOROTHIAZIDE 25 MG PO TABS  TAKE ONE TABLET BY MOUTH EVERY DAY FOR BLOOD PRESSURE 90 tablet 4  . HYDROCODONE-ACETAMINOPHEN 5-325 MG PO TABS Oral Take 1 tablet by mouth every 4 (four) hours as needed for pain. 15 tablet 0    BP 131/78  Pulse 68  Temp 97.1 F (36.2 C) (Oral)  Resp 18  SpO2 98%  Physical Exam  Constitutional: She is oriented to person, place, and time. She appears well-developed and well-nourished. No distress.  HENT:  Head: Normocephalic and atraumatic.  Eyes: EOM are normal. Pupils are equal, round, and reactive to light.  Neck: Normal range of motion. Neck supple.  Musculoskeletal:       Right foot examination reveals callus formation along the lateral edge of the  heel and fifth metatarsal. There is tenderness in the plantar aspect of the heel. He when asked if there is tenderness in other parts of the foot she states that there isn't her whole foot hurts. However when distracted I can manipulate her foot and palpate other areas and there is no withdrawal or complaints of pain. Dorsiflexion and plantar flexion are intact. Ankle range of motion is intact distal neurovascular motor sensory is intact. Left hand exam reveals a small swelling protrusion at the base of the left first data carpal. Only minor tenderness. Negative Finkelstein's. Thumb opposition is intact. No discoloration or swelling  Lymphadenopathy:    She has no cervical adenopathy.  Neurological: She is alert and oriented to person, place, and time. No cranial nerve deficit.  Skin: Skin is warm and dry.  Psychiatric: She has a normal mood and affect.    ED Course  Procedures (including critical care time)  Labs Reviewed - No data to display Dg Hand Complete Left  03/16/2012  *RADIOLOGY REPORT*  Clinical Data: History of injury from fall.  Pain in the proximal left thumb area.  LEFT HAND - COMPLETE 3+ VIEW  Comparison: 06/11/2009.  Findings: Alignment is normal.  No fracture or dislocation is evident.  Chronic findings include narrowing of the joint space between the lunate and triquetrum with sclerosis.  Also cystic change involving the distal aspect of proximal phalanx of thumb at the IP joint.  Minimal spurring is seen at the DIP joints of second and fourth finger areas.  No chondrocalcinosis is seen.  IMPRESSION: No fracture or dislocation is evident.  Minimal degenerative spurring is seen.  The small cystic area in the distal aspect of the proximal phalanx of the thumb is stable.   Original Report Authenticated By: Crawford Givens, M.D.    Dg Foot Complete Right  03/16/2012  *RADIOLOGY REPORT*  Clinical Data: Pain laterally, no injury  RIGHT FOOT COMPLETE - 3+ VIEW  Comparison: None.  Findings:  There is slight cortical irregularity to the base of the right fifth metatarsal and a small avulsion fracture cannot be excluded.  Clinical correlation is recommended.  No other acute abnormality is seen.  There is some degenerative change at the right first MTP joint and within the PIP and DIP joints.  No erosion is noted.  Small calcaneal spurs are noted.  IMPRESSION: Cannot exclude small avulsion fracture from the base of the right fifth metatarsal.  Correlate clinically.   Original Report Authenticated By: Juline Patch, M.D.      1. Fracture of  metatarsal bone of right foot with malunion   2. Cyst and pseudocyst of pancreas   3. Cyst of bone (localized), unspecified       MDM  Given that she has an appointment with a specialist in [employer] for chronic pain in the right foot next monthshe said she couldn't stand it any longer and wanted it evaluated at the urgent care. Dg Hand Complete Left  03/16/2012  *RADIOLOGY REPORT*  Clinical Data: History of injury from fall.  Pain in the proximal left thumb area.  LEFT HAND - COMPLETE 3+ VIEW  Comparison: 06/11/2009.  Findings: Alignment is normal.  No fracture or dislocation is evident.  Chronic findings include narrowing of the joint space between the lunate and triquetrum with sclerosis.  Also cystic change involving the distal aspect of proximal phalanx of thumb at the IP joint.  Minimal spurring is seen at the DIP joints of second and fourth finger areas.  No chondrocalcinosis is seen.  IMPRESSION: No fracture or dislocation is evident.  Minimal degenerative spurring is seen.  The small cystic area in the distal aspect of the proximal phalanx of the thumb is stable.   Original Report Authenticated By: Crawford Givens, M.D.    Dg Foot Complete Right  03/16/2012  *RADIOLOGY REPORT*  Clinical Data: Pain laterally, no injury  RIGHT FOOT COMPLETE - 3+ VIEW  Comparison: None.  Findings: There is slight cortical irregularity to the base of the right  fifth metatarsal and a small avulsion fracture cannot be excluded.  Clinical correlation is recommended.  No other acute abnormality is seen.  There is some degenerative change at the right first MTP joint and within the PIP and DIP joints.  No erosion is noted.  Small calcaneal spurs are noted.  IMPRESSION: Cannot exclude small avulsion fracture from the base of the right fifth metatarsal.  Correlate clinically.   Original Report Authenticated By: Juline Patch, M.D.    Postop boot and use crutches with minimal weightbearing. Keep it elevated Norco 5 one every 4 hours when necessary pain #15  He may call your PCP in [place] for followup as needed when necessary or for worsening may return         Hayden Rasmussen, NP 03/16/12 1420

## 2012-03-17 NOTE — ED Provider Notes (Signed)
Medical screening examination/treatment/procedure(s) were performed by resident physician or non-physician practitioner and as supervising physician I was immediately available for consultation/collaboration.   Barkley Bruns MD.    Linna Hoff, MD 03/17/12 2104

## 2012-03-19 ENCOUNTER — Other Ambulatory Visit: Payer: Self-pay | Admitting: Family Medicine

## 2012-03-19 ENCOUNTER — Ambulatory Visit
Admission: RE | Admit: 2012-03-19 | Discharge: 2012-03-19 | Disposition: A | Discharge status: Home / Self Care | Payer: Worker's Compensation | Source: Ambulatory Visit | Attending: Family Medicine | Admitting: Family Medicine

## 2012-03-19 ENCOUNTER — Ambulatory Visit: Payer: Self-pay | Admitting: Family Medicine

## 2012-03-19 DIAGNOSIS — M25562 Pain in left knee: Secondary | ICD-10-CM

## 2012-03-19 DIAGNOSIS — W19XXXA Unspecified fall, initial encounter: Secondary | ICD-10-CM

## 2012-03-22 ENCOUNTER — Telehealth (HOSPITAL_COMMUNITY): Payer: Self-pay | Admitting: Emergency Medicine

## 2012-03-22 NOTE — ED Notes (Signed)
Patient called today concerned about diagnosis of foot fracture.  She states she followed up with employee health services today and was told there was "nothing wrong with her foot".  After reviewing her chart I explained to patient that radiology report stated a fracture would not be EXCLUDED.  I explained to patient what this meant.  She was then concerned with working restrictions.  She states physician at employee health gave her restrictions.  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  I explained to patient that was something that she would have to discuss with her manager.  Patient expressed understanding.  Patient did not have any further concerns.

## 2012-04-09 ENCOUNTER — Encounter: Payer: Self-pay | Admitting: Family Medicine

## 2012-04-09 ENCOUNTER — Ambulatory Visit (INDEPENDENT_AMBULATORY_CARE_PROVIDER_SITE_OTHER): Payer: Self-pay | Admitting: Family Medicine

## 2012-04-09 VITALS — BP 114/57 | HR 73 | Temp 98.1°F | Ht 65.0 in | Wt 198.0 lb

## 2012-04-09 DIAGNOSIS — J45901 Unspecified asthma with (acute) exacerbation: Secondary | ICD-10-CM

## 2012-04-09 MED ORDER — ALBUTEROL SULFATE HFA 108 (90 BASE) MCG/ACT IN AERS: 2.0000 | INHALATION_SPRAY | RESPIRATORY_TRACT | Status: DC | PRN

## 2012-04-09 MED ORDER — PREDNISONE 50 MG PO TABS: ORAL_TABLET | ORAL | Status: DC

## 2012-04-09 MED ORDER — BECLOMETHASONE DIPROPIONATE 80 MCG/ACT IN AERS: 1.0000 | INHALATION_SPRAY | Freq: Two times a day (BID) | RESPIRATORY_TRACT | Status: DC

## 2012-04-09 MED ORDER — PREDNISONE 10 MG PO TABS: 10.0000 mg | ORAL_TABLET | Freq: Every day | ORAL | Status: DC

## 2012-04-09 NOTE — Progress Notes (Signed)
Karen Ferguson is a 56 y.o. who presents today for asthma exacerbation.  Pt states this has been ongoing for about two days now, very SOB but no increased WOB, no dizziness, no fever, chills, or sweats along with non-productive cough.  Has been using her albuterol inhaler about every 30-60 minutes which does help.  However, she is not taking her Qvar and has not been taking this for several months due to the cost.    Has never been hospitalized for asthma exacerbation or has not required intubation. Also, no smoking and has not received her flu shot this year yet.   Past Medical History  Diagnosis Date  . Arthritis   . Depression   . Hypertension   . Asthma   . Bronchitis   . Degenerative disc disease     History   Social History  . Marital Status: Single   Social History Main Topics  . Smoking status: Never Smoker    Social History Narrative   Works at [employer] in [employer].  Lives with her 2 grandchildren.    Current Outpatient Prescriptions on File Prior to Visit  Medication Sig Dispense Refill  . cholecalciferol (VITAMIN D) 1000 UNITS tablet Take 1,000 Units by mouth daily.      . hydrochlorothiazide (HYDRODIURIL) 25 MG tablet Take 1 tablet (25 mg total) by mouth daily.  90 tablet  3  . hydrochlorothiazide (HYDRODIURIL) 25 MG tablet TAKE ONE TABLET BY MOUTH EVERY DAY FOR BLOOD PRESSURE  90 tablet  4  . HYDROcodone-acetaminophen (NORCO/VICODIN) 5-325 MG per tablet Take 1 tablet by mouth every 4 (four) hours as needed for pain.  15 tablet  0  . ibuprofen (ADVIL,MOTRIN) 200 MG tablet Take 200 mg by mouth every 6 (six) hours as needed.      . [DISCONTINUED] albuterol (PROVENTIL HFA;VENTOLIN HFA) 108 (90 BASE) MCG/ACT inhaler Inhale 2 puffs into the lungs every 4 (four) hours as needed for wheezing.  2 Inhaler  3  . acetaminophen (TYLENOL) 325 MG tablet Take 650 mg by mouth every 6 (six) hours as needed.         Physical Exam Filed Vitals:   04/09/12 1502  BP: 114/57    Pulse: 73  Temp: 98.1 F (36.7 C)   Pulse Ox : 100% on RA  Gen: NAD,  Resting comfortably  HEENT: PERLA, EOMI, Melissa/AT Neck: no JVD, no accessory muscle  Cardio: RRR, No murmurs appreciated  Lungs: + Diffuse scattered wheezes, worse Bibasilar, no intercostal retractions, no increased WOB Abd: NABS

## 2012-04-09 NOTE — Patient Instructions (Addendum)
Karen Ferguson, it was nice seeing you today.  I hope you start to feel better over the next couple of days.  I think this is most likely a virus causing your asthma related symptoms.  If you do not improve by early next week, please call back.  Thanks, Dr. Paulina Fusi   Asthma, Acute Bronchospasm Your exam shows you have asthma, or acute bronchospasm that acts like asthma. Bronchospasm means your air passages become narrowed. These conditions are due to inflammation and airway spasm that cause narrowing of the bronchial tubes in the lungs. This causes you to have wheezing and shortness of breath. CAUSES  Respiratory infections and allergies most often bring on these attacks. Smoking, air pollution, cold air, emotional upsets, and vigorous exercise can also bring them on.  TREATMENT   Treatment is aimed at making the narrowed airways larger. Mild asthma/bronchospasm is usually controlled with inhaled medicines. Albuterol is a common medicine that you breathe in to open spastic or narrowed airways. Some trade names for albuterol are Ventolin or Proventil. Steroid medicine is also used to reduce the inflammation when an attack is moderate or severe. Antibiotics (medications used to kill germs) are only used if a bacterial infection is present.  If you are pregnant and need to use Albuterol (Ventolin or Proventil), you can expect the baby to move more than usual shortly after the medicine is used. HOME CARE INSTRUCTIONS   Rest.  Drink plenty of liquids. This helps the mucus to remain thin and easily coughed up. Do not use caffeine or alcohol.  Do not smoke. Avoid being exposed to second-hand smoke.  You play a critical role in keeping yourself in good health. Avoid exposure to things that cause you to wheeze. Avoid exposure to things that cause you to have breathing problems. Keep your medications up-to-date and available. Carefully follow your doctor's treatment plan.  When pollen or pollution is bad, keep  windows closed and use an air conditioner go to places with air conditioning. If you are allergic to furry pets or birds, find new homes for them or keep them outside.  Take your medicine exactly as prescribed.  Asthma requires careful medical attention. See your caregiver for follow-up as advised. If you are more than [redacted] weeks pregnant and you were prescribed any new medications, let your Obstetrician know about the visit and how you are doing. Arrange a recheck. SEEK IMMEDIATE MEDICAL CARE IF:   You are getting worse.  You have trouble breathing. If severe, call 911.  You develop chest pain or discomfort.  You are throwing up or not drinking fluids.  You are not getting better within 24 hours.  You are coughing up yellow, green, brown, or bloody sputum.  You develop a fever over 102 F (38.9 C).  You have trouble swallowing. MAKE SURE YOU:   Understand these instructions.  Will watch your condition.  Will get help right away if you are not doing well or get worse. Document Released: 08/27/2006 Document Revised: 08/04/2011 Document Reviewed: 04/26/2007 [facility] Patient Information 2013 [place], [place].

## 2012-04-09 NOTE — Assessment & Plan Note (Signed)
Ongoing now for two days, using albuterol inhaler q 30 minutes to 1 hr with wheezing and SOB.  Has not been able to use the Qvar because of the expense.  Will give prednisone burst with taper and refill albuterol.  Information given on MAP program for possible controller med to prevent these attacks.  Do not feel this is infxn as no fever, no productive cough, no chills, so will not tx as.  If no improvement by early next week, recommended follow up appointment with possible ABx, otherwise PRN visit.

## 2012-04-27 ENCOUNTER — Telehealth: Payer: Self-pay | Admitting: Family Medicine

## 2012-04-27 NOTE — Telephone Encounter (Signed)
Attempted to call patient, but number has been temporarily disconnected. Patient's referral is good until 05/23/2012. Patient is to call to have the appointment rescheduled [phone] dr. Lanell Matar

## 2012-04-27 NOTE — Telephone Encounter (Signed)
Informed patient that she can call and reschedule this appointment

## 2012-04-27 NOTE — Telephone Encounter (Signed)
Patient is calling because she missed the appt with the Rheumatologist through [facility] and she needs a new referral and appt.

## 2012-06-11 ENCOUNTER — Ambulatory Visit: Payer: PRIVATE HEALTH INSURANCE | Attending: Sports Medicine

## 2012-06-11 DIAGNOSIS — R262 Difficulty in walking, not elsewhere classified: Secondary | ICD-10-CM | POA: Insufficient documentation

## 2012-06-11 DIAGNOSIS — M2550 Pain in unspecified joint: Secondary | ICD-10-CM | POA: Insufficient documentation

## 2012-06-11 DIAGNOSIS — M25669 Stiffness of unspecified knee, not elsewhere classified: Secondary | ICD-10-CM | POA: Insufficient documentation

## 2012-06-11 DIAGNOSIS — IMO0001 Reserved for inherently not codable concepts without codable children: Secondary | ICD-10-CM | POA: Insufficient documentation

## 2012-06-11 DIAGNOSIS — M6281 Muscle weakness (generalized): Secondary | ICD-10-CM | POA: Insufficient documentation

## 2012-06-16 ENCOUNTER — Ambulatory Visit: Payer: PRIVATE HEALTH INSURANCE | Attending: Sports Medicine | Admitting: Physical Therapy

## 2012-06-16 DIAGNOSIS — R262 Difficulty in walking, not elsewhere classified: Secondary | ICD-10-CM | POA: Insufficient documentation

## 2012-06-16 DIAGNOSIS — M6281 Muscle weakness (generalized): Secondary | ICD-10-CM | POA: Insufficient documentation

## 2012-06-16 DIAGNOSIS — IMO0001 Reserved for inherently not codable concepts without codable children: Secondary | ICD-10-CM | POA: Insufficient documentation

## 2012-06-16 DIAGNOSIS — M2550 Pain in unspecified joint: Secondary | ICD-10-CM | POA: Insufficient documentation

## 2012-06-16 DIAGNOSIS — M25669 Stiffness of unspecified knee, not elsewhere classified: Secondary | ICD-10-CM | POA: Insufficient documentation

## 2012-06-21 ENCOUNTER — Ambulatory Visit: Payer: PRIVATE HEALTH INSURANCE | Attending: Family Medicine | Admitting: Physical Therapy

## 2012-06-21 DIAGNOSIS — M2550 Pain in unspecified joint: Secondary | ICD-10-CM | POA: Insufficient documentation

## 2012-06-21 DIAGNOSIS — R262 Difficulty in walking, not elsewhere classified: Secondary | ICD-10-CM | POA: Insufficient documentation

## 2012-06-21 DIAGNOSIS — IMO0001 Reserved for inherently not codable concepts without codable children: Secondary | ICD-10-CM | POA: Insufficient documentation

## 2012-06-21 DIAGNOSIS — M25669 Stiffness of unspecified knee, not elsewhere classified: Secondary | ICD-10-CM | POA: Insufficient documentation

## 2012-06-21 DIAGNOSIS — M6281 Muscle weakness (generalized): Secondary | ICD-10-CM | POA: Insufficient documentation

## 2012-06-24 ENCOUNTER — Encounter: Payer: Self-pay | Admitting: Rehabilitation

## 2012-06-24 ENCOUNTER — Ambulatory Visit: Payer: PRIVATE HEALTH INSURANCE | Attending: Family Medicine | Admitting: Rehabilitation

## 2012-06-24 DIAGNOSIS — R262 Difficulty in walking, not elsewhere classified: Secondary | ICD-10-CM | POA: Insufficient documentation

## 2012-06-24 DIAGNOSIS — IMO0001 Reserved for inherently not codable concepts without codable children: Secondary | ICD-10-CM | POA: Insufficient documentation

## 2012-06-24 DIAGNOSIS — M6281 Muscle weakness (generalized): Secondary | ICD-10-CM | POA: Insufficient documentation

## 2012-06-24 DIAGNOSIS — M2550 Pain in unspecified joint: Secondary | ICD-10-CM | POA: Insufficient documentation

## 2012-06-24 DIAGNOSIS — M25669 Stiffness of unspecified knee, not elsewhere classified: Secondary | ICD-10-CM | POA: Insufficient documentation

## 2012-06-28 ENCOUNTER — Ambulatory Visit: Payer: PRIVATE HEALTH INSURANCE | Attending: Family Medicine

## 2012-06-28 DIAGNOSIS — R262 Difficulty in walking, not elsewhere classified: Secondary | ICD-10-CM | POA: Insufficient documentation

## 2012-06-28 DIAGNOSIS — M6281 Muscle weakness (generalized): Secondary | ICD-10-CM | POA: Insufficient documentation

## 2012-06-28 DIAGNOSIS — M2550 Pain in unspecified joint: Secondary | ICD-10-CM | POA: Insufficient documentation

## 2012-06-28 DIAGNOSIS — IMO0001 Reserved for inherently not codable concepts without codable children: Secondary | ICD-10-CM | POA: Insufficient documentation

## 2012-06-28 DIAGNOSIS — M25669 Stiffness of unspecified knee, not elsewhere classified: Secondary | ICD-10-CM | POA: Insufficient documentation

## 2012-07-01 ENCOUNTER — Ambulatory Visit: Payer: PRIVATE HEALTH INSURANCE | Attending: Family Medicine

## 2012-07-01 DIAGNOSIS — IMO0001 Reserved for inherently not codable concepts without codable children: Secondary | ICD-10-CM | POA: Insufficient documentation

## 2012-07-01 DIAGNOSIS — M25669 Stiffness of unspecified knee, not elsewhere classified: Secondary | ICD-10-CM | POA: Insufficient documentation

## 2012-07-01 DIAGNOSIS — R262 Difficulty in walking, not elsewhere classified: Secondary | ICD-10-CM | POA: Insufficient documentation

## 2012-07-01 DIAGNOSIS — M6281 Muscle weakness (generalized): Secondary | ICD-10-CM | POA: Insufficient documentation

## 2012-07-01 DIAGNOSIS — M2550 Pain in unspecified joint: Secondary | ICD-10-CM | POA: Insufficient documentation

## 2012-07-05 ENCOUNTER — Ambulatory Visit: Payer: PRIVATE HEALTH INSURANCE

## 2013-01-10 DIAGNOSIS — M79673 Pain in unspecified foot: Secondary | ICD-10-CM | POA: Insufficient documentation

## 2013-01-10 DIAGNOSIS — S96919A Strain of unspecified muscle and tendon at ankle and foot level, unspecified foot, initial encounter: Secondary | ICD-10-CM | POA: Insufficient documentation

## 2013-01-31 ENCOUNTER — Telehealth: Payer: Self-pay | Admitting: Family Medicine

## 2013-01-31 NOTE — Telephone Encounter (Signed)
FMLA forms completed by Dr [place] expire on Wed Sept 10. Wants to know what do to get them extended. Please advise

## 2013-02-25 ENCOUNTER — Encounter (HOSPITAL_BASED_OUTPATIENT_CLINIC_OR_DEPARTMENT_OTHER): Payer: Self-pay | Admitting: *Deleted

## 2013-02-25 NOTE — Progress Notes (Signed)
Pt goes to [facility]-denies sleep apnea,cardiac problems To come in for [id number]

## 2013-03-01 ENCOUNTER — Encounter (HOSPITAL_BASED_OUTPATIENT_CLINIC_OR_DEPARTMENT_OTHER)
Admission: RE | Admit: 2013-03-01 | Discharge: 2013-03-01 | Disposition: A | Discharge status: Home / Self Care | Payer: Self-pay | Source: Ambulatory Visit | Attending: Orthopedic Surgery | Admitting: Orthopedic Surgery

## 2013-03-01 ENCOUNTER — Other Ambulatory Visit: Payer: Self-pay

## 2013-03-01 DIAGNOSIS — Z01810 Encounter for preprocedural cardiovascular examination: Secondary | ICD-10-CM | POA: Insufficient documentation

## 2013-03-01 LAB — BASIC METABOLIC PANEL
GFR calc Af Amer: 85 mL/min — ABNORMAL LOW (ref >90)
GFR calc non Af Amer: 74 mL/min — ABNORMAL LOW (ref >90)
Glucose, Bld: 75 mg/dL (ref 70–99)
Potassium: 3.8 mEq/L (ref 3.5–5.1)
Sodium: 141 mEq/L (ref 135–145)

## 2013-03-02 ENCOUNTER — Other Ambulatory Visit: Payer: Self-pay | Admitting: Orthopedic Surgery

## 2013-03-02 ENCOUNTER — Telehealth: Payer: Self-pay | Admitting: Family Medicine

## 2013-03-02 NOTE — Telephone Encounter (Signed)
Refill request for BP meds.  

## 2013-03-03 ENCOUNTER — Encounter (HOSPITAL_BASED_OUTPATIENT_CLINIC_OR_DEPARTMENT_OTHER)
Admission: RE | Disposition: A | Discharge status: Home / Self Care | Payer: Self-pay | Source: Ambulatory Visit | Attending: Orthopedic Surgery

## 2013-03-03 ENCOUNTER — Encounter (HOSPITAL_BASED_OUTPATIENT_CLINIC_OR_DEPARTMENT_OTHER): Payer: PRIVATE HEALTH INSURANCE | Admitting: Anesthesiology

## 2013-03-03 ENCOUNTER — Ambulatory Visit (HOSPITAL_BASED_OUTPATIENT_CLINIC_OR_DEPARTMENT_OTHER): Payer: PRIVATE HEALTH INSURANCE | Admitting: Anesthesiology

## 2013-03-03 ENCOUNTER — Encounter (HOSPITAL_BASED_OUTPATIENT_CLINIC_OR_DEPARTMENT_OTHER): Payer: Self-pay

## 2013-03-03 ENCOUNTER — Ambulatory Visit (HOSPITAL_BASED_OUTPATIENT_CLINIC_OR_DEPARTMENT_OTHER)
Admission: RE | Admit: 2013-03-03 | Discharge: 2013-03-03 | Disposition: A | Discharge status: Home / Self Care | Payer: PRIVATE HEALTH INSURANCE | Source: Ambulatory Visit | Attending: Orthopedic Surgery | Admitting: Orthopedic Surgery

## 2013-03-03 DIAGNOSIS — I10 Essential (primary) hypertension: Secondary | ICD-10-CM | POA: Insufficient documentation

## 2013-03-03 DIAGNOSIS — S86301A Unspecified injury of muscle(s) and tendon(s) of peroneal muscle group at lower leg level, right leg, initial encounter: Secondary | ICD-10-CM

## 2013-03-03 DIAGNOSIS — M65979 Unspecified synovitis and tenosynovitis, unspecified ankle and foot: Secondary | ICD-10-CM | POA: Insufficient documentation

## 2013-03-03 DIAGNOSIS — M24176 Other articular cartilage disorders, unspecified foot: Secondary | ICD-10-CM | POA: Insufficient documentation

## 2013-03-03 DIAGNOSIS — M659 Synovitis and tenosynovitis, unspecified: Secondary | ICD-10-CM | POA: Insufficient documentation

## 2013-03-03 DIAGNOSIS — M249 Joint derangement, unspecified: Secondary | ICD-10-CM | POA: Insufficient documentation

## 2013-03-03 HISTORY — PX: TENDON REPAIR: SHX5111

## 2013-03-03 SURGERY — TENDON REPAIR
Anesthesia: Regional | Site: Foot | Laterality: Right | Wound class: Clean

## 2013-03-03 MED ORDER — BACITRACIN ZINC 500 UNIT/GM EX OINT
TOPICAL_OINTMENT | CUTANEOUS | Status: DC | PRN
  Administered 2013-03-03: 1 via TOPICAL

## 2013-03-03 MED ORDER — ONDANSETRON HCL 4 MG/2ML IJ SOLN
INTRAMUSCULAR | Status: DC | PRN
  Administered 2013-03-03: 4 mg via INTRAMUSCULAR

## 2013-03-03 MED ORDER — HYDROMORPHONE HCL PF 1 MG/ML IJ SOLN: 0.2500 mg | INTRAMUSCULAR | Status: DC | PRN

## 2013-03-03 MED ORDER — LIDOCAINE HCL (CARDIAC) 20 MG/ML IV SOLN
INTRAVENOUS | Status: DC | PRN
  Administered 2013-03-03: 30 mg via INTRAVENOUS

## 2013-03-03 MED ORDER — DEXAMETHASONE SODIUM PHOSPHATE 10 MG/ML IJ SOLN
INTRAMUSCULAR | Status: DC | PRN
  Administered 2013-03-03: 10 mg via INTRAVENOUS

## 2013-03-03 MED ORDER — BUPIVACAINE HCL (PF) 0.5 % IJ SOLN
INTRAMUSCULAR | Status: DC | PRN
  Administered 2013-03-03: 10 mL

## 2013-03-03 MED ORDER — OXYCODONE HCL 5 MG PO TABS: 5.0000 mg | ORAL_TABLET | Freq: Once | ORAL | Status: DC | PRN

## 2013-03-03 MED ORDER — CEFAZOLIN SODIUM-DEXTROSE 2-3 GM-% IV SOLR
2.0000 g | INTRAVENOUS | Status: AC
  Administered 2013-03-03: 2 g via INTRAVENOUS

## 2013-03-03 MED ORDER — FENTANYL CITRATE 0.05 MG/ML IJ SOLN
50.0000 ug | INTRAMUSCULAR | Status: DC | PRN
  Administered 2013-03-03: 100 ug via INTRAVENOUS

## 2013-03-03 MED ORDER — PROPOFOL 10 MG/ML IV BOLUS
INTRAVENOUS | Status: DC | PRN
  Administered 2013-03-03: 150 mg via INTRAVENOUS

## 2013-03-03 MED ORDER — SODIUM CHLORIDE 0.9 % IV SOLN: INTRAVENOUS | Status: DC

## 2013-03-03 MED ORDER — CHLORHEXIDINE GLUCONATE 4 % EX LIQD: 60.0000 mL | Freq: Once | CUTANEOUS | Status: DC

## 2013-03-03 MED ORDER — LACTATED RINGERS IV SOLN
INTRAVENOUS | Status: DC
  Administered 2013-03-03: 08:00:00 via INTRAVENOUS

## 2013-03-03 MED ORDER — BUPIVACAINE-EPINEPHRINE PF 0.5-1:200000 % IJ SOLN
INTRAMUSCULAR | Status: DC | PRN
  Administered 2013-03-03: 30 mL

## 2013-03-03 MED ORDER — OXYCODONE HCL 5 MG PO TABS: 5.0000 mg | ORAL_TABLET | ORAL | Status: DC | PRN

## 2013-03-03 MED ORDER — MIDAZOLAM HCL 2 MG/2ML IJ SOLN
1.0000 mg | INTRAMUSCULAR | Status: DC | PRN
  Administered 2013-03-03: 1 mg via INTRAVENOUS

## 2013-03-03 MED ORDER — OXYCODONE HCL 5 MG/5ML PO SOLN: 5.0000 mg | Freq: Once | ORAL | Status: DC | PRN

## 2013-03-03 SURGICAL SUPPLY — 80 items
BANDAGE ESMARK 6X9 LF (GAUZE/BANDAGES/DRESSINGS) ×1 IMPLANT
BLADE AVERAGE 25X9 (BLADE) IMPLANT
BLADE SURG 15 STRL LF DISP TIS (BLADE) ×2 IMPLANT
BLADE SURG 15 STRL SS (BLADE) ×4
BNDG CMPR 9X4 STRL LF SNTH (GAUZE/BANDAGES/DRESSINGS)
BNDG CMPR 9X6 STRL LF SNTH (GAUZE/BANDAGES/DRESSINGS) ×1
BNDG COHESIVE 4X5 TAN STRL (GAUZE/BANDAGES/DRESSINGS) ×1 IMPLANT
BNDG COHESIVE 6X5 TAN STRL LF (GAUZE/BANDAGES/DRESSINGS) ×1 IMPLANT
BNDG ESMARK 4X9 LF (GAUZE/BANDAGES/DRESSINGS) IMPLANT
BNDG ESMARK 6X9 LF (GAUZE/BANDAGES/DRESSINGS) ×2
CHLORAPREP W/TINT 26ML (MISCELLANEOUS) ×2 IMPLANT
COVER TABLE BACK 60X90 (DRAPES) ×2 IMPLANT
CUFF TOURNIQUET SINGLE 34IN LL (TOURNIQUET CUFF) ×2 IMPLANT
DECANTER SPIKE VIAL GLASS SM (MISCELLANEOUS) IMPLANT
DRAPE C-ARM 42X72 X-RAY (DRAPES) IMPLANT
DRAPE C-ARMOR (DRAPES) IMPLANT
DRAPE EXTREMITY T 121X128X90 (DRAPE) ×2 IMPLANT
DRAPE OEC MINIVIEW 54X84 (DRAPES) IMPLANT
DRAPE U-SHAPE 47X51 STRL (DRAPES) ×1 IMPLANT
DRSG EMULSION OIL 3X3 NADH (GAUZE/BANDAGES/DRESSINGS) ×2 IMPLANT
DRSG PAD ABDOMINAL 8X10 ST (GAUZE/BANDAGES/DRESSINGS) ×4 IMPLANT
ELECT REM PT RETURN 9FT ADLT (ELECTROSURGICAL) ×2
ELECTRODE REM PT RTRN 9FT ADLT (ELECTROSURGICAL) ×1 IMPLANT
GLOVE BIO SURGEON STRL SZ8 (GLOVE) ×2 IMPLANT
GLOVE BIOGEL PI IND STRL 7.0 (GLOVE) IMPLANT
GLOVE BIOGEL PI IND STRL 7.5 (GLOVE) ×1 IMPLANT
GLOVE BIOGEL PI IND STRL 8 (GLOVE) ×1 IMPLANT
GLOVE BIOGEL PI INDICATOR 7.0 (GLOVE) ×1
GLOVE BIOGEL PI INDICATOR 7.5 (GLOVE) ×1
GLOVE BIOGEL PI INDICATOR 8 (GLOVE) ×1
GLOVE ECLIPSE 6.5 STRL STRAW (GLOVE) ×1 IMPLANT
GLOVE ECLIPSE 7.0 STRL STRAW (GLOVE) ×2 IMPLANT
GLOVE EXAM NITRILE MD LF STRL (GLOVE) ×1 IMPLANT
GOWN PREVENTION PLUS XLARGE (GOWN DISPOSABLE) ×4 IMPLANT
GOWN PREVENTION PLUS XXLARGE (GOWN DISPOSABLE) ×2 IMPLANT
K-WIRE .062X4 (WIRE) IMPLANT
KIT BIO-TENODESIS 3X8 DISP (MISCELLANEOUS)
KIT INSRT BABSR STRL DISP BTN (MISCELLANEOUS) IMPLANT
NDL SUT 6 .5 CRC .975X.05 MAYO (NEEDLE) IMPLANT
NEEDLE HYPO 22GX1.5 SAFETY (NEEDLE) IMPLANT
NEEDLE MAYO TAPER (NEEDLE)
NS IRRIG 1000ML POUR BTL (IV SOLUTION) ×2 IMPLANT
PACK BASIN DAY SURGERY FS (CUSTOM PROCEDURE TRAY) ×2 IMPLANT
PAD CAST 4YDX4 CTTN HI CHSV (CAST SUPPLIES) ×1 IMPLANT
PADDING CAST ABS 4INX4YD NS (CAST SUPPLIES)
PADDING CAST ABS COTTON 4X4 ST (CAST SUPPLIES) IMPLANT
PADDING CAST COTTON 4X4 STRL (CAST SUPPLIES) ×2
PADDING CAST COTTON 6X4 STRL (CAST SUPPLIES) ×2 IMPLANT
PASSER SUT SWANSON 36MM LOOP (INSTRUMENTS) IMPLANT
PENCIL BUTTON HOLSTER BLD 10FT (ELECTRODE) ×2 IMPLANT
RETRIEVER SUT HEWSON (MISCELLANEOUS) IMPLANT
SANITIZER HAND PURELL 535ML FO (MISCELLANEOUS) ×2 IMPLANT
SCOTCHCAST PLUS 4X4 WHITE (CAST SUPPLIES) IMPLANT
SHEET MEDIUM DRAPE 40X70 STRL (DRAPES) ×2 IMPLANT
SLEEVE SCD COMPRESS KNEE MED (MISCELLANEOUS) ×2 IMPLANT
SPLINT FAST PLASTER 5X30 (CAST SUPPLIES) ×20
SPLINT PLASTER CAST FAST 5X30 (CAST SUPPLIES) IMPLANT
SPONGE GAUZE 4X4 12PLY (GAUZE/BANDAGES/DRESSINGS) ×2 IMPLANT
SPONGE LAP 18X18 X RAY DECT (DISPOSABLE) ×2 IMPLANT
STOCKINETTE 6  STRL (DRAPES) ×1
STOCKINETTE 6 STRL (DRAPES) ×1 IMPLANT
SUCTION FRAZIER TIP 10 FR DISP (SUCTIONS) IMPLANT
SUT ETHIBOND 0 MO6 C/R (SUTURE) IMPLANT
SUT ETHIBOND 2 OS 4 DA (SUTURE) IMPLANT
SUT ETHILON 3 0 PS 1 (SUTURE) ×2 IMPLANT
SUT FIBERWIRE 2-0 18 17.9 3/8 (SUTURE)
SUT MERSILENE 2.0 SH NDLE (SUTURE) IMPLANT
SUT MNCRL AB 3-0 PS2 18 (SUTURE) ×2 IMPLANT
SUT MNCRL AB 4-0 PS2 18 (SUTURE) IMPLANT
SUT VIC AB 0 SH 27 (SUTURE) ×1 IMPLANT
SUT VIC AB 2-0 SH 18 (SUTURE) IMPLANT
SUT VIC AB 2-0 SH 27 (SUTURE)
SUT VIC AB 2-0 SH 27XBRD (SUTURE) IMPLANT
SUT VICRYL 4-0 PS2 18IN ABS (SUTURE) IMPLANT
SUTURE FIBERWR 2-0 18 17.9 3/8 (SUTURE) IMPLANT
SYR BULB 3OZ (MISCELLANEOUS) ×2 IMPLANT
TOWEL OR 17X24 6PK STRL BLUE (TOWEL DISPOSABLE) ×2 IMPLANT
TOWEL OR NON WOVEN STRL DISP B (DISPOSABLE) ×2 IMPLANT
TUBE CONNECTING 20X1/4 (TUBING) ×2 IMPLANT
UNDERPAD 30X30 INCONTINENT (UNDERPADS AND DIAPERS) ×2 IMPLANT

## 2013-03-03 NOTE — Anesthesia Procedure Notes (Addendum)
Anesthesia Regional Block:  Popliteal block  Pre-Anesthetic Checklist: ,, timeout performed, Correct Patient, Correct Site, Correct Laterality, Correct Procedure, Correct Position, site marked, Risks and benefits discussed, pre-op evaluation, post-op pain management  Laterality: Right  Prep: Maximum Sterile Barrier Precautions used and chloraprep       Needles:  Injection technique: Single-shot  Needle Type: Echogenic Stimulator Needle      Needle Gauge: 21 and 21 G    Additional Needles:  Procedures: ultrasound guided (picture in chart) and nerve stimulator Popliteal block  Nerve Stimulator or Paresthesia:  Response: Peroneal,  Response: Tibial,   Additional Responses:   Narrative:  Start time: 03/03/2013 8:10 AM End time: 03/03/2013 8:18 AM Injection made incrementally with aspirations every 5 mL. Anesthesiologist: Sampson Goon, MD  Additional Notes: 2% Lidocaine skin wheel. Saphenous block with 10cc of 0.5% Bupivicaine plain.  Popliteal block Procedure Name: LMA Insertion Date/Time: 03/03/2013 9:02 AM Performed by: Toni Arthurs Pre-anesthesia Checklist: Patient identified, Emergency Drugs available, Suction available and Patient being monitored Patient Re-evaluated:Patient Re-evaluated prior to inductionOxygen Delivery Method: Circle System Utilized Preoxygenation: Pre-oxygenation with 100% oxygen Intubation Type: IV induction Ventilation: Mask ventilation without difficulty LMA: LMA inserted LMA Size: 4.0 Number of attempts: 1 Airway Equipment and Method: bite block Placement Confirmation: positive ETCO2 Tube secured with: Tape Dental Injury: Teeth and Oropharynx as per pre-operative assessment

## 2013-03-03 NOTE — Anesthesia Preprocedure Evaluation (Addendum)
Anesthesia Evaluation  Patient identified by MRN, date of birth, ID band Patient awake    Reviewed: Allergy & Precautions, H&P , NPO status , Patient's Chart, lab work & pertinent test results  Airway Mallampati: III TM Distance: >3 FB Neck ROM: Full    Dental no notable dental hx. (+) Teeth Intact and Dental Advisory Given   Pulmonary asthma ,  breath sounds clear to auscultation  Pulmonary exam normal       Cardiovascular hypertension, On Medications Rhythm:Regular Rate:Normal     Neuro/Psych PSYCHIATRIC DISORDERS negative neurological ROS     GI/Hepatic negative GI ROS, Neg liver ROS,   Endo/Other  negative endocrine ROS  Renal/GU negative Renal ROS  negative genitourinary   Musculoskeletal   Abdominal   Peds  Hematology negative hematology ROS (+)   Anesthesia Other Findings   Reproductive/Obstetrics negative OB ROS                          Anesthesia Physical Anesthesia Plan  ASA: II  Anesthesia Plan: General and Regional   Post-op Pain Management:    Induction: Intravenous  Airway Management Planned: LMA  Additional Equipment:   Intra-op Plan:   Post-operative Plan: Extubation in OR  Informed Consent: I have reviewed the patients History and Physical, chart, labs and discussed the procedure including the risks, benefits and alternatives for the proposed anesthesia with the patient or authorized representative who has indicated his/her understanding and acceptance.   Dental advisory given  Plan Discussed with: CRNA and Surgeon  Anesthesia Plan Comments:         Anesthesia Quick Evaluation

## 2013-03-03 NOTE — Transfer of Care (Signed)
Immediate Anesthesia Transfer of Care Note  Patient: Karen Ferguson [facility]  Procedure(s) Performed: Procedure(s): RIGHT DEBRIDEMENT AND TENOLYSIS OF PERONEOUS LONGOUS AND BREVIS TENDONS  (Right)  Patient Location: PACU  Anesthesia Type:GA combined with regional for post-op pain  Level of Consciousness: awake and patient cooperative  Airway & Oxygen Therapy: Patient Spontanous Breathing and Patient connected to face mask oxygen  Post-op Assessment: Report given to PACU RN and Post -op Vital signs reviewed and stable  Post vital signs: Reviewed and stable  Complications: No apparent anesthesia complications

## 2013-03-03 NOTE — Anesthesia Postprocedure Evaluation (Signed)
  Anesthesia Post-op Note  Patient: Karen Ferguson  Procedure(s) Performed: Procedure(s): RIGHT DEBRIDEMENT AND TENOLYSIS OF PERONEOUS LONGOUS AND BREVIS TENDONS  (Right)  Patient Location: PACU  Anesthesia Type:GA combined with regional for post-op pain  Level of Consciousness: awake and alert   Airway and Oxygen Therapy: Patient Spontanous Breathing  Post-op Pain: none  Post-op Assessment: Post-op Vital signs reviewed, Patient's Cardiovascular Status Stable, Respiratory Function Stable, Patent Airway and No signs of Nausea or vomiting  Post-op Vital Signs: Reviewed and stable  Complications: No apparent anesthesia complications

## 2013-03-03 NOTE — Progress Notes (Signed)
Assisted Dr. Fitzgerald with right, ultrasound guided, popliteal/saphenous block. Side rails up, monitors on throughout procedure. See vital signs in flow sheet. Tolerated Procedure well. 

## 2013-03-03 NOTE — Brief Op Note (Signed)
03/03/2013  10:07 AM  PATIENT:  Achille Rich  57 y.o. female  PRE-OPERATIVE DIAGNOSIS:   1.  Right peroneus brevis tendon tear      2.  Right peroneus longus tendon tenosynovitis  POST-OPERATIVE DIAGNOSIS:  same  Procedure(s): 1.  Right peroneus longus tenolysis 2.  Right peroneus brevis tenolysis and debridement of tear  SURGEON:  Toni Arthurs, MD  ASSISTANT: Lorin Picket Flowers, PA-C  ANESTHESIA:   General, regional  EBL:  minimal   TOURNIQUET:   Total Tourniquet Time Documented: Thigh (Right) - 28 minutes Total: Thigh (Right) - 28 minutes   COMPLICATIONS:  None apparent  DISPOSITION:  Extubated, awake and stable to recovery.  DICTATION ID:  [id number]

## 2013-03-03 NOTE — H&P (Signed)
Karen Ferguson is an 57 y.o. female.   Chief Complaint:  Right ankle pain HPI:  57 y/o female with inversion ankle injury at work.  She has failed nonoperative treatment and presents now for debridement and repair of her peroneal tendons.  Past Medical History  Diagnosis Date  . Arthritis   . Depression   . Hypertension   . Asthma   . Bronchitis   . Degenerative disc disease     Past Surgical History  Procedure Laterality Date  . Carpal tunnel release  1985    rt  . Hemorrhoid surgery  1986  . Abdominal hysterectomy  1988    for fibroid tumors  . Cholecystectomy  1989    Family History  Problem Relation Age of Onset  . Arthritis Mother   . Hypertension Mother   . Diabetes Mother   . Kidney disease Mother   . Arthritis Sister   . Hypertension Brother   . Heart disease Maternal Aunt   . Heart disease Maternal Uncle   . Cancer Maternal Uncle   . Heart disease Maternal Grandfather    Social History:  reports that she has never smoked. She has never used smokeless tobacco. She reports that she does not drink alcohol or use illicit drugs.  Allergies: No Known Allergies  Medications Prior to Admission  Medication Sig Dispense Refill  . acetaminophen (TYLENOL) 325 MG tablet Take 650 mg by mouth every 6 (six) hours as needed.      . cholecalciferol (VITAMIN D) 1000 UNITS tablet Take 1,000 Units by mouth daily.      . hydrochlorothiazide (HYDRODIURIL) 25 MG tablet Take 1 tablet (25 mg total) by mouth daily.  90 tablet  3  . albuterol (PROVENTIL HFA;VENTOLIN HFA) 108 (90 BASE) MCG/ACT inhaler Inhale 2 puffs into the lungs every 4 (four) hours as needed for wheezing.  2 Inhaler  3  . ibuprofen (ADVIL,MOTRIN) 200 MG tablet Take 200 mg by mouth every 6 (six) hours as needed.        Results for orders placed during the hospital encounter of 03/03/13 (from the past 48 hour(s))  BASIC METABOLIC PANEL     Status: Abnormal   Collection Time    03/01/13  1:45 PM      Result Value  Range   Sodium 141  135 - 145 mEq/L   Potassium 3.8  3.5 - 5.1 mEq/L   Chloride 101  96 - 112 mEq/L   CO2 34 (*) 19 - 32 mEq/L   Glucose, Bld 75  70 - 99 mg/dL   BUN 13  6 - 23 mg/dL   Creatinine, Ser 1.30  0.50 - 1.10 mg/dL   Calcium 9.6  8.4 - 86.5 mg/dL   GFR calc non Af Amer 74 (*) >90 mL/min   GFR calc Af Amer 85 (*) >90 mL/min   Comment: (NOTE)     The eGFR has been calculated using the CKD EPI equation.     This calculation has not been validated in all clinical situations.     eGFR's persistently <90 mL/min signify possible Chronic Kidney     Disease.   No results found.  ROS  No recent f/c/n/v/wt loss  Blood pressure 164/94, pulse 62, temperature 98.3 F (36.8 C), temperature source Oral, resp. rate 20, height 5\' 5"  (1.651 m), weight 95.255 kg (210 lb), SpO2 98.00%. Physical Exam  wn wd female in nad.  A and O x 4.  Mood and affect normal.  EOMI.  resp unlabored.  Right ankle with TTP at peroneal tendon sheath.  Skin heatlhy and intact.  5/5 strength in PF, DF, inversion and eversion.  Full ROM at ankle.  Sens to LT intact in sural n dist.  Assessment/Plan Right ankle peroneal tendon tear - to OR for debridement and repair of peroneal tendons.  The risks and benefits of the alternative treatment options have been discussed in detail.  The patient wishes to proceed with surgery and specifically understands risks of bleeding, infection, nerve damage, blood clots, need for additional surgery, amputation and death.   Toni Arthurs 03-10-13, 8:29 AM

## 2013-03-04 ENCOUNTER — Encounter (HOSPITAL_BASED_OUTPATIENT_CLINIC_OR_DEPARTMENT_OTHER): Payer: Self-pay | Admitting: Orthopedic Surgery

## 2013-03-04 NOTE — Telephone Encounter (Signed)
Left message on patient voicemail letting her know that if she's needing any refills in the future to have her pharmacist request it and that I would send this to Dr Aviva Signs. Mateus Rewerts, Virgel Bouquet

## 2013-03-04 NOTE — Op Note (Signed)
NAMEELIANA, LUETH NO.:  [id number]  MEDICAL RECORD NO.:  [id number]  LOCATION:  [facility]                         FACILITY:  [facility]  PHYSICIAN:  Toni Arthurs, MD        DATE OF BIRTH:  [birth year]  DATE OF PROCEDURE:  03/03/2013 DATE OF DISCHARGE:  03/03/2012                              OPERATIVE REPORT   PREOPERATIVE DIAGNOSES: 1. Right ankle peroneus brevis tendon tear. 2. Right peroneus longus tendon tenosynovitis.  POSTOPERATIVE DIAGNOSES: 1. Right ankle peroneus brevis tendon tear. 2. Right peroneus longus tendon tenosynovitis.  PROCEDURE: 1. Right peroneus longus tenolysis. 2. Right peroneus brevis tenolysis and debridement of tendon tear.  SURGEON:  Toni Arthurs, MD  ASSISTANT:  Lorin Picket Flowers, PA-C.  ANESTHESIA:  General, regional.  ESTIMATED BLOOD LOSS:  Minimal.  TOURNIQUET TIME:  28 minutes at 220 mmHg.  COMPLICATIONS:  None apparent.  DISPOSITION:  Extubated, awake and stable to recovery.  INDICATIONS FOR PROCEDURE:  The patient is a 57 year old woman who sustained an ankle injury at work earlier this year.  She has failed nonoperative treatment today including immobilization, activity modification, oral anti-inflammatories and physical therapy.  She presents now for operative treatment of this condition.  She understands the risks and benefits, the alternative treatment options and elects surgical treatment.  She specifically understands risks of bleeding, infection, nerve damage, blood clots, need for additional surgery, amputation, and death.  PROCEDURE IN DETAIL:  After preoperative consent was obtained and the correct operative site was identified, the patient was brought to the operating room and placed supine on the operating table.  General anesthesia was induced.  Preoperative antibiotics were administered. Surgical time-out was taken.  The patient was then turned into the lateral decubitus position with the right side up.  The  right lower extremity was prepped and draped in standard sterile fashion with tourniquet around the thigh.  The extremity was exsanguinated and tourniquet was inflated to 220 mmHg.  A curvilinear incision was marked on the skin over the peroneal tendons.  Sharp dissection was carried down through the skin and subcutaneous tissue.  The peroneal tendon sheath was identified.  It was noted to be quite swollen.  The sheath was incised and the superior peroneal retinaculum was released from its insertion on the posterior edge of the fibula.  The peroneus longus tendon was carefully inspected.  It was noted to have extensive tenosynovitis proximally and distally.  This was all debrided carefully with tenotomy scissors from the tendon as well as from the tendon sheath.  The peroneus brevis tendon was then carefully inspected.  It was quite flattened with degenerative tears of both the leading and trailing edges.  The central portion of the tendon though appeared generally healthy.  The muscle belly was relatively low lying and entered the peroneal sheath at the distal tip of the fibula.  This muscle belly was debrided back, freeing the tendon up within the sheath. The tenosynovitis was also debrided with tenotomy scissors, proximally and distally.  The peroneal tubercle was noted to not be very prominent, but the divider between the 2 tendons was excised in its entirety.  The wound was irrigated  copiously.  The superior peroneal retinaculum was repaired back to its insertion point with imbricating sutures of 0 Vicryl through holes in the bone.  The remainder of the peroneal tendon sheath was closed with simple sutures of 2-0 Vicryl.  The subcutaneous tissue was approximated with inverted simple sutures of 3-0 Monocryl and a running 3-0 nylon was used to close the skin incision.  Sterile dressings were applied, followed by a well-padded short-leg splint. Tourniquet was released at 28 minutes  after application of the dressings.  The patient was awakened from anesthesia and transported to the recovery room in stable condition.  FOLLOWUP PLAN:  The patient will be nonweightbearing on the right lower extremity for the next couple of weeks.  She will follow up with me in for suture removal and conversion to a Cam walker boot.  She will also initiate active plantar flexion, dorsiflexion, range of motion at that time.  Scott Flowers PA-C was present and scrubbed for the duration of the case.  His assistance was critical in gaining and maintaining exposure performing the operation, applying dressings and a splint.     Toni Arthurs, MD     JH/MEDQ  D:  03/03/2013  T:  03/04/2013  Job:  [id number]

## 2013-03-04 NOTE — Op Note (Deleted)
NAME:  Karen Ferguson, Karen Ferguson                  ACCOUNT NO.:  [id number]  MEDICAL RECORD NO.:  [id number]  LOCATION:  [facility]                         FACILITY:  [facility]  PHYSICIAN:  Pritesh Sobecki, MD        DATE OF BIRTH:  06/27/1955  DATE OF PROCEDURE:  03/03/2013 DATE OF DISCHARGE:  03/03/2012                              OPERATIVE REPORT   PREOPERATIVE DIAGNOSES: 1. Right ankle peroneus brevis tendon tear. 2. Right peroneus longus tendon tenosynovitis.  POSTOPERATIVE DIAGNOSES: 1. Right ankle peroneus brevis tendon tear. 2. Right peroneus longus tendon tenosynovitis.  PROCEDURE: 1. Right peroneus longus tenolysis. 2. Right peroneus brevis tenolysis and debridement of tendon tear.  SURGEON:  Gerrald Basu, MD  ASSISTANT:  Scott Flowers, PA-C.  ANESTHESIA:  General, regional.  ESTIMATED BLOOD LOSS:  Minimal.  TOURNIQUET TIME:  28 minutes at 220 mmHg.  COMPLICATIONS:  None apparent.  DISPOSITION:  Extubated, awake and stable to recovery.  INDICATIONS FOR PROCEDURE:  The patient is a 57-year-old woman who sustained an ankle injury at work earlier this year.  She has failed nonoperative treatment today including immobilization, activity modification, oral anti-inflammatories and physical therapy.  She presents now for operative treatment of this condition.  She understands the risks and benefits, the alternative treatment options and elects surgical treatment.  She specifically understands risks of bleeding, infection, nerve damage, blood clots, need for additional surgery, amputation, and death.  PROCEDURE IN DETAIL:  After preoperative consent was obtained and the correct operative site was identified, the patient was brought to the operating room and placed supine on the operating table.  General anesthesia was induced.  Preoperative antibiotics were administered. Surgical time-out was taken.  The patient was then turned into the lateral decubitus position with the right side up.  The  right lower extremity was prepped and draped in standard sterile fashion with tourniquet around the thigh.  The extremity was exsanguinated and tourniquet was inflated to 220 mmHg.  A curvilinear incision was marked on the skin over the peroneal tendons.  Sharp dissection was carried down through the skin and subcutaneous tissue.  The peroneal tendon sheath was identified.  It was noted to be quite swollen.  The sheath was incised and the superior peroneal retinaculum was released from its insertion on the posterior edge of the fibula.  The peroneus longus tendon was carefully inspected.  It was noted to have extensive tenosynovitis proximally and distally.  This was all debrided carefully with tenotomy scissors from the tendon as well as from the tendon sheath.  The peroneus brevis tendon was then carefully inspected.  It was quite flattened with degenerative tears of both the leading and trailing edges.  The central portion of the tendon though appeared generally healthy.  The muscle belly was relatively low lying and entered the peroneal sheath at the distal tip of the fibula.  This muscle belly was debrided back, freeing the tendon up within the sheath. The tenosynovitis was also debrided with tenotomy scissors, proximally and distally.  The peroneal tubercle was noted to not be very prominent, but the divider between the 2 tendons was excised in its entirety.  The wound was irrigated   copiously.  The superior peroneal retinaculum was repaired back to its insertion point with imbricating sutures of 0 Vicryl through holes in the bone.  The remainder of the peroneal tendon sheath was closed with simple sutures of 2-0 Vicryl.  The subcutaneous tissue was approximated with inverted simple sutures of 3-0 Monocryl and a running 3-0 nylon was used to close the skin incision.  Sterile dressings were applied, followed by a well-padded short-leg splint. Tourniquet was released at 28 minutes  after application of the dressings.  The patient was awakened from anesthesia and transported to the recovery room in stable condition.  FOLLOWUP PLAN:  The patient will be nonweightbearing on the right lower extremity for the next couple of weeks.  She will follow up with me in for suture removal and conversion to a Cam walker boot.  She will also initiate active plantar flexion, dorsiflexion, range of motion at that time.  Scott Flowers PA-C was present and scrubbed for the duration of the case.  His assistance was critical in gaining and maintaining exposure performing the operation, applying dressings and a splint.     Lillieann Pavlich, MD     JH/MEDQ  D:  03/03/2013  T:  03/04/2013  Job:  [id number] 

## 2013-03-22 ENCOUNTER — Ambulatory Visit: Payer: PRIVATE HEALTH INSURANCE | Attending: Orthopedic Surgery | Admitting: Physical Therapy

## 2013-03-22 DIAGNOSIS — M25676 Stiffness of unspecified foot, not elsewhere classified: Secondary | ICD-10-CM | POA: Insufficient documentation

## 2013-03-22 DIAGNOSIS — R609 Edema, unspecified: Secondary | ICD-10-CM | POA: Insufficient documentation

## 2013-03-22 DIAGNOSIS — M25579 Pain in unspecified ankle and joints of unspecified foot: Secondary | ICD-10-CM | POA: Insufficient documentation

## 2013-03-22 DIAGNOSIS — M25673 Stiffness of unspecified ankle, not elsewhere classified: Secondary | ICD-10-CM | POA: Insufficient documentation

## 2013-03-22 DIAGNOSIS — IMO0001 Reserved for inherently not codable concepts without codable children: Secondary | ICD-10-CM | POA: Insufficient documentation

## 2013-03-22 DIAGNOSIS — R262 Difficulty in walking, not elsewhere classified: Secondary | ICD-10-CM | POA: Insufficient documentation

## 2013-03-24 ENCOUNTER — Ambulatory Visit: Payer: PRIVATE HEALTH INSURANCE | Admitting: Physical Therapy

## 2013-03-29 ENCOUNTER — Ambulatory Visit: Payer: PRIVATE HEALTH INSURANCE | Attending: Orthopedic Surgery | Admitting: Physical Therapy

## 2013-03-29 DIAGNOSIS — M25579 Pain in unspecified ankle and joints of unspecified foot: Secondary | ICD-10-CM | POA: Insufficient documentation

## 2013-03-29 DIAGNOSIS — M25673 Stiffness of unspecified ankle, not elsewhere classified: Secondary | ICD-10-CM | POA: Insufficient documentation

## 2013-03-29 DIAGNOSIS — M25676 Stiffness of unspecified foot, not elsewhere classified: Secondary | ICD-10-CM | POA: Insufficient documentation

## 2013-03-29 DIAGNOSIS — R609 Edema, unspecified: Secondary | ICD-10-CM | POA: Insufficient documentation

## 2013-03-29 DIAGNOSIS — R262 Difficulty in walking, not elsewhere classified: Secondary | ICD-10-CM | POA: Insufficient documentation

## 2013-03-29 DIAGNOSIS — IMO0001 Reserved for inherently not codable concepts without codable children: Secondary | ICD-10-CM | POA: Insufficient documentation

## 2013-03-31 ENCOUNTER — Ambulatory Visit: Payer: PRIVATE HEALTH INSURANCE | Admitting: Physical Therapy

## 2013-04-05 ENCOUNTER — Ambulatory Visit: Payer: PRIVATE HEALTH INSURANCE | Admitting: Physical Therapy

## 2013-04-07 ENCOUNTER — Encounter: Payer: PRIVATE HEALTH INSURANCE | Admitting: Physical Therapy

## 2013-04-12 ENCOUNTER — Ambulatory Visit: Payer: PRIVATE HEALTH INSURANCE | Admitting: Physical Therapy

## 2013-04-14 ENCOUNTER — Ambulatory Visit: Payer: PRIVATE HEALTH INSURANCE | Admitting: Physical Therapy

## 2013-04-19 ENCOUNTER — Ambulatory Visit: Payer: PRIVATE HEALTH INSURANCE | Admitting: Physical Therapy

## 2013-04-20 ENCOUNTER — Ambulatory Visit: Payer: PRIVATE HEALTH INSURANCE | Admitting: Physical Therapy

## 2013-04-26 ENCOUNTER — Encounter: Payer: Self-pay | Admitting: Physical Therapy

## 2013-04-27 ENCOUNTER — Ambulatory Visit: Payer: PRIVATE HEALTH INSURANCE | Attending: Orthopedic Surgery | Admitting: Rehabilitation

## 2013-04-27 DIAGNOSIS — M25673 Stiffness of unspecified ankle, not elsewhere classified: Secondary | ICD-10-CM | POA: Insufficient documentation

## 2013-04-27 DIAGNOSIS — R262 Difficulty in walking, not elsewhere classified: Secondary | ICD-10-CM | POA: Insufficient documentation

## 2013-04-27 DIAGNOSIS — M25676 Stiffness of unspecified foot, not elsewhere classified: Secondary | ICD-10-CM | POA: Insufficient documentation

## 2013-04-27 DIAGNOSIS — IMO0001 Reserved for inherently not codable concepts without codable children: Secondary | ICD-10-CM | POA: Insufficient documentation

## 2013-04-27 DIAGNOSIS — M25579 Pain in unspecified ankle and joints of unspecified foot: Secondary | ICD-10-CM | POA: Insufficient documentation

## 2013-04-28 ENCOUNTER — Ambulatory Visit: Payer: PRIVATE HEALTH INSURANCE | Attending: Orthopedic Surgery | Admitting: Physical Therapy

## 2013-04-28 DIAGNOSIS — R262 Difficulty in walking, not elsewhere classified: Secondary | ICD-10-CM | POA: Insufficient documentation

## 2013-04-28 DIAGNOSIS — M25673 Stiffness of unspecified ankle, not elsewhere classified: Secondary | ICD-10-CM | POA: Insufficient documentation

## 2013-04-28 DIAGNOSIS — IMO0001 Reserved for inherently not codable concepts without codable children: Secondary | ICD-10-CM | POA: Insufficient documentation

## 2013-04-28 DIAGNOSIS — M25579 Pain in unspecified ankle and joints of unspecified foot: Secondary | ICD-10-CM | POA: Insufficient documentation

## 2013-04-28 DIAGNOSIS — M25676 Stiffness of unspecified foot, not elsewhere classified: Secondary | ICD-10-CM | POA: Insufficient documentation

## 2013-05-03 ENCOUNTER — Ambulatory Visit: Payer: PRIVATE HEALTH INSURANCE | Admitting: Physical Therapy

## 2013-05-05 ENCOUNTER — Ambulatory Visit: Payer: PRIVATE HEALTH INSURANCE | Admitting: Physical Therapy

## 2013-05-10 ENCOUNTER — Ambulatory Visit: Payer: PRIVATE HEALTH INSURANCE | Attending: Orthopedic Surgery | Admitting: Physical Therapy

## 2013-05-10 DIAGNOSIS — M25579 Pain in unspecified ankle and joints of unspecified foot: Secondary | ICD-10-CM | POA: Insufficient documentation

## 2013-05-10 DIAGNOSIS — M25673 Stiffness of unspecified ankle, not elsewhere classified: Secondary | ICD-10-CM | POA: Insufficient documentation

## 2013-05-10 DIAGNOSIS — R262 Difficulty in walking, not elsewhere classified: Secondary | ICD-10-CM | POA: Insufficient documentation

## 2013-05-10 DIAGNOSIS — R609 Edema, unspecified: Secondary | ICD-10-CM | POA: Insufficient documentation

## 2013-05-10 DIAGNOSIS — IMO0001 Reserved for inherently not codable concepts without codable children: Secondary | ICD-10-CM | POA: Insufficient documentation

## 2013-05-10 DIAGNOSIS — M25676 Stiffness of unspecified foot, not elsewhere classified: Secondary | ICD-10-CM | POA: Insufficient documentation

## 2013-05-12 ENCOUNTER — Ambulatory Visit: Payer: PRIVATE HEALTH INSURANCE

## 2013-05-16 ENCOUNTER — Encounter: Payer: Self-pay | Admitting: Physical Therapy

## 2013-05-17 ENCOUNTER — Ambulatory Visit: Payer: PRIVATE HEALTH INSURANCE | Attending: Orthopedic Surgery | Admitting: Physical Therapy

## 2013-05-23 ENCOUNTER — Encounter: Payer: Self-pay | Admitting: Physical Therapy

## 2013-05-25 ENCOUNTER — Ambulatory Visit: Payer: PRIVATE HEALTH INSURANCE | Attending: Family Medicine

## 2013-05-25 DIAGNOSIS — M25579 Pain in unspecified ankle and joints of unspecified foot: Secondary | ICD-10-CM | POA: Insufficient documentation

## 2013-05-25 DIAGNOSIS — R262 Difficulty in walking, not elsewhere classified: Secondary | ICD-10-CM | POA: Insufficient documentation

## 2013-05-25 DIAGNOSIS — M25673 Stiffness of unspecified ankle, not elsewhere classified: Secondary | ICD-10-CM | POA: Insufficient documentation

## 2013-05-25 DIAGNOSIS — M25676 Stiffness of unspecified foot, not elsewhere classified: Secondary | ICD-10-CM | POA: Insufficient documentation

## 2013-05-25 DIAGNOSIS — R609 Edema, unspecified: Secondary | ICD-10-CM | POA: Insufficient documentation

## 2013-05-25 DIAGNOSIS — IMO0001 Reserved for inherently not codable concepts without codable children: Secondary | ICD-10-CM | POA: Insufficient documentation

## 2013-05-30 ENCOUNTER — Ambulatory Visit: Payer: PRIVATE HEALTH INSURANCE | Attending: Family Medicine | Admitting: Physical Therapy

## 2013-05-30 DIAGNOSIS — M25676 Stiffness of unspecified foot, not elsewhere classified: Secondary | ICD-10-CM | POA: Insufficient documentation

## 2013-05-30 DIAGNOSIS — M25673 Stiffness of unspecified ankle, not elsewhere classified: Secondary | ICD-10-CM | POA: Insufficient documentation

## 2013-05-30 DIAGNOSIS — M25579 Pain in unspecified ankle and joints of unspecified foot: Secondary | ICD-10-CM | POA: Insufficient documentation

## 2013-05-30 DIAGNOSIS — R262 Difficulty in walking, not elsewhere classified: Secondary | ICD-10-CM | POA: Insufficient documentation

## 2013-05-30 DIAGNOSIS — IMO0001 Reserved for inherently not codable concepts without codable children: Secondary | ICD-10-CM | POA: Insufficient documentation

## 2013-05-30 DIAGNOSIS — R609 Edema, unspecified: Secondary | ICD-10-CM | POA: Insufficient documentation

## 2013-06-01 ENCOUNTER — Ambulatory Visit: Payer: PRIVATE HEALTH INSURANCE | Attending: Orthopedic Surgery | Admitting: Physical Therapy

## 2013-06-01 DIAGNOSIS — R262 Difficulty in walking, not elsewhere classified: Secondary | ICD-10-CM | POA: Diagnosis not present

## 2013-06-01 DIAGNOSIS — IMO0001 Reserved for inherently not codable concepts without codable children: Secondary | ICD-10-CM | POA: Insufficient documentation

## 2013-06-01 DIAGNOSIS — M25673 Stiffness of unspecified ankle, not elsewhere classified: Secondary | ICD-10-CM | POA: Diagnosis not present

## 2013-06-01 DIAGNOSIS — M25579 Pain in unspecified ankle and joints of unspecified foot: Secondary | ICD-10-CM | POA: Diagnosis not present

## 2013-06-01 DIAGNOSIS — M25676 Stiffness of unspecified foot, not elsewhere classified: Secondary | ICD-10-CM | POA: Diagnosis not present

## 2014-05-29 ENCOUNTER — Telehealth: Payer: Self-pay | Admitting: Family Medicine

## 2014-05-29 NOTE — Telephone Encounter (Signed)
What kind of test can be done to check circulation in her feet? Her feet are numb, cold and hurting. Please advise

## 2014-05-29 NOTE — Telephone Encounter (Signed)
Could be multiple causes including Diabetic neuropathy, Raynaud's, PAD, etc.  She has not been seen in clinic in over 2 yrs, will need appointment.  Thanks [employer]. Awanda Mink, DO of [facility] 05/29/2014, 3:11 PM

## 2014-06-02 NOTE — Telephone Encounter (Signed)
Advised patient to make an appointment

## 2014-09-27 ENCOUNTER — Telehealth: Payer: Self-pay | Admitting: Family Medicine

## 2014-09-27 DIAGNOSIS — I10 Essential (primary) hypertension: Secondary | ICD-10-CM

## 2014-09-27 MED ORDER — HYDROCHLOROTHIAZIDE 25 MG PO TABS: 25.0000 mg | ORAL_TABLET | Freq: Every day | ORAL | Status: DC

## 2014-09-27 NOTE — Telephone Encounter (Signed)
Needs refill on HCTZ [facility] [address]

## 2014-09-27 NOTE — Telephone Encounter (Signed)
One month refill.  Needs appointment after this.  Thanks [employer]. Awanda Mink, DO of [facility] 09/27/2014, 12:32 PM

## 2014-11-22 ENCOUNTER — Other Ambulatory Visit: Payer: Self-pay | Admitting: Family Medicine

## 2014-12-06 ENCOUNTER — Telehealth: Payer: Self-pay | Admitting: Family Medicine

## 2014-12-06 ENCOUNTER — Other Ambulatory Visit: Payer: Self-pay | Admitting: Family Medicine

## 2014-12-06 DIAGNOSIS — I10 Essential (primary) hypertension: Secondary | ICD-10-CM

## 2014-12-06 DIAGNOSIS — J45901 Unspecified asthma with (acute) exacerbation: Secondary | ICD-10-CM

## 2014-12-06 MED ORDER — HYDROCHLOROTHIAZIDE 25 MG PO TABS: 25.0000 mg | ORAL_TABLET | Freq: Every day | ORAL | Status: DC

## 2014-12-06 MED ORDER — ALBUTEROL SULFATE HFA 108 (90 BASE) MCG/ACT IN AERS: 2.0000 | INHALATION_SPRAY | RESPIRATORY_TRACT | Status: DC | PRN

## 2014-12-06 MED ORDER — ALBUTEROL SULFATE HFA 108 (90 BASE) MCG/ACT IN AERS
2.0000 | INHALATION_SPRAY | RESPIRATORY_TRACT | Status: DC | PRN
Start: 2014-12-06 — End: 2014-12-06

## 2014-12-06 NOTE — Telephone Encounter (Signed)
Per Dr hess's last phone note, patient needs office visit for more refills.  Please have patient schedule appointment.  Called patient but she was not available.  Please attempt to call her back today.

## 2014-12-06 NOTE — Telephone Encounter (Signed)
Karen Ferguson is here because she has been needing a refill on her hydrochlorothiazide, as she ran out last week. The prescription box that she presented before me has the date of 09/27/2014. It looks like this was refilled on 11/23/2014, however, she states that the pharmacy has not received this request and would like for it to be resent if possible. Additionally, she needs a refill on albuterol. Please contact Karen Ferguson once it is complete. Thank you, Fonda Kinder, ASA

## 2014-12-07 ENCOUNTER — Telehealth: Payer: Self-pay | Admitting: Family Medicine

## 2014-12-07 NOTE — Telephone Encounter (Signed)
Left message with daughter.  Rx sent in 7/13 for 30 day supply. Will get her to her appointment.

## 2014-12-07 NOTE — Telephone Encounter (Signed)
LVM for pt to return call to inform her of below and see about getting her scheduled for an appointment so she can get her refills. Karen Ferguson, Tykisha Areola D, [place]

## 2014-12-07 NOTE — Telephone Encounter (Signed)
Will forward to MD. [facility],CMA  

## 2014-12-07 NOTE — Telephone Encounter (Signed)
error 

## 2014-12-07 NOTE — Telephone Encounter (Signed)
Patient made the appt for 12/20/2014. She would like to know if she could have enough of her refills to last her until then. Please inform patient. Thank you, Fonda Kinder, ASA

## 2014-12-20 ENCOUNTER — Encounter (INDEPENDENT_AMBULATORY_CARE_PROVIDER_SITE_OTHER): Payer: Self-pay | Admitting: Family Medicine

## 2014-12-20 DIAGNOSIS — M1990 Unspecified osteoarthritis, unspecified site: Secondary | ICD-10-CM

## 2014-12-20 NOTE — Progress Notes (Signed)
error 

## 2015-01-09 ENCOUNTER — Telehealth: Payer: Self-pay | Admitting: Family Medicine

## 2015-01-09 NOTE — Telephone Encounter (Signed)
Pt would like to have her BP med increased. She is feeling funny Please advise

## 2015-01-10 ENCOUNTER — Encounter: Payer: Self-pay | Admitting: Family Medicine

## 2015-01-10 ENCOUNTER — Ambulatory Visit (INDEPENDENT_AMBULATORY_CARE_PROVIDER_SITE_OTHER): Payer: Self-pay | Admitting: Family Medicine

## 2015-01-10 VITALS — BP 130/74 | HR 67 | Temp 98.4°F | Ht 64.0 in | Wt 208.3 lb

## 2015-01-10 DIAGNOSIS — Z1159 Encounter for screening for other viral diseases: Secondary | ICD-10-CM

## 2015-01-10 DIAGNOSIS — Z13220 Encounter for screening for lipoid disorders: Secondary | ICD-10-CM

## 2015-01-10 DIAGNOSIS — R42 Dizziness and giddiness: Secondary | ICD-10-CM

## 2015-01-10 DIAGNOSIS — I10 Essential (primary) hypertension: Secondary | ICD-10-CM

## 2015-01-10 DIAGNOSIS — Z114 Encounter for screening for human immunodeficiency virus [HIV]: Secondary | ICD-10-CM

## 2015-01-10 LAB — COMPREHENSIVE METABOLIC PANEL
ALBUMIN: 3.9 g/dL (ref 3.6–5.1)
ALT: 26 U/L (ref 6–29)
AST: 23 U/L (ref 10–35)
Alkaline Phosphatase: 71 U/L (ref 33–130)
BUN: 11 mg/dL (ref 7–25)
CHLORIDE: 97 mmol/L — AB (ref 98–110)
CO2: 35 mmol/L — ABNORMAL HIGH (ref 20–31)
CREATININE: 0.72 mg/dL (ref 0.50–0.99)
Calcium: 10 mg/dL (ref 8.6–10.4)
Glucose, Bld: 101 mg/dL — ABNORMAL HIGH (ref 65–99)
Potassium: 4 mmol/L (ref 3.5–5.3)
Sodium: 140 mmol/L (ref 135–146)
TOTAL PROTEIN: 7.1 g/dL (ref 6.1–8.1)
Total Bilirubin: 0.7 mg/dL (ref 0.2–1.2)

## 2015-01-10 LAB — CBC
HCT: 40.8 % (ref 36.0–46.0)
Hemoglobin: 14.2 g/dL (ref 12.0–15.0)
MCH: 32.6 pg (ref 26.0–34.0)
MCHC: 34.8 g/dL (ref 30.0–36.0)
MCV: 93.6 fL (ref 78.0–100.0)
MPV: 10.7 fL (ref 8.6–12.4)
Platelets: 275 10*3/uL (ref 150–400)
RBC: 4.36 MIL/uL (ref 3.87–5.11)
RDW: 13.1 % (ref 11.5–15.5)
WBC: 7 10*3/uL (ref 4.0–10.5)

## 2015-01-10 LAB — LIPID PANEL
Cholesterol: 164 mg/dL (ref 125–200)
HDL: 47 mg/dL (ref >=46)
LDL Cholesterol: 97 mg/dL (ref <130)
TRIGLYCERIDES: 101 mg/dL (ref <150)
Total CHOL/HDL Ratio: 3.5 Ratio (ref <=5.0)
VLDL: 20 mg/dL (ref <30)

## 2015-01-10 NOTE — Patient Instructions (Addendum)
Good to see you today!  Thanks for coming in.  Stop your blood pressure medicine.   Check your blood pressure every so often and write down the reading and bring in to show Dr Lajuana Ripple If your blood pressure is regularly > 150/90 then call us  Your dizziness should get better over the next few days.  If you have trouble seeing out of one eye, or trouble speaking or feel weak in one arm or leg you should go to the ER immediately  I will call you if your lab tests are not normal.  Otherwise Dr Lajuana Ripple will discuss them at your next visit.  Make an appointment to see her in 1-2 weeks

## 2015-01-10 NOTE — Progress Notes (Signed)
   Subjective:    Patient ID: Karen Ferguson, female    DOB: [birth year], 59 y.o.   MRN: [id number]  HPI  DIZZINESS  Feeling dizzy for 3-5 days. Dizziness is lightheadness when she bends over and sometimes when she turns her head Feels like room spins: not really Lightheadedness when stands: sometimes Palpitations or heart racing: no Prior dizziness: no Medications tried: wonders if her blood pressure medication is too low.  Takes HCTZ daily but did not this am Taking blood thinners: no  Symptoms Hearing Loss: no Ear Pain or fullness: sometimes Nausea or vomiting: no Vision difficulty or double vision: no Falls: no Head trauma: no Weakness in arm or leg: no Speaking problems: no Headache: mild frontal ha now   ROS see HPI Smoking Status noted   Numbness of hands Intermittent tingingly.  Has had for years.  Had carpal tunnel release on right.  No focal weakness  Chief Complaint noted Review of Symptoms - see HPI PMH - Smoking status noted.   Vital Signs reviewed   Review of Systems     Objective:   Physical Exam  Alert nad Psych:  Cognition and judgment appear intact. Alert, communicative  and cooperative with normal attention span and concentration. No apparent delusions, illusions, hallucinations Heart - Regular rate and rhythm.  No murmurs, gallops or rubs.    Lungs:  Normal respiratory effort, chest expands symmetrically. Lungs are clear to auscultation, no crackles or wheezes. Extremities:  No cyanosis, edema, or deformity noted with good range of motion of all major joints.   Able to stand and get on exam table without symptoms No symptoms when turns head Ears:  External ear exam shows no significant lesions or deformities.  Otoscopic examination reveals clear canals, tympanic membranes are intact bilaterally without bulging, retraction, inflammation or discharge. Hearing is grossly normal bilaterall Neurologic exam : Cn 2-7 intact Strength equal & normal in  upper & lower extremities Able to stand on heels and toes.   Balance normal  Romberg normal  No real change in blood pressure Mod increase in pulse with orthostatics.  She felt a little dizzy       Assessment & Plan:

## 2015-01-10 NOTE — Assessment & Plan Note (Addendum)
Subacute onset.  No signs of focal CNS disease or cva or cardiopulmonary cause.  Symptoms not very consistent with be inner ear dysfunction but could be.  Leading cause seems to be orthostatic changes.  Will stop blood pressure medication, check labs for anemia and electrolyte abnormality and have her follow up with PCP

## 2015-01-11 LAB — HEPATITIS C ANTIBODY: HCV Ab: NEGATIVE

## 2015-01-11 LAB — HIV ANTIBODY (ROUTINE TESTING W REFLEX): HIV 1&2 Ab, 4th Generation: NONREACTIVE

## 2015-01-11 NOTE — Telephone Encounter (Signed)
It looks like the patient saw Dr Erin Hearing for this problem today. See his office note for further details.  Algis Greenhouse. Jerline Pain, [place] Resident PGY-2 01/11/2015 1:10 PM

## 2015-01-12 NOTE — Telephone Encounter (Signed)
LM with pt family member to call office back as pt was not there. Will try again later. Annie Saephan, CMA.

## 2015-01-12 NOTE — Telephone Encounter (Signed)
Need to know what kind of symptoms she is having when she says she feels funny. Per Dr. Erin Hearing note on 01/10/15 he wanted pt to stop BP med and that her dizziness she resolve a few days later and to FU with PCP in 2 weeks but if nothing improved or changed to call office sooner. Harkirat Orozco, CMA.

## 2015-02-02 ENCOUNTER — Ambulatory Visit: Payer: Self-pay | Admitting: Family Medicine

## 2015-04-13 ENCOUNTER — Encounter: Payer: Self-pay | Admitting: Family Medicine

## 2015-04-13 ENCOUNTER — Telehealth: Payer: Self-pay | Admitting: Family Medicine

## 2015-04-13 NOTE — Telephone Encounter (Signed)
Pt called and would like her test results from 2 months ago. Please call before 3:00 pm today since she has to be at work. jw

## 2015-04-13 NOTE — Telephone Encounter (Signed)
Attempted to call re labs.  LVM.  Will send letter with results.  Please advise patient.

## 2015-04-16 NOTE — Telephone Encounter (Signed)
Spoke to pt. She understood VM but has not received letter yet. I explained that our mail takes a couple of days longer, pt understood and if she doesn't receive the letter this week, she will call us next week. [name], CMA

## 2015-06-29 ENCOUNTER — Emergency Department (HOSPITAL_COMMUNITY)
Admission: EM | Admit: 2015-06-29 | Discharge: 2015-06-29 | Disposition: A | Discharge status: Home / Self Care | Payer: Self-pay | Attending: Emergency Medicine | Admitting: Emergency Medicine

## 2015-06-29 ENCOUNTER — Encounter (HOSPITAL_COMMUNITY): Payer: Self-pay | Admitting: Family Medicine

## 2015-06-29 DIAGNOSIS — Z79899 Other long term (current) drug therapy: Secondary | ICD-10-CM | POA: Insufficient documentation

## 2015-06-29 DIAGNOSIS — Z8659 Personal history of other mental and behavioral disorders: Secondary | ICD-10-CM | POA: Insufficient documentation

## 2015-06-29 DIAGNOSIS — M1990 Unspecified osteoarthritis, unspecified site: Secondary | ICD-10-CM | POA: Insufficient documentation

## 2015-06-29 DIAGNOSIS — I10 Essential (primary) hypertension: Secondary | ICD-10-CM | POA: Insufficient documentation

## 2015-06-29 DIAGNOSIS — J45909 Unspecified asthma, uncomplicated: Secondary | ICD-10-CM | POA: Insufficient documentation

## 2015-06-29 DIAGNOSIS — R252 Cramp and spasm: Secondary | ICD-10-CM | POA: Insufficient documentation

## 2015-06-29 LAB — I-STAT CHEM 8, ED
BUN: 17 mg/dL (ref 6–20)
CALCIUM ION: 1.19 mmol/L (ref 1.13–1.30)
Chloride: 96 mmol/L — ABNORMAL LOW (ref 101–111)
Creatinine, Ser: 0.9 mg/dL (ref 0.44–1.00)
Glucose, Bld: 97 mg/dL (ref 65–99)
HEMATOCRIT: 53 % — AB (ref 36.0–46.0)
HEMOGLOBIN: 18 g/dL — AB (ref 12.0–15.0)
Potassium: 4.4 mmol/L (ref 3.5–5.1)
Sodium: 140 mmol/L (ref 135–145)
TCO2: 32 mmol/L (ref 0–100)

## 2015-06-29 MED ORDER — METHOCARBAMOL 500 MG PO TABS: 500.0000 mg | ORAL_TABLET | Freq: Two times a day (BID) | ORAL | Status: DC

## 2015-06-29 MED ORDER — METHOCARBAMOL 500 MG PO TABS
500.0000 mg | ORAL_TABLET | Freq: Once | ORAL | Status: AC
  Administered 2015-06-29: 500 mg via ORAL
  Filled 2015-06-29: qty 1

## 2015-06-29 NOTE — ED Notes (Signed)
Pt A&XO4, ambulatory at d/c with steady gait, NAD 

## 2015-06-29 NOTE — Discharge Instructions (Signed)

## 2015-06-29 NOTE — ED Provider Notes (Signed)
CSN: [id number]     Arrival date & time 06/29/15  1723 History  By signing my name below, I, [facility], attest that this documentation has been prepared under the direction and in the presence of Etta Quill, NP. Electronically Signed: Virgel Bouquet, ED Scribe. 06/29/2015. 6:41 PM.   Chief Complaint  Patient presents with  . Muscle Pain   The history is provided by the patient. No language interpreter was used.   HPI Comments: Karen Ferguson is a 60 y.o. female with a hx of HTN, asthma, and DDD who presents to the Emergency Department complaining of intermittent, mild, generalized myalgias onset 2 years ago, worse last night and earlier today shortly PTA. Patient reports that she has muscle spasms in her BLE, BUE, and bilateral torso for the past 2 years but states that pain was greater in severity last night when it woke her from sleep and improved until it increased while the patient was at work  in [employer] earlier today. She endorses sleep disturbance secondary to pain and bilateral restless legs. She takes hydrochlorothiazide for management of her HTN. Per pt, she denies being seen by PCP recently for this symptom. She denies CP, SOB, nausea, and vomiting.  Past Medical History  Diagnosis Date  . Arthritis   . Depression   . Hypertension   . Asthma   . Bronchitis   . Degenerative disc disease    Past Surgical History  Procedure Laterality Date  . Carpal tunnel release  1985    rt  . Hemorrhoid surgery  1986  . Abdominal hysterectomy  1988    for fibroid tumors  . Cholecystectomy  1989  . Tendon repair Right 03/03/2013    Procedure: RIGHT DEBRIDEMENT AND TENOLYSIS OF PERONEOUS LONGOUS AND BREVIS TENDONS ;  Surgeon: Wylene Simmer, MD;  Location: [facility];  Service: Orthopedics;  Laterality: Right;   Family History  Problem Relation Age of Onset  . Arthritis Mother   . Hypertension Mother   . Diabetes Mother   . Kidney disease Mother   .  Arthritis Sister   . Hypertension Brother   . Heart disease Maternal Aunt   . Heart disease Maternal Uncle   . Cancer Maternal Uncle   . Heart disease Maternal Grandfather    Social History  Substance Use Topics  . Smoking status: Never Smoker   . Smokeless tobacco: Never Used  . Alcohol Use: No   OB History    No data available     Review of Systems  Respiratory: Negative for shortness of breath.   Cardiovascular: Negative for chest pain.  Gastrointestinal: Negative for nausea and vomiting.  Musculoskeletal: Positive for myalgias (Generalized, worse in BLE and bilateral sides of torso).  All other systems reviewed and are negative.   Allergies  Review of patient's allergies indicates no known allergies.  Home Medications   Prior to Admission medications   Medication Sig Start Date End Date Taking? Authorizing Provider  acetaminophen (TYLENOL) 325 MG tablet Take 650 mg by mouth every 6 (six) hours as needed.    Historical Provider, MD  albuterol (PROVENTIL HFA;VENTOLIN HFA) 108 (90 BASE) MCG/ACT inhaler Inhale 2 puffs into the lungs every 4 (four) hours as needed for wheezing. 12/06/14 12/06/15  Janora Norlander, DO  cholecalciferol (VITAMIN D) 1000 UNITS tablet Take 1,000 Units by mouth daily.    Historical Provider, MD  hydrochlorothiazide (HYDRODIURIL) 25 MG tablet Take 1 tablet (25 mg total) by mouth daily.  12/06/14   Ashly Windell Moulding, DO  ibuprofen (ADVIL,MOTRIN) 200 MG tablet Take 200 mg by mouth every 6 (six) hours as needed.    Historical Provider, MD   BP 126/89 mmHg  Pulse 92  Temp(Src) 98.7 F (37.1 C) (Oral)  Resp 16  SpO2 96% Physical Exam  Constitutional: She is oriented to person, place, and time. She appears well-developed and well-nourished. No distress.  HENT:  Head: Normocephalic and atraumatic.  Eyes: Conjunctivae and EOM are normal.  Neck: Neck supple. No tracheal deviation present.  Cardiovascular: Normal rate.   Pulmonary/Chest: Effort  normal. No respiratory distress.  Musculoskeletal: Normal range of motion.  Neurological: She is alert and oriented to person, place, and time.  Skin: Skin is warm and dry.  Psychiatric: She has a normal mood and affect. Her behavior is normal.  Nursing note and vitals reviewed.   ED Course  Procedures   DIAGNOSTIC STUDIES: Oxygen Saturation is 96% on RA, adequate by my interpretation.    COORDINATION OF CARE: 6:11 PM Will order labs. Discussed treatment plan with pt at bedside and pt agreed to plan.  Labs Review Labs Reviewed  I-STAT CHEM 8, ED - Abnormal; Notable for the following:    Chloride 96 (*)    Hemoglobin 18.0 (*)    HCT 53.0 (*)    All other components within normal limits    Imaging Review No results found. I have personally reviewed and evaluated these images and lab results as part of my medical decision-making.   EKG Interpretation None     Patient with intermittent cramping of arms and legs.  She is on HCTZ for blood pressure. Potassium level checked today and is WNL. MDM   Final diagnoses:  None    Muscle cramps. Care instructions provided. Return precautions discussed. Follow-up with PCP.  I personally performed the services described in this documentation, which was scribed in my presence. The recorded information has been reviewed and is accurate.    Etta Quill, NP 06/30/15 [id number]  Charlesetta Shanks, MD 07/01/15 [phone]

## 2015-06-29 NOTE — ED Notes (Signed)
Pt here for 2 years of intermittent muscle spasms and pain in legs and arms. sts last night was really bad and then today she was downstairs working and felt it.

## 2015-07-03 ENCOUNTER — Ambulatory Visit (INDEPENDENT_AMBULATORY_CARE_PROVIDER_SITE_OTHER): Payer: Self-pay | Admitting: Family Medicine

## 2015-07-03 ENCOUNTER — Encounter: Payer: Self-pay | Admitting: Family Medicine

## 2015-07-03 VITALS — BP 140/73 | HR 67 | Temp 98.5°F | Ht 65.0 in | Wt 203.8 lb

## 2015-07-03 DIAGNOSIS — I10 Essential (primary) hypertension: Secondary | ICD-10-CM

## 2015-07-03 DIAGNOSIS — K219 Gastro-esophageal reflux disease without esophagitis: Secondary | ICD-10-CM | POA: Insufficient documentation

## 2015-07-03 DIAGNOSIS — R208 Other disturbances of skin sensation: Secondary | ICD-10-CM

## 2015-07-03 DIAGNOSIS — R200 Anesthesia of skin: Secondary | ICD-10-CM | POA: Insufficient documentation

## 2015-07-03 DIAGNOSIS — K5900 Constipation, unspecified: Secondary | ICD-10-CM | POA: Insufficient documentation

## 2015-07-03 MED ORDER — OMEPRAZOLE 40 MG PO CPDR: 40.0000 mg | DELAYED_RELEASE_CAPSULE | Freq: Every day | ORAL | Status: DC

## 2015-07-03 NOTE — Assessment & Plan Note (Signed)
Mentions this in passing.  Describes a bowel movement every 2-3 days that is very hard.  Sounds like she drinks only water and actually has been  Good amount of vegetables in her diet. Colace and MiraLAX treat. If the MiraLAX is expensive she can use prune juice instead.

## 2015-07-03 NOTE — Progress Notes (Signed)
Subjective:    Karen Ferguson is a 60 y.o. female who presents to [facility] today for multiple concerns:  1.   Lower extremity complaints: Patient has had what she describes as several years of bilateral leg cramping, coldness, numbness. She states her cramping is worse when she walks. She was previously treated with gabapentin but this was several years ago does not remember if this actually caused any relief. She has also tried ibuprofen and acetaminophen without pain relief. No falls. No trouble with her balance.  2.   Concern about her blood pressure medicine: Patient has been taking hydrochlorothiazide  For past several years  She has been concerned with how much diuresis this causes. She has nocturia 2-3 times a night. She does not ever having this prior to being started on hydrochlorothiazide. She has no chest pain. No lower extremity edema. No shortness of breath on exertion. No palpitations.  3.  Indigestion:   Describes left upper quadrant sharp pain worse after meals. Relieved several hours after meals. She states at some point in the past she ws treated with omeprazole but cannot remember if this helped. She has seen a gastroenterologist in the past for what sounds like esophageal dilation. She says this has been at least 5 may be more years ago. Has typical reflux symptoms of burning in chest  After meals  ROS as above per HPI, otherwise neg.   The following portions of the patient's history were reviewed and updated as appropriate: allergies, current medications, past medical history, family and social history, and problem list. Patient is a nonsmoker.    PMH reviewed.  Past Medical History  Diagnosis Date  . Arthritis   . Depression   . Hypertension   . Asthma   . Bronchitis   . Degenerative disc disease    Past Surgical History  Procedure Laterality Date  . Carpal tunnel release  1985    rt  . Hemorrhoid surgery  1986  . Abdominal hysterectomy  1988    for fibroid tumors  .  Cholecystectomy  1989  . Tendon repair Right 03/03/2013    Procedure: RIGHT DEBRIDEMENT AND TENOLYSIS OF PERONEOUS LONGOUS AND BREVIS TENDONS ;  Surgeon: Wylene Simmer, MD;  Location: [place];  Service: Orthopedics;  Laterality: Right;    Medications reviewed. Current Outpatient Prescriptions  Medication Sig Dispense Refill  . acetaminophen (TYLENOL) 325 MG tablet Take 650 mg by mouth every 6 (six) hours as needed.    Marland Kitchen albuterol (PROVENTIL HFA;VENTOLIN HFA) 108 (90 BASE) MCG/ACT inhaler Inhale 2 puffs into the lungs every 4 (four) hours as needed for wheezing. 1 Inhaler 0  . cholecalciferol (VITAMIN D) 1000 UNITS tablet Take 1,000 Units by mouth daily.    . hydrochlorothiazide (HYDRODIURIL) 25 MG tablet Take 1 tablet (25 mg total) by mouth daily. 30 tablet 0  . ibuprofen (ADVIL,MOTRIN) 200 MG tablet Take 200 mg by mouth every 6 (six) hours as needed.    . methocarbamol (ROBAXIN) 500 MG tablet Take 1 tablet (500 mg total) by mouth 2 (two) times daily. 20 tablet 0   No current facility-administered medications for this visit.     Objective:   Physical Exam BP 140/73 mmHg  Pulse 67  Temp(Src) 98.5 F (36.9 C) (Oral)  Ht 5\' 5"  (1.651 m)  Wt 203 lb 12.8 oz (92.443 kg)  BMI 33.91 kg/m2 Gen:  Alert, cooperative patient who appears stated age in no acute distress.  Vital signs reviewed. HEENT: EOMI,  MMM Cardiac:  Regular rate and rhythm  Pulm:  Clear to auscultation bilaterally  Abd:  Soft/nondistended/nontender.    Musculoskeletal: Legs are nontender to palpation bilaterally. Exts: Non edematous BL  LE, warm and well perfused.   Unable to palpate DP pulses bilaterally. Neuro: bilateral feet and legs have  Sensation intact to gross touch, light touch, pain.  No results found for this or any previous visit (from the past 72 hour(s)).

## 2015-07-03 NOTE — Assessment & Plan Note (Signed)
Gross perception, light touch and receptivity to pain are all intact today.  I think this might actually be lack of circulation /poor circulation rather than neuropathic pain based on description physical exam. I'm unable to palpate any distal pulses. Recommended she follow-up pharmacy clinic for evaluation for PAD.

## 2015-07-03 NOTE — Patient Instructions (Signed)
For now stop your blood pressure medicine. We'll see if that helps with cramping. Come back in 2-3 weeks for a nurse visit. They will recheck your blood pressure at that point.   On your way out also make an appointment with pharmacy clinic. They will check the circulation in your legs.  Take Colace 2 pills a day for the next week. At that point you can drop down to 1 a day if you're having regular bowel movements.   Also you can try MiraLAX which is over-the-counter for constipation. If this is too expensive you can buy prune juice which will accomplish the same thing.   Take the omeprazole once daily for indigestion.  If you have any shortness of breath, fevers, chest pain, or worsening symptoms, please come back immediately or go to the ER after hours.  It was good to see you today!

## 2015-07-03 NOTE — Assessment & Plan Note (Signed)
Has been consistently taking her blood pressure medicine over the past several months to years. Her blood pressure has been normotensive for at least the past several years.  we are going to stop her hydrochlorothiazide totally today. Follow-up with nurse visit in 2-3 weeks for blood pressure check.  if she is hypertensive at that visit we can add something like Norvasc instead of hydrochlorothiazide to prevent further concerns for over-diuresis.

## 2015-07-03 NOTE — Assessment & Plan Note (Addendum)
No red flags.  No recent weight loss. This is not a new diagnosis she has been diagnosed with GERD previously in the past.  Treatment with omeprazole today. She is to follow-up if this does not cause improvement. At that point she will probably need referral back to gastroenterology for esophageal dilatation

## 2015-07-12 ENCOUNTER — Ambulatory Visit (INDEPENDENT_AMBULATORY_CARE_PROVIDER_SITE_OTHER): Payer: Self-pay | Admitting: *Deleted

## 2015-07-12 ENCOUNTER — Ambulatory Visit (INDEPENDENT_AMBULATORY_CARE_PROVIDER_SITE_OTHER): Payer: Self-pay | Admitting: Pharmacist

## 2015-07-12 ENCOUNTER — Encounter: Payer: Self-pay | Admitting: Pharmacist

## 2015-07-12 VITALS — BP 159/75 | HR 95 | Ht 65.75 in | Wt 213.0 lb

## 2015-07-12 DIAGNOSIS — R200 Anesthesia of skin: Secondary | ICD-10-CM

## 2015-07-12 DIAGNOSIS — R208 Other disturbances of skin sensation: Secondary | ICD-10-CM

## 2015-07-12 DIAGNOSIS — Z136 Encounter for screening for cardiovascular disorders: Secondary | ICD-10-CM

## 2015-07-12 DIAGNOSIS — Z0130 Encounter for examination of blood pressure without abnormal findings: Secondary | ICD-10-CM

## 2015-07-12 DIAGNOSIS — I10 Essential (primary) hypertension: Secondary | ICD-10-CM

## 2015-07-12 MED ORDER — GABAPENTIN 300 MG PO CAPS: 300.0000 mg | ORAL_CAPSULE | Freq: Three times a day (TID) | ORAL | Status: DC

## 2015-07-12 NOTE — Progress Notes (Signed)
Patient ID: Karen Ferguson, female   DOB: [birth year], 60 y.o.   MRN: [id number] Reviewed: Agree with Dr. Graylin Shiver documentation and management.

## 2015-07-12 NOTE — Assessment & Plan Note (Signed)
Hypertension:  Patient was previously prescribed hydrochlorothiazide for blood pressure.  Hydrochlorothiazide was discontinued on 07/03/15 as patient had been normotensive for the past several years and patient had complaints of nocturia 2-3 times per night while on hydrochlorothiazide.  Blood pressure today was elevated to 159/75.  Patient was very apprehensive about ABI test and was tearful.  Reevaluate blood pressure next visit.

## 2015-07-12 NOTE — Progress Notes (Signed)
   Patient in nurse clinic for blood pressure check.  Patient also was scheduled to see Dr. Valentina Lucks.  Dr. Valentina Lucks will be taking and recording patient's vital signs.  Derl Barrow, RN

## 2015-07-12 NOTE — Assessment & Plan Note (Signed)
Inconclusive evaluation for PAD given elevated lower extremity readings.  Low likelihood of PAD based on palpable pulse of exam in feet bilaterally, continuous symptoms and new worsening of back and upper leg involvement.  Discussed with Dr. Erin Hearing, will restart trial of gabapentin 300 mg TID as previously prescribed by Dr. Martinique.

## 2015-07-12 NOTE — Progress Notes (Signed)
S:    Patient arrives ambulating without assistance.  She presents to the clinic for PADABI evaluation.  Patient was referred on 07/03/15.  Patient was last seen by Primary Care Provider on 07/03/15.   Reports pain with walking. Patient describes persistent lower leg pain over the last several years.  Patient reports pain sometimes starts at her hips and reports cramping pain in her legs.  Pain is described as tingling, cold, numb, cramping which occurs at all times.  Patient reports nothing helps improve pain.  Patient reports pain continues while at rest.  Reports pain worsens when walking up stairs.   Pain is localized to lower leg but does report pain in upper leg and lower back over the past week.    Patient reports previous trial of low dose gabapentin and reports it did not help.   States she did not take gabapentin for an extended period of time (possibly only 1 prescription - 1 month).   O:  Lower extremity Physical Exam includes palpable dorsalis pedis, absence of limb hair, bilaterally.  ABI overall = Lower extremity measurements higher than arm - likely due to calcifications.  Right Arm 164 mmHg    Left Arm 184 mmHg Right ankle posterior tibial 170 mmHg     dorsalis pedis 210 mmHg Left ankle posterior tibial 148 mmHg    dorsalis pedis 188 mmHg   A/P: Inconclusive evaluation for PAD given elevated lower extremity readings.  Low likelihood of PAD based on palpable pulse of exam in feet bilaterally, continuous symptoms and new worsening of back and upper leg involvement.  Discussed with Dr. Erin Hearing, will restart trial of gabapentin 300 mg TID as previously prescribed by Dr. Martinique.    Hypertension:  Patient was previously prescribed hydrochlorothiazide for blood pressure.  Hydrochlorothiazide was discontinued on 07/03/15 as patient had been normotensive for the past several years and patient had complaints of nocturia 2-3 times per night while on hydrochlorothiazide.  Blood pressure today  was elevated to 159/75.  Patient was very apprehensive about ABI test and was tearful.  Reevaluate blood pressure next visit.    Results reviewed and written information provided.   F/U Clinic Visit with Dr. Lajuana Ripple.    Total time in face-to-face counseling 20 minutes.  Patient seen with Phillis Knack, PharmD Candidate, Stephens November, PharmD Resident, and Elisabeth Most, PharmD Resident.

## 2015-07-12 NOTE — Patient Instructions (Addendum)
Thank you for coming in today!  Your ankle brachial index was normal.   Start taking gabapentin 300mg  three times daily. Be aware this medication may make you sleepy.  Follow up with Dr. Lajuana Ripple in 3 to 4 weeks.

## 2015-08-03 ENCOUNTER — Ambulatory Visit: Payer: Self-pay | Admitting: Family Medicine

## 2015-10-23 ENCOUNTER — Telehealth: Payer: Self-pay | Admitting: *Deleted

## 2015-10-23 NOTE — Telephone Encounter (Signed)
Pt request a callback from Nurse or MD.  States that "my BP med was changed at my last visit but I never came to pick them up" when I asked if she was still taking ant BP meds she said "I never came to pick them up I need to speak with the nurse". Elmus Mathes, Salome Spotted, CMA

## 2015-10-23 NOTE — Telephone Encounter (Signed)
Tried to contact pt at home number and phone only rang with no option to LVM, also tried cell phone and the message was not the pt name so did not leave a message. If pt calls back please let her speak to one of the white team cma's to discuss below. Katharina Caper, April D, [place]

## 2015-11-08 NOTE — Telephone Encounter (Signed)
Tried to contact pt and phone only rang, no option to LVM.  Calling to inquire about below message. Katharina Caper, Ravon Mortellaro D, [place]

## 2015-11-13 NOTE — Telephone Encounter (Signed)
Called pt. No option to leave message. [name], CMA

## 2015-12-18 ENCOUNTER — Telehealth: Payer: Self-pay | Admitting: *Deleted

## 2015-12-18 NOTE — Telephone Encounter (Signed)
Patient was supposed to come back in in feb/march of this year for blood pressure follow up and check.  She had been discontinued on her BP medications.  Please have her schedule a follow up or at minimum come in for a BP check before we refill BP medications.

## 2015-12-18 NOTE — Telephone Encounter (Signed)
Pt needs refill on BP medicine. Please call pt back when done. Deseree Kennon Holter, CMA

## 2015-12-19 NOTE — Telephone Encounter (Signed)
Called pt. No option to leave a message.See Dr. Lajuana Ripple note. Pt needs to come in for refills on BP meds.  [name], CMA

## 2015-12-19 NOTE — Telephone Encounter (Signed)
Contacted pt and she has an appointment for this on 12/28/15 with Dr. Lindell Noe due to PCP not having any availability. Katharina Caper, Maveryck Bahri D, [place]

## 2015-12-28 ENCOUNTER — Ambulatory Visit (INDEPENDENT_AMBULATORY_CARE_PROVIDER_SITE_OTHER): Payer: Self-pay | Admitting: Family Medicine

## 2015-12-28 ENCOUNTER — Telehealth: Payer: Self-pay | Admitting: Family Medicine

## 2015-12-28 ENCOUNTER — Encounter: Payer: Self-pay | Admitting: Family Medicine

## 2015-12-28 VITALS — BP 132/82 | HR 80 | Temp 98.3°F | Ht 65.0 in | Wt 204.0 lb

## 2015-12-28 DIAGNOSIS — I10 Essential (primary) hypertension: Secondary | ICD-10-CM

## 2015-12-28 DIAGNOSIS — M25562 Pain in left knee: Secondary | ICD-10-CM

## 2015-12-28 DIAGNOSIS — R0683 Snoring: Secondary | ICD-10-CM

## 2015-12-28 DIAGNOSIS — G4733 Obstructive sleep apnea (adult) (pediatric): Secondary | ICD-10-CM | POA: Insufficient documentation

## 2015-12-28 DIAGNOSIS — G8929 Other chronic pain: Secondary | ICD-10-CM

## 2015-12-28 DIAGNOSIS — M25561 Pain in right knee: Secondary | ICD-10-CM

## 2015-12-28 MED ORDER — AMLODIPINE BESYLATE 5 MG PO TABS
5.0000 mg | ORAL_TABLET | Freq: Every day | ORAL | 3 refills | Status: DC
Start: 2015-12-28 — End: 2021-05-30

## 2015-12-28 MED FILL — AMLODIPINE BESYLATE 5 MG TA: 5 | 90 days supply | Qty: 90 | Fill #0
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
## 2015-12-28 NOTE — Patient Instructions (Signed)
It was a pleasure to see you today! Someone will call you to schedule the sleep apnea/snoring test.  Start taking norvasc(amlodipene) 5mg  every day.  Please try to work on the [employer]. You can search it on the [facility] website as well as looking at the info below.   DASH Eating Plan DASH stands for "Dietary Approaches to Stop Hypertension." The DASH eating plan is a healthy eating plan that has been shown to reduce high blood pressure (hypertension). Additional health benefits may include reducing the risk of type 2 diabetes mellitus, heart disease, and stroke. The DASH eating plan may also help with weight loss. WHAT DO I NEED TO KNOW ABOUT THE DASH EATING PLAN? For the DASH eating plan, you will follow these general guidelines:  Choose foods with a percent daily value for sodium of less than 5% (as listed on the food label).  Use salt-free seasonings or herbs instead of table salt or sea salt.  Check with your health care provider or pharmacist before using salt substitutes.  Eat lower-sodium products, often labeled as "lower sodium" or "no salt added."  Eat fresh foods.  Eat more vegetables, fruits, and low-fat dairy products.  Choose whole grains. Look for the word "whole" as the first word in the ingredient list.  Choose fish and skinless chicken or [place] more often than red meat. Limit fish, poultry, and meat to 6 oz (170 g) each day.  Limit sweets, desserts, sugars, and sugary drinks.  Choose heart-healthy fats.  Limit cheese to 1 oz (28 g) per day.  Eat more home-cooked food and less restaurant, buffet, and fast food.  Limit fried foods.  Cook foods using methods other than frying.  Limit canned vegetables. If you do use them, rinse them well to decrease the sodium.  When eating at a restaurant, ask that your food be prepared with less salt, or no salt if possible. WHAT FOODS CAN I EAT? Seek help from a dietitian for individual calorie needs. Grains Whole grain or  whole wheat bread. Brown rice. Whole grain or whole wheat pasta. Quinoa, bulgur, and whole grain cereals. Low-sodium cereals. Corn or whole wheat flour tortillas. Whole grain cornbread. Whole grain crackers. Low-sodium crackers. Vegetables Fresh or frozen vegetables (raw, steamed, roasted, or grilled). Low-sodium or reduced-sodium tomato and vegetable juices. Low-sodium or reduced-sodium tomato sauce and paste. Low-sodium or reduced-sodium canned vegetables.  Fruits All fresh, canned (in natural juice), or frozen fruits. Meat and Other Protein Products Ground beef (85% or leaner), grass-fed beef, or beef trimmed of fat. Skinless chicken or [place]. Ground chicken or [place]. Pork trimmed of fat. All fish and seafood. Eggs. Dried beans, peas, or lentils. Unsalted nuts and seeds. Unsalted canned beans. Dairy Low-fat dairy products, such as skim or 1% milk, 2% or reduced-fat cheeses, low-fat ricotta or cottage cheese, or plain low-fat yogurt. Low-sodium or reduced-sodium cheeses. Fats and Oils Tub margarines without trans fats. Light or reduced-fat mayonnaise and salad dressings (reduced sodium). Avocado. Safflower, olive, or canola oils. Natural peanut or almond butter. Other Unsalted popcorn and pretzels. The items listed above may not be a complete list of recommended foods or beverages. Contact your dietitian for more options. WHAT FOODS ARE NOT RECOMMENDED? Grains White bread. White pasta. White rice. Refined cornbread. Bagels and croissants. Crackers that contain trans fat. Vegetables Creamed or fried vegetables. Vegetables in a cheese sauce. Regular canned vegetables. Regular canned tomato sauce and paste. Regular tomato and vegetable juices. Fruits Dried fruits. Canned fruit in light or  heavy syrup. Fruit juice. Meat and Other Protein Products Fatty cuts of meat. Ribs, chicken wings, bacon, sausage, bologna, salami, chitterlings, fatback, hot dogs, bratwurst, and packaged luncheon meats.  Salted nuts and seeds. Canned beans with salt. Dairy Whole or 2% milk, cream, half-and-half, and cream cheese. Whole-fat or sweetened yogurt. Full-fat cheeses or blue cheese. Nondairy creamers and whipped toppings. Processed cheese, cheese spreads, or cheese curds. Condiments Onion and garlic salt, seasoned salt, table salt, and sea salt. Canned and packaged gravies. Worcestershire sauce. Tartar sauce. Barbecue sauce. Teriyaki sauce. Soy sauce, including reduced sodium. Steak sauce. Fish sauce. Oyster sauce. Cocktail sauce. Horseradish. Ketchup and mustard. Meat flavorings and tenderizers. Bouillon cubes. Hot sauce. Tabasco sauce. Marinades. Taco seasonings. Relishes. Fats and Oils Butter, stick margarine, lard, shortening, ghee, and bacon fat. Coconut, palm kernel, or palm oils. Regular salad dressings. Other Pickles and olives. Salted popcorn and pretzels. The items listed above may not be a complete list of foods and beverages to avoid. Contact your dietitian for more information. WHERE CAN I FIND MORE INFORMATION? National Heart, Lung, and Blood Institute: travelstabloid.com   This information is not intended to replace advice given to you by your health care provider. Make sure you discuss any questions you have with your health care provider.   Document Released: 05/01/2011 Document Revised: 06/02/2014 Document Reviewed: 03/16/2013 Elsevier Interactive Patient Education [employer].
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
## 2015-12-28 NOTE — Assessment & Plan Note (Addendum)
BP elevated on exam today, as well as at visit in Feb, will start 5mg  norvasc and pt will try DASH diet. Some concern for HTN 2/2 OSA, ordered split night sleep study today.

## 2015-12-28 NOTE — Telephone Encounter (Signed)
Attempted to call patient on her cell phone [phone] to schedule follow up in 3 months. If she returns call, please tell her that would would like to schedule a follow up in 3 months to discuss her blood pressure and sleep study results. Please help her schedule this with her PCP or White team.

## 2016-01-01 ENCOUNTER — Ambulatory Visit (INDEPENDENT_AMBULATORY_CARE_PROVIDER_SITE_OTHER): Payer: Self-pay | Admitting: Sports Medicine

## 2016-01-01 ENCOUNTER — Encounter: Payer: Self-pay | Admitting: Sports Medicine

## 2016-01-01 ENCOUNTER — Ambulatory Visit
Admission: RE | Admit: 2016-01-01 | Discharge: 2016-01-01 | Disposition: A | Discharge status: Home / Self Care | Payer: No Typology Code available for payment source | Source: Ambulatory Visit | Attending: Sports Medicine | Admitting: Sports Medicine

## 2016-01-01 VITALS — BP 127/47 | HR 60 | Ht 65.0 in | Wt 204.0 lb

## 2016-01-01 DIAGNOSIS — M25562 Pain in left knee: Principal | ICD-10-CM

## 2016-01-01 DIAGNOSIS — M25561 Pain in right knee: Secondary | ICD-10-CM

## 2016-01-01 MED ORDER — IBUPROFEN 600 MG PO TABS: 600.0000 mg | ORAL_TABLET | Freq: Two times a day (BID) | ORAL | 1 refills | Status: DC | PRN

## 2016-01-01 MED ORDER — TRAMADOL HCL 50 MG PO TABS: 50.0000 mg | ORAL_TABLET | Freq: Two times a day (BID) | ORAL | 1 refills | Status: DC | PRN

## 2016-01-01 MED FILL — IBUPROFEN 600 MG TABLET: 600 | 30 days supply | Qty: 60 | Fill #0

## 2016-01-01 MED FILL — traMADol HCL 50 MG TABS: 50 | 30 days supply | Qty: 60 | Fill #0

## 2016-01-01 NOTE — Progress Notes (Signed)
   Subjective:    Patient ID: Karen Ferguson, female    DOB: [birth year], 60 y.o.   MRN: [id number]  HPI chief complaint: Bilateral knee pain, left foot pain  60 year old female comes in today complaining of chronic bilateral knee pain and left foot pain. No trauma but a gradual onset of pain over the past several years. X-rays of both knees done in 2013 showed only some mild degenerative changes. Her pain is diffuse throughout the anterior knee. Worse with activity. She's not noticed any swelling. No prior knee surgeries. She has only taken Tylenol for her pain. This has not been helpful. She is also complaining of chronic left foot pain, primarily along the lateral foot. Worse with activity. Improves some at rest. No swelling. No numbness or tingling.    Review of Systems    as above Objective:   Physical Exam  Well-developed, well-nourished. No acute distress. Sitting comfortably in the exam room  Examination of both knees shows full range of motion. No effusion. She is tender to palpation along both medial and lateral joint lines but has a negative McMurray's bilaterally. Trace patellofemoral crepitus. Good joint stability. No palpable Baker's cyst. Neurovascularly intact distally.  Examination of the left foot shows pes planus with standing. Diffuse tenderness to palpation along the lateral foot, especially over the peroneal tendons. Good strength. Neurovascularly intact distally.      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 I would like to get updated x-rays of her knees. I will place her on ibuprofen 600 mg twice daily as needed for pain. She is also given a prescription for tramadol 50 mg to take twice a day as needed for pain as well. Patient is currently without any sort of insurance so I recommended that she return to the office once she has  financial assistance (she thinks that this will be around October). At that time we can consider more aggressive workup and treatment if needed.

## 2016-01-02 ENCOUNTER — Telehealth: Payer: Self-pay | Admitting: Sports Medicine

## 2016-01-02 NOTE — Telephone Encounter (Signed)
I spoke with the patient on the phone today after reviewing x-rays of both knees. She has mild to moderate degenerative changes in both knees. Please see my office note from 01/01/2016 for details regarding history, physical exam findings, and treatment plan.

## 2016-02-20 ENCOUNTER — Ambulatory Visit (HOSPITAL_BASED_OUTPATIENT_CLINIC_OR_DEPARTMENT_OTHER): Payer: Self-pay | Attending: Family Medicine | Admitting: Internal Medicine

## 2016-02-20 DIAGNOSIS — G4733 Obstructive sleep apnea (adult) (pediatric): Secondary | ICD-10-CM | POA: Insufficient documentation

## 2016-02-20 DIAGNOSIS — R0683 Snoring: Secondary | ICD-10-CM | POA: Insufficient documentation

## 2016-02-20 DIAGNOSIS — Z6834 Body mass index (BMI) 34.0-34.9, adult: Secondary | ICD-10-CM | POA: Insufficient documentation

## 2016-02-20 DIAGNOSIS — E669 Obesity, unspecified: Secondary | ICD-10-CM | POA: Insufficient documentation

## 2016-02-20 DIAGNOSIS — I10 Essential (primary) hypertension: Secondary | ICD-10-CM | POA: Insufficient documentation

## 2016-02-20 DIAGNOSIS — R5383 Other fatigue: Secondary | ICD-10-CM | POA: Insufficient documentation

## 2016-02-20 DIAGNOSIS — I491 Atrial premature depolarization: Secondary | ICD-10-CM | POA: Insufficient documentation

## 2016-02-20 DIAGNOSIS — G4719 Other hypersomnia: Secondary | ICD-10-CM | POA: Insufficient documentation

## 2016-02-20 DIAGNOSIS — I493 Ventricular premature depolarization: Secondary | ICD-10-CM | POA: Insufficient documentation

## 2016-02-23 DIAGNOSIS — R0683 Snoring: Secondary | ICD-10-CM

## 2016-02-23 NOTE — Procedures (Signed)
Patient Name: Karen Ferguson, Karen Ferguson Date: 02/20/2016 Gender: Female D.O.B: 03/19/1956 Age (years): 69 Referring Provider: Leeanne Rio Height (inches): 64 Interpreting Physician: Baird Lyons MD, ABSM Weight (lbs): 200 RPSGT: Carolin Coy BMI: 34 MRN: [id number] Neck Size: 14.50 CLINICAL INFORMATION Sleep Study Type: Split Night CPAP Indication for sleep study: Excessive Daytime Sleepiness, Fatigue, Hypertension, Obesity, Snoring Epworth Sleepiness Score: 5  SLEEP STUDY TECHNIQUE As per the AASM Manual for the Scoring of Sleep and Associated Events v2.3 (April 2016) with a hypopnea requiring 4% desaturations. The channels recorded and monitored were frontal, central and occipital EEG, electrooculogram (EOG), submentalis EMG (chin), nasal and oral airflow, thoracic and abdominal wall motion, anterior tibialis EMG, snore microphone, electrocardiogram, and pulse oximetry. Continuous positive airway pressure (CPAP) was initiated when the patient met split night criteria and was titrated according to treat sleep-disordered breathing. MEDICATIONS Medications taken by the patient : charted for review Medications administered by patient during sleep study : No sleep medicine administered.  RESPIRATORY PARAMETERS Diagnostic Total AHI (/hr): 11.7 RDI (/hr): 21.3 OA Index (/hr): - CA Index (/hr): 0.0 REM AHI (/hr): 45.8 NREM AHI (/hr): 3.3 Supine AHI (/hr): N/A Non-supine AHI (/hr): 11.74 Min O2 Sat (%): 73.00 Mean O2 (%): 91.95 Time below 88% (min): 10.3   Titration Optimal Pressure (cm): 13 AHI at Optimal Pressure (/hr): 0.0 Min O2 at Optimal Pressure (%): 92.0 Supine % at Optimal (%): 0 Sleep % at Optimal (%): 26    SLEEP ARCHITECTURE The recording time for the entire night was 369.3 minutes. During a baseline period of 173.4 minutes, the patient slept for 138.0 minutes in REM and nonREM, yielding a sleep efficiency of 79.6%. Sleep onset after lights out was 29.4 minutes with a  REM latency of 102.0 minutes. The patient spent 12.68% of the night in stage N1 sleep, 67.39% in stage N2 sleep, 0.00% in stage N3 and 19.93% in REM. During the titration period of 191.6 minutes, the patient slept for 154.0 minutes in REM and nonREM, yielding a sleep efficiency of 80.4%. Sleep onset after CPAP initiation was 22.2 minutes with a REM latency of 97.5 minutes. The patient spent 13.64% of the night in stage N1 sleep, 56.82% in stage N2 sleep, 0.00% in stage N3 and 29.55% in REM.  CARDIAC DATA The 2 lead EKG demonstrated sinus rhythm. The mean heart rate was 65.40 beats per minute. Other EKG findings include: PVCs and PACs.  LEG MOVEMENT DATA The total Periodic Limb Movements of Sleep (PLMS) were 25. The PLMS index was 5.14 .  IMPRESSIONS - Mild obstructive sleep apnea occurred during the diagnostic portion of the study (AHI = 11.7 /hour). An optimal PAP pressure was selected for this patient ( 13 cm of water) - No significant central sleep apnea occurred during the diagnostic portion of the study (CAI = 0.0/hour). - Severe oxygen desaturation was noted during the diagnostic portion of the study (Min O2 = 73.00%). - The patient snored with Soft snoring volume during the diagnostic portion of the study. - EKG findings include PVCs and PACs. - Mild periodic limb movements of sleep occurred during the study.  DIAGNOSIS - Obstructive Sleep Apnea (327.23 [G47.33 ICD-10])  RECOMMENDATIONS - Trial of CPAP therapy on 13 cm H2O with a Small size Resmed Full Face Mask AirFit F20 mask and heated humidification. - Avoid alcohol, sedatives and other CNS depressants that may worsen sleep apnea and disrupt normal sleep architecture. - Sleep hygiene should be reviewed to assess factors that may improve sleep  quality. - Weight management and regular exercise should be initiated or continued.  [Electronically signed] 02/23/2016 10:25 AM  Baird Lyons MD, ABSM Diplomate, American Board of Sleep  Medicine   NPI: [id number]  [place], American Board of Sleep Medicine  ELECTRONICALLY SIGNED ON:  02/23/2016, 10:17 AM [place] PH: [phone]   FX: [phone] [address]

## 2016-03-20 ENCOUNTER — Telehealth: Payer: Self-pay | Admitting: *Deleted

## 2016-03-20 NOTE — Telephone Encounter (Signed)
-----   Message from Sela Hilding, MD sent at 03/11/2016  3:52 PM EDT ----- Please call patient to have her schedule a follow up with me to discuss sleep study. I tried to call on 10/17 pm without answer or VM.

## 2016-03-20 NOTE — Telephone Encounter (Signed)
Tried to contact pt to inform her of below and phone only rang with no VM, if pt calls back please have her schedule and appointment with Dr. Lindell Noe to discuss these results, per Dr. Lindell Noe message below. Katharina Caper, Devinn Voshell D, [place]

## 2016-03-24 NOTE — Telephone Encounter (Signed)
Pt has appointment scheduled to go over this on 04/01/16. Katharina Caper, April D, [place]

## 2016-04-01 ENCOUNTER — Encounter: Payer: Self-pay | Admitting: Family Medicine

## 2016-04-01 ENCOUNTER — Ambulatory Visit (HOSPITAL_COMMUNITY)
Admission: RE | Admit: 2016-04-01 | Discharge: 2016-04-01 | Disposition: A | Discharge status: Home / Self Care | Payer: Self-pay | Source: Ambulatory Visit | Attending: Family Medicine | Admitting: Family Medicine

## 2016-04-01 ENCOUNTER — Ambulatory Visit (INDEPENDENT_AMBULATORY_CARE_PROVIDER_SITE_OTHER): Payer: Self-pay | Admitting: Family Medicine

## 2016-04-01 VITALS — BP 132/74 | HR 74 | Temp 98.5°F | Ht 64.0 in | Wt 205.2 lb

## 2016-04-01 DIAGNOSIS — R001 Bradycardia, unspecified: Secondary | ICD-10-CM | POA: Insufficient documentation

## 2016-04-01 DIAGNOSIS — Z8249 Family history of ischemic heart disease and other diseases of the circulatory system: Secondary | ICD-10-CM

## 2016-04-01 DIAGNOSIS — I10 Essential (primary) hypertension: Secondary | ICD-10-CM

## 2016-04-01 DIAGNOSIS — G4733 Obstructive sleep apnea (adult) (pediatric): Secondary | ICD-10-CM

## 2016-04-01 DIAGNOSIS — R002 Palpitations: Secondary | ICD-10-CM

## 2016-04-01 NOTE — Patient Instructions (Signed)
I have placed a referral to cardiology for you.  Hopefully, they can help [place] determine if you are having irregular heart rhythms during your sleep.  Also, they may want to do a stress test for you to determine your risk for heart attack, given your strong family history.  Your EKG today was NORMAL and did not show any abnormal rhythms or evidence of heart attack.  I have sent your file to Casimer Lanius, our clinical social worker.  She will contact you with information if we can help you get your CPAP machine. Do not hesitate to call me if you have any needs or questions.   Palpitations A palpitation is the feeling that your heartbeat is irregular or is faster than normal. It may feel like your heart is fluttering or skipping a beat. Palpitations are usually not a serious problem. However, in some cases, you may need further medical evaluation. CAUSES  Palpitations can be caused by:  Smoking.  Caffeine or other stimulants, such as diet pills or energy drinks.  Alcohol.  Stress and anxiety.  Strenuous physical activity.  Fatigue.  Certain medicines.  Heart disease, especially if you have a history of irregular heart rhythms (arrhythmias), such as atrial fibrillation, atrial flutter, or supraventricular tachycardia.  An improperly working pacemaker or defibrillator. DIAGNOSIS  To find the cause of your palpitations, your health care provider will take your medical history and perform a physical exam. Your health care provider may also have you take a test called an ambulatory electrocardiogram (ECG). An ECG records your heartbeat patterns over a 24-hour period. You may also have other tests, such as:  Transthoracic echocardiogram (TTE). During echocardiography, sound waves are used to evaluate how blood flows through your heart.  Transesophageal echocardiogram (TEE).  Cardiac monitoring. This allows your health care provider to monitor your heart rate and rhythm in real time.  Holter  monitor. This is a portable device that records your heartbeat and can help diagnose heart arrhythmias. It allows your health care provider to track your heart activity for several days, if needed.  Stress tests by exercise or by giving medicine that makes the heart beat faster. TREATMENT  Treatment of palpitations depends on the cause of your symptoms and can vary greatly. Most cases of palpitations do not require any treatment other than time, relaxation, and monitoring your symptoms. Other causes, such as atrial fibrillation, atrial flutter, or supraventricular tachycardia, usually require further treatment. HOME CARE INSTRUCTIONS   Avoid:  Caffeinated coffee, tea, soft drinks, diet pills, and energy drinks.  Chocolate.  Alcohol.  Stop smoking if you smoke.  Reduce your stress and anxiety. Things that can help you relax include:  A method of controlling things in your body, such as your heartbeats, with your mind (biofeedback).  Yoga.  Meditation.  Physical activity such as swimming, jogging, or walking.  Get plenty of rest and sleep. SEEK MEDICAL CARE IF:   You continue to have a fast or irregular heartbeat beyond 24 hours.  Your palpitations occur more often. SEEK IMMEDIATE MEDICAL CARE IF:  You have chest pain or shortness of breath.  You have a severe headache.  You feel dizzy or you faint. MAKE SURE YOU:  Understand these instructions.  Will watch your condition.  Will get help right away if you are not doing well or get worse.   This information is not intended to replace advice given to you by your health care provider. Make sure you discuss any questions you have  with your health care provider.   Document Released: 05/09/2000 Document Revised: 05/17/2013 Document Reviewed: 07/11/2011 Elsevier Interactive Patient Education [employer].

## 2016-04-01 NOTE — Assessment & Plan Note (Signed)
Doing very well on the Norvasc prescribed at last visit.  She does report some heart palpitations.  EKG obtained during this appt and patient referred to Cardiology.  Continue norvasc daily as directed.

## 2016-04-01 NOTE — Assessment & Plan Note (Signed)
EKG normal.  No evidence of ischemia or arrhythmias.  Patient may warrant a holter monitor.  Will refer to cardiology for further evaluation.  Return precautions reviewed with patient.

## 2016-04-01 NOTE — Progress Notes (Signed)
Subjective: CC: follow up sleep study HPI: Karen Ferguson is a 60 y.o. female presenting to clinic today for follow up. Concerns today include:  1. OSA Patient had sleep study performed 01/2016.  She was recommended to start on a trial of CPAP at 13cm H20 w/ a small sized Resmed full face mask w/ heated humidification.  Patient reports that never obtained CPAP because she is uninsured.  She notes that she has the mask but does not have the actual machine.  She is worried that she cannot afford this.  She continues to snore, have decreased energy despite adequate hours of sleep.  She reports gasping for breath and frequent night time awakening.  2. Heart fluttering Patient reports that she has had a sensation of heart fluttering since last week.  She reports that symptoms occur mostly at night time, occ during the evening.  She feels short of breath when this happens.  No LE edema.  She reports a twinge in her chest 2 days ago that resolved.  She notes she was just sitting around when that happened.  She denies diaphoresis, visual disturbance, SOB, nausea or vomiting associated with chest discomfort.  She attributes sensation to gas and notes that it has happened several times in the past.  Never a smoker.  Occ beer but never >1 per day.  Never a drug user.  She reports a strong family history of MI, grandfather 63 years old and passed from MI.  3. Hypertension Blood pressure today: 132/74 Meds: Compliant with Norvasc Side effects: none ROS: SOB and chest discomfort as above. Denies headache, dizziness, visual changes, nausea, vomiting, abdominal pain  Social History Reviewed: never smoker. FamHx and MedHx reviewed.   Family History  Problem Relation Age of Onset  . Arthritis Mother   . Hypertension Mother   . Diabetes Mother   . Kidney disease Mother   . Arthritis Sister   . Hypertension Brother   . Heart disease Maternal Aunt   . Heart disease Maternal Uncle   . Cancer Maternal  Uncle   . Heart disease Maternal Grandfather   . Heart attack Maternal Grandfather    ROS: Per HPI  Objective: Office vital signs reviewed. BP 132/74   Pulse 74   Temp 98.5 F (36.9 C) (Oral)   Ht 5\' 4"  (1.626 m)   Wt 205 lb 3.2 oz (93.1 kg)   SpO2 96%   BMI 35.22 kg/m   Physical Examination:  General: Awake, alert, overweight, No acute distress HEENT: no JVD, no conjunctival pallor Cardio: regular rate and rhythm, S1S2 heard, no murmurs appreciated Pulm: clear to auscultation bilaterally, no wheezes, rhonchi or rales, normal WOB on room air Extremities: warm, well perfused, No edema, cyanosis or clubbing; +2 pulses bilaterally  Assessment/ Plan: 60 y.o. female   Essential hypertension Doing very well on the Norvasc prescribed at last visit.  She does report some heart palpitations.  EKG obtained during this appt and patient referred to Cardiology.  Continue norvasc daily as directed.  Obstructive sleep apnea Diagnosed 02/20/2016.  Patient unfortunately unable to afford CPAP machine, as she is uninsured.  I think that she would benefit greatly from CPAP, especially given her strong family history of MI at an early age.  Will cc: CSW Casimer Lanius to see if we can get patient some assistance with affording this.  Once we figure payment out, will plan to rx Trial of CPAP therapy on 13 cm H2O with a Small size Resmed  Full Face Mask AirFit F20 mask and heated humidification.  Palpitations EKG normal.  No evidence of ischemia or arrhythmias.  Patient may warrant a holter monitor.  Will refer to cardiology for further evaluation.  Return precautions reviewed with patient.  Family history of premature coronary heart disease Ambulatory referral to Cardiology for consideration of stress testing.  Follow up in 3 months for annual exam or sooner if needed.  Janora Norlander, DO PGY-3, [facility]

## 2016-04-01 NOTE — Assessment & Plan Note (Addendum)
Diagnosed 02/20/2016.  Patient unfortunately unable to afford CPAP machine, as she is uninsured.  I think that she would benefit greatly from CPAP, especially given her strong family history of MI at an early age.  Will cc: CSW Casimer Lanius to see if we can get patient some assistance with affording this.  Once we figure payment out, will plan to rx Trial of CPAP therapy on 13 cm H2O with a Small size Resmed Full Face Mask AirFit F20 mask and heated humidification.

## 2016-04-02 ENCOUNTER — Telehealth: Payer: Self-pay | Admitting: Licensed Clinical Social Worker

## 2016-04-02 NOTE — Progress Notes (Signed)
Social work consult from Dr. Lajuana Ripple to assist patient with resources to obtain a CPAP machine. Call to patient to discuss the above consult.  Patient is newly diagnosed with sleep apnea.  Informed LCSW she has not looked into any options to obtain the machine.  LCSW discussed two possible options with patient. Patient requested that LCSW send the information to her via e-mail so that she could review it later.  Call to [facility] 800) [phone] , was informed when MD sends patient's referral to [facility] to indicate that patient wants to set up pay arrangements and they will review it for a possible payment plan.  PCP notified Plan:  1. LCSW informed patient via e-mail that [facility] will consider pay arrangements when MD sends in the referral. 2. Patient will also go to website www.sleepapnea.org and read information on the CPAP Assistance program.  She understand the cost is $100.00 for the machine, however it does not include a humidifier.   3. LCSW will follow up with Patient in 7 to 10 days.  Casimer Lanius, LCSW Licensed Clinical Social Worker [place]   [phone] 10:33 AM

## 2016-04-11 ENCOUNTER — Telehealth: Payer: Self-pay | Admitting: Licensed Clinical Social Worker

## 2016-04-11 ENCOUNTER — Encounter: Payer: Self-pay | Admitting: Physician Assistant

## 2016-04-11 NOTE — Progress Notes (Signed)
Follow up call to patient reference resources to obtain CPAP machine.   She confirmed receipt of information LCSW sent about various CPAP options.  Patient has not made a decision at this time. LCSW reviewed options with patient again.    Plan: Patient will call Dr. Elpidio Anis if she decides to obtain her equipment from [facility] with pay arrangements or patient will call LCSW for assistance if she has questions.   Casimer Lanius, LCSW Licensed Clinical Social Worker [facility]   [phone] 2:34 PM

## 2016-04-23 ENCOUNTER — Ambulatory Visit: Payer: Self-pay | Admitting: Physician Assistant

## 2016-04-28 ENCOUNTER — Ambulatory Visit: Payer: Self-pay | Admitting: Physician Assistant

## 2016-05-02 ENCOUNTER — Ambulatory Visit (INDEPENDENT_AMBULATORY_CARE_PROVIDER_SITE_OTHER): Payer: Self-pay | Admitting: Physician Assistant

## 2016-05-02 ENCOUNTER — Encounter (INDEPENDENT_AMBULATORY_CARE_PROVIDER_SITE_OTHER): Payer: Self-pay

## 2016-05-02 ENCOUNTER — Encounter: Payer: Self-pay | Admitting: Physician Assistant

## 2016-05-02 VITALS — BP 142/86 | HR 66 | Ht 65.0 in | Wt 207.0 lb

## 2016-05-02 DIAGNOSIS — R0989 Other specified symptoms and signs involving the circulatory and respiratory systems: Secondary | ICD-10-CM

## 2016-05-02 DIAGNOSIS — R0602 Shortness of breath: Secondary | ICD-10-CM

## 2016-05-02 DIAGNOSIS — G4733 Obstructive sleep apnea (adult) (pediatric): Secondary | ICD-10-CM

## 2016-05-02 DIAGNOSIS — I10 Essential (primary) hypertension: Secondary | ICD-10-CM

## 2016-05-02 DIAGNOSIS — R002 Palpitations: Secondary | ICD-10-CM

## 2016-05-02 NOTE — Patient Instructions (Addendum)
Medication Instructions:  Your physician recommends that you continue on your current medications as directed. Please refer to the Current Medication list given to you today.  Labwork: NONE  Testing/Procedures: 1. Your physician has recommended that you wear a 48 HOUR holter monitor. Holter monitors are medical devices that record the heart's electrical activity. Doctors most often use these monitors to diagnose arrhythmias. Arrhythmias are problems with the speed or rhythm of the heartbeat. The monitor is a small, portable device. You can wear one while you do your normal daily activities. This is usually used to diagnose what is causing palpitations/syncope (passing out).  2. Your physician has requested that you have an echocardiogram. Echocardiography is a painless test that uses sound waves to create images of your heart. It provides your doctor with information about the size and shape of your heart and how well your heart's chambers and valves are working. This procedure takes approximately one hour. There are no restrictions for this procedure.  2. Your physician has requested that you have a carotid duplex. This test is an ultrasound of the carotid arteries in your neck. It looks at blood flow through these arteries that supply the brain with blood. Allow one hour for this exam. There are no restrictions or special instructions.  Follow-Up: [employer], [facility] IN 1 MONTH  Any Other Special Instructions Will Be Listed Below (If Applicable).  If you need a refill on your cardiac medications before your next appointment, please call your pharmacy.

## 2016-05-02 NOTE — Progress Notes (Signed)
Cardiology Office Note:    Date:  05/02/2016   ID:  Karen Ferguson, DOB [birth year], MRN [id number]  PCP:  Ronnie Doss, DO  Cardiologist:  New - seen by Richardson Dopp, PA-C/Dr. Daneen Schick today  Electrophysiologist:  n/a  Referring MD: Janora Norlander, DO   Chief Complaint  Patient presents with  . New Patient (Initial Visit)    palpitations; dyspnea     History of Present Illness:    Karen Ferguson is a 60 y.o. female with a hx of HTN, OSA.  She is referred by her primary care physician to evaluate palpitations. Patient notes heart palpitations for many years. She can hear her heartbeat in her left ear especially when she lays down in bed at night. She describes her palpitations as a "rush" in her chest. It may happen at any time. Activity does not bring it on. She notes it at night at times. It only lasts seconds. She denies any rapid or sustained palpitations. She denies chest discomfort. She notes significant fatigue. She notes dyspnea but mainly describes being tired. She sleeps on 3 pillows. She has done this chronically. She denies PND or edema. She denies syncope.  Prior CV studies that were reviewed today include:    None   Past Medical History:  Diagnosis Date  . Arthritis   . Asthma    Albuterol prn  . Bronchitis   . Degenerative disc disease   . Depression   . Hypertension     Past Surgical History:  Procedure Laterality Date  . ABDOMINAL HYSTERECTOMY  1988   for fibroid tumors  . [place]   rt  . CHOLECYSTECTOMY  1989  . [place]  . TENDON REPAIR Right 03/03/2013   Procedure: RIGHT DEBRIDEMENT AND TENOLYSIS OF PERONEOUS LONGOUS AND BREVIS TENDONS ;  Surgeon: Wylene Simmer, MD;  Location: [facility];  Service: Orthopedics;  Laterality: Right;    Current Medications: Current Meds  Medication Sig  . amLODipine (NORVASC) 5 MG tablet Take 1 tablet (5 mg total) by mouth daily.  [place] Kitchen ibuprofen (ADVIL,MOTRIN) 600  MG tablet Take 1 tablet (600 mg total) by mouth 2 (two) times daily as needed.     Allergies:   Shellfish allergy   Social History   Social History  . Marital status: Single    Spouse name: N/A  . Number of children: N/A  . Years of education: N/A   Social History Main Topics  . Smoking status: Never Smoker  . Smokeless tobacco: Never Used  . Alcohol use Yes     Comment: occasional  . Drug use: No  . Sexual activity: No   Other Topics Concern  . None   Social History Narrative   Works at [employer] ([employer]) in [employer].     Single   1 daughter - passed away at age 96   Lives with her 2 grandchildren.     Family History:  The patient's family history includes Arrhythmia in her mother; Arthritis in her mother and sister; Cancer in her maternal uncle; Diabetes in her mother; Heart attack (age of onset: 68) in her maternal grandfather; Heart disease in her maternal aunt, maternal grandfather, maternal uncle, and sister; Heart failure in her maternal aunt; Hypertension in her brother and mother; Kidney disease in her mother.   ROS:   Please see the history of present illness.    Review of Systems  Eyes: Positive for  visual disturbance.  Cardiovascular: Positive for dyspnea on exertion.  Respiratory: Positive for shortness of breath and snoring.   Musculoskeletal: Positive for back pain, joint pain, joint swelling and myalgias.  Gastrointestinal: Positive for abdominal pain, constipation and hematochezia.  Neurological: Positive for loss of balance.  Psychiatric/Behavioral: Positive for depression. The patient is nervous/anxious.    All other systems reviewed and are negative.   EKGs/Labs/Other Test Reviewed:    EKG:  EKG is  ordered today.  The ekg ordered today demonstrates NSR, HR 66, normal axis, borderline LVH, QTc 427 ms  Recent Labs: 06/29/2015: BUN 17; Creatinine, Ser 0.90; Hemoglobin 18.0; Potassium 4.4; Sodium 140   Recent Lipid Panel    Component  Value Date/Time   CHOL 164 01/10/2015 1009   TRIG 101 01/10/2015 1009   HDL 47 01/10/2015 1009   CHOLHDL 3.5 01/10/2015 1009   VLDL 20 01/10/2015 1009   LDLCALC 97 01/10/2015 1009     Physical Exam:    VS:  BP (!) 142/86   Pulse 66   Ht 5\' 5"  (1.651 m)   Wt 207 lb (93.9 kg)   BMI 34.45 kg/m     Wt Readings from Last 3 Encounters:  05/02/16 207 lb (93.9 kg)  04/01/16 205 lb 3.2 oz (93.1 kg)  02/20/16 200 lb (90.7 kg)     Physical Exam  Constitutional: She is oriented to person, place, and time. She appears well-developed and well-nourished. No distress.  HENT:  Head: Normocephalic and atraumatic.  Eyes: No scleral icterus.  Neck: No JVD present.  Cardiovascular: Normal rate, regular rhythm and normal heart sounds.   No murmur heard. Pulses:      Carotid pulses are on the left side with bruit. Pulmonary/Chest: Effort normal. She has no wheezes. She has no rales.  Abdominal: Soft. There is no tenderness.  Musculoskeletal: She exhibits no edema.  Neurological: She is alert and oriented to person, place, and time.  Skin: Skin is warm and dry.  Psychiatric: She has a normal mood and affect.    ASSESSMENT:    1. Palpitations   2. Shortness of breath   3. Bruit of left carotid artery   4. Essential hypertension   5. OSA (obstructive sleep apnea)    PLAN:    In order of problems listed above:  1. Palpitations - Etiology not clear.  She may be describing PVCs.  She has not had any symptoms to suggest sustained arrhythmias.  I will arrange a 48 Hr Holter monitor.  2. Shortness of breath - Her dyspnea is a long standing problem.  She has HTN and is obese.  She is at risk for CHF.  Her exam is not suggestive of volume overload.  I will arrange an echocardiogram.  If she has diastolic dysfunction, she may benefit from the addition of a low dose thiazide diuretic.  3. Bruit - Questionable L carotid bruit on exam.  She has a hx of hearing her heartbeat in her L ear.  I will  obtain a Carotid ultrasound.  4. HTN - BP elevated.  It has previously been well controlled.  She is due to start CPAP soon which will likely help.  5. OSA - As noted, she will start CPAP soon.   Her fatigue is all likely related to her untreated OSA.   Medication Adjustments/Labs and Tests Ordered: Current medicines are reviewed at length with the patient today.  Concerns regarding medicines are outlined above.  Medication changes, Labs and Tests ordered  today are outlined in the Patient Instructions noted below. Patient Instructions  Medication Instructions:  Your physician recommends that you continue on your current medications as directed. Please refer to the Current Medication list given to you today.  Labwork: NONE  Testing/Procedures: 1. Your physician has recommended that you wear a 48 HOUR holter monitor. Holter monitors are medical devices that record the heart's electrical activity. Doctors most often use these monitors to diagnose arrhythmias. Arrhythmias are problems with the speed or rhythm of the heartbeat. The monitor is a small, portable device. You can wear one while you do your normal daily activities. This is usually used to diagnose what is causing palpitations/syncope (passing out).  2. Your physician has requested that you have an echocardiogram. Echocardiography is a painless test that uses sound waves to create images of your heart. It provides your doctor with information about the size and shape of your heart and how well your heart's chambers and valves are working. This procedure takes approximately one hour. There are no restrictions for this procedure.  2. Your physician has requested that you have a carotid duplex. This test is an ultrasound of the carotid arteries in your neck. It looks at blood flow through these arteries that supply the brain with blood. Allow one hour for this exam. There are no restrictions or special instructions.  Follow-Up: [facility], [facility] IN 1 MONTH  Any Other Special Instructions Will Be Listed Below (If Applicable).  If you need a refill on your cardiac medications before your next appointment, please call your pharmacy.  Signed, Richardson Dopp, PA-C  05/02/2016 12:05 PM    [facility] [address], [place], [place]  [address] Phone: [phone]; Fax: [phone]   The patient has been seen in conjunction with Richardson Dopp, PA-C. All aspects of care have been considered and discussed. The patient has been personally interviewed, examined, and all clinical data has been reviewed.   Palpitations and shortness of breath raise concern for the possibility of cardiomyopathy other structural heart problem. Plan to do Doppler echocardiogram, Holter monitor, and carotid Doppler study.

## 2016-05-12 ENCOUNTER — Ambulatory Visit (INDEPENDENT_AMBULATORY_CARE_PROVIDER_SITE_OTHER): Payer: Self-pay

## 2016-05-12 DIAGNOSIS — R002 Palpitations: Secondary | ICD-10-CM

## 2016-05-23 ENCOUNTER — Inpatient Hospital Stay (HOSPITAL_COMMUNITY): Admission: RE | Admit: 2016-05-23 | Payer: Self-pay | Source: Ambulatory Visit

## 2016-05-23 ENCOUNTER — Ambulatory Visit (HOSPITAL_COMMUNITY): Payer: Self-pay

## 2016-05-28 ENCOUNTER — Telehealth: Payer: Self-pay | Admitting: Physician Assistant

## 2016-05-28 MED FILL — AMLODIPINE BESYLATE 5 MG TA: 5 | 90 days supply | Qty: 90 | Fill #1

## 2016-05-28 NOTE — Telephone Encounter (Signed)
Patient has follow up 06/03/16. The echocardiogram has not been done yet. Please make sure echocardiogram done prior to follow up appointment. Richardson Dopp, PA-C   05/28/2016 9:55 AM

## 2016-05-29 ENCOUNTER — Ambulatory Visit (HOSPITAL_COMMUNITY): Payer: BLUE CROSS/BLUE SHIELD | Attending: Cardiovascular Disease

## 2016-05-29 ENCOUNTER — Other Ambulatory Visit: Payer: Self-pay

## 2016-05-29 DIAGNOSIS — I10 Essential (primary) hypertension: Secondary | ICD-10-CM | POA: Diagnosis not present

## 2016-05-29 DIAGNOSIS — Z8249 Family history of ischemic heart disease and other diseases of the circulatory system: Secondary | ICD-10-CM | POA: Diagnosis not present

## 2016-05-29 DIAGNOSIS — R0602 Shortness of breath: Secondary | ICD-10-CM | POA: Diagnosis not present

## 2016-05-29 DIAGNOSIS — G4733 Obstructive sleep apnea (adult) (pediatric): Secondary | ICD-10-CM | POA: Insufficient documentation

## 2016-05-30 ENCOUNTER — Encounter: Payer: Self-pay | Admitting: Physician Assistant

## 2016-05-30 ENCOUNTER — Telehealth: Payer: Self-pay | Admitting: *Deleted

## 2016-05-30 NOTE — Telephone Encounter (Signed)
Pt notified of echo results and findings by phone with verbal understanding. 

## 2016-06-03 ENCOUNTER — Encounter: Payer: Self-pay | Admitting: Physician Assistant

## 2016-06-03 ENCOUNTER — Ambulatory Visit: Payer: Self-pay | Admitting: Physician Assistant

## 2016-06-03 NOTE — Progress Notes (Deleted)
Cardiology Office Note:    Date:  06/03/2016   ID:  Karen Ferguson, DOB 05-04-1956, MRN [id number]  PCP:  Ronnie Doss, DO  Cardiologist:  Richardson Dopp, PA-C/Dr. Daneen Schick   Electrophysiologist:  n/a  Referring MD: Janora Norlander, DO   No chief complaint on file. ***  History of Present Illness:    Karen Ferguson is a 61 y.o. female with a hx of HTN, OSA.  She was seen by me and Dr. Daneen Schick 05/02/16 to evaluate palpitations.  We arranged a Holter Monitor, echocardiogram and Carotid ultrasound. She never did have the Carotid ultrasound done.  Her Holter monitor demonstrated PACs/PVCs.  There was one 4 beat run of NSVT.  There were no significant arrhythmias.  The echocardiogram demonstrated normal LVF and mild diastolic dysfunction.  She returns for follow up.  ***  Prior CV studies that were reviewed today include:    Echo 05/29/16 EF 60-65, no RWMA, Gr 1 DD, PASP 20  Holter 04/2016  Normal sinus rhythm  Occasional PAC's and PVC's  No atrial fib or pauses  Past Medical History:  Diagnosis Date  . Arthritis   . Asthma    Albuterol prn  . Bronchitis   . Degenerative disc disease   . Depression   . History of echocardiogram    Echo 1/18: EF 60-65, no RWMA, Gr 1 DD, PASP 20  . Hypertension     Past Surgical History:  Procedure Laterality Date  . ABDOMINAL HYSTERECTOMY  1988   for fibroid tumors  . [place]   rt  . CHOLECYSTECTOMY  1989  . [place]  . TENDON REPAIR Right 03/03/2013   Procedure: RIGHT DEBRIDEMENT AND TENOLYSIS OF PERONEOUS LONGOUS AND BREVIS TENDONS ;  Surgeon: Wylene Simmer, MD;  Location: [facility];  Service: Orthopedics;  Laterality: Right;    Current Medications: No outpatient prescriptions have been marked as taking for the 06/03/16 encounter (Appointment) with Liliane Shi, PA-C.     Allergies:   Shellfish allergy   Social History   Social History  . Marital status: Single   Spouse name: N/A  . Number of children: N/A  . Years of education: N/A   Social History Main Topics  . Smoking status: Never Smoker  . Smokeless tobacco: Never Used  . Alcohol use Yes     Comment: occasional  . Drug use: No  . Sexual activity: No   Other Topics Concern  . Not on file   Social History Narrative   Works at [employer] ([employer]) in [employer].     Single   1 daughter - passed away at age 34   Lives with her 2 grandchildren.     Family History:  The patient's ***family history includes Arrhythmia in her mother; Arthritis in her mother and sister; Cancer in her maternal uncle; Diabetes in her mother; Heart attack (age of onset: 56) in her maternal grandfather; Heart disease in her maternal aunt, maternal grandfather, maternal uncle, and sister; Heart failure in her maternal aunt; Hypertension in her brother and mother; Kidney disease in her mother.   ROS:   Please see the history of present illness.    ROS All other systems reviewed and are negative.   EKGs/Labs/Other Test Reviewed:    EKG:  EKG is *** ordered today.  The ekg ordered today demonstrates ***  Recent Labs: 06/29/2015: BUN 17; Creatinine, Ser 0.90; Hemoglobin 18.0; Potassium 4.4;  Sodium 140   Recent Lipid Panel    Component Value Date/Time   CHOL 164 01/10/2015 1009   TRIG 101 01/10/2015 1009   HDL 47 01/10/2015 1009   CHOLHDL 3.5 01/10/2015 1009   VLDL 20 01/10/2015 1009   LDLCALC 97 01/10/2015 1009     Physical Exam:    VS:  There were no vitals taken for this visit.    Wt Readings from Last 3 Encounters:  05/02/16 207 lb (93.9 kg)  04/01/16 205 lb 3.2 oz (93.1 kg)  02/20/16 200 lb (90.7 kg)     ***Physical Exam  ASSESSMENT:    No diagnosis found. PLAN:    In order of problems listed above:  1. Palpitations - Recent Holter with no significant arrhythmias.  She did have PVCs (0.1%) and one 4 beat run of NSVT.  Echo demonstrated normal LVF.  *** 2. Shortness of breath -  *** 3. HTN - *** 4. OSA - *** 5. Bruit - ***  Medication Adjustments/Labs and Tests Ordered: Current medicines are reviewed at length with the patient today.  Concerns regarding medicines are outlined above.  Medication changes, Labs and Tests ordered today are outlined in the Patient Instructions noted below. There are no Patient Instructions on file for this visit. Signed, Richardson Dopp, PA-C  06/03/2016 11:31 AM    [facility] [place], [place], [place]  [address] Phone: [phone]; Fax: [phone]

## 2016-06-23 ENCOUNTER — Telehealth: Payer: Self-pay | Admitting: Interventional Cardiology

## 2016-06-23 NOTE — Telephone Encounter (Signed)
Returned call to patient. Informed her unable to locate in EPIC where someone was trying to reach her. She states she has received results from all her recent testing. Apologized for inconvenience.

## 2016-06-23 NOTE — Telephone Encounter (Signed)
New Message  Pt voiced someone call her but there are no notes or encounters for this pts conversation.  Please f/u with pt

## 2016-08-08 ENCOUNTER — Ambulatory Visit (INDEPENDENT_AMBULATORY_CARE_PROVIDER_SITE_OTHER): Payer: BLUE CROSS/BLUE SHIELD | Admitting: Family Medicine

## 2016-08-08 ENCOUNTER — Encounter: Payer: Self-pay | Admitting: Family Medicine

## 2016-08-08 VITALS — BP 118/62 | HR 79 | Temp 99.1°F | Ht 65.0 in | Wt 213.8 lb

## 2016-08-08 DIAGNOSIS — J069 Acute upper respiratory infection, unspecified: Secondary | ICD-10-CM | POA: Diagnosis not present

## 2016-08-08 DIAGNOSIS — B9789 Other viral agents as the cause of diseases classified elsewhere: Secondary | ICD-10-CM | POA: Diagnosis not present

## 2016-08-08 DIAGNOSIS — G4733 Obstructive sleep apnea (adult) (pediatric): Secondary | ICD-10-CM | POA: Diagnosis not present

## 2016-08-08 MED ORDER — ALBUTEROL SULFATE HFA 108 (90 BASE) MCG/ACT IN AERS: 2.0000 | INHALATION_SPRAY | Freq: Four times a day (QID) | RESPIRATORY_TRACT | 0 refills | Status: DC | PRN

## 2016-08-08 MED ORDER — BENZONATATE 200 MG PO CAPS: 200.0000 mg | ORAL_CAPSULE | Freq: Two times a day (BID) | ORAL | 0 refills | Status: DC | PRN

## 2016-08-08 MED FILL — VENTOLIN HFA 90 MCG INHALER: 108 (90 BAS | 25 days supply | Qty: 18 | Fill #0

## 2016-08-08 MED FILL — BENZONATATE 200 MG CAPSULE: 200 | 10 days supply | Qty: 20 | Fill #0

## 2016-08-08 NOTE — Patient Instructions (Signed)
It appears that you have a viral upper respiratory infection (cold).  Cold symptoms can last up to 2 weeks.    - Get plenty of rest and drink plenty of fluids. - Try to breathe moist air. Use a humidifier or take a steamy shower. - Consume warm fluids (soup or tea) to provide relief for a stuffy nose and to loosen phlegm. - For nasal stuffiness, try saline nasal spray or a Neti Pot. - For sore throat pain relief: suck on throat lozenges, hard candy or popsicles; gargle with warm salt water (1/4 tsp. salt per 8 oz. of water); and eat soft, bland foods. - Eat a well-balanced diet. If you cannot, ensure you are getting enough nutrients by taking a daily multivitamin. - Avoid dairy products, as they can thicken phlegm. - Avoid alcohol, as it impairs your body's immune system.  CONTACT YOUR DOCTOR IF YOU EXPERIENCE ANY OF THE FOLLOWING: - High fever - Ear pain - Sinus-type headache - Unusually severe cold symptoms - Cough that gets worse while other cold symptoms improve - Flare up of any chronic lung problem, such as asthma - Your symptoms persist longer than 2 weeks    

## 2016-08-08 NOTE — Assessment & Plan Note (Signed)
Patient now has insurance.  Diagnosed 02/20/2016.  Hopefully, will not require new sleep study.  Will reorder advanced home care order for CPAP set up with recommendation for CPAP therapy on 13 cm H2O with a Small size Resmed Full Face Mask AirFit F20 mask and heated humidification as recommended by Sleep Specialist.

## 2016-08-08 NOTE — Progress Notes (Signed)
   Subjective: CC: cough SMO:LMBEM J Lengyel is a 61 y.o. female presenting to clinic today for same day appointment. PCP: Ronnie Doss, DO Concerns today include:  1. Cough Patient reports that on Tuesday night she woke up with diarrhea, cramping, cough, body aches.  She reports feeling warm but no measured fevers.  No vomiting.  She reports chest congestion, nasal congestion.  She reports mild headache.  Denies sore throat.  She reports decreased appetite.  Drinking normally.  She reports feeling like she can't take a good breath in.  She denies CP.  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  2. OSA Patient reports she now has insurance and can afford CPAP machine.  She continues to snore and gasp at night and have decreased daytime energy/ poor sleep.  She would like new order for CPAP.  Allergies  Allergen Reactions  . Shellfish Allergy Hives    Social Hx reviewed. MedHx, current medications and allergies reviewed.  Please see EMR. ROS: Per HPI  Objective: Office vital signs reviewed. BP 118/62   Pulse 79   Temp 99.1 F (37.3 C) (Oral)   Ht 5\' 5"  (1.651 m)   Wt 213 lb 12.8 oz (97 kg)   SpO2 95%   BMI 35.58 kg/m   Physical Examination:  General: Awake, alert, well nourished, No acute distress HEENT: Normal    Neck: No masses palpated. No lymphadenopathy    Ears: Tympanic membranes intact, normal light reflex, no erythema, no bulging    Eyes: PERRLA, EOMI, sclera white    Nose: nasal turbinates moist, scant clear nasal discharge    Throat: moist mucus membranes, no erythema, no tonsillar exudate.  Airway is patent Cardio: regular rate and rhythm, S1S2 heard, no murmurs appreciated Pulm: clear to auscultation bilaterally, no wheezes, rhonchi or rales; normal work of breathing on room air  Assessment/ Plan: 61 y.o. female   1. Viral URI with cough.  No abnormal lung findings but patient notes improvement in breathing with albuterol in past w/ URI so this has been  rx'd. No evidence of bacterial infection on exam.  Has a flu exposure but afebrile here and outside of window for treatment. - benzonatate (TESSALON) 200 MG capsule; Take 1 capsule (200 mg total) by mouth 2 (two) times daily as needed for cough.  Dispense: 20 capsule; Refill: 0 - albuterol (PROVENTIL HFA;VENTOLIN HFA) 108 (90 Base) MCG/ACT inhaler; Inhale 2 puffs into the lungs every 6 (six) hours as needed for wheezing or shortness of breath.  Dispense: 1 Inhaler; Refill: 0 - Home care instructions reviewed. - Return precautions reviewed  Obstructive sleep apnea Patient now has insurance.  Diagnosed 02/20/2016.  Hopefully, will not require new sleep study.  Will reorder advanced home care order for CPAP set up with recommendation for CPAP therapy on 13 cm H2O with a Small size Resmed Full Face Mask AirFit F20 mask and heated humidification as recommended by Sleep Specialist.   Janora Norlander, DO PGY-3, [facility]

## 2016-08-14 ENCOUNTER — Encounter (HOSPITAL_COMMUNITY): Payer: Self-pay | Admitting: Physician Assistant

## 2016-10-17 ENCOUNTER — Encounter: Payer: Self-pay | Admitting: Gastroenterology

## 2016-10-22 DIAGNOSIS — M130 Polyarthritis, unspecified: Secondary | ICD-10-CM | POA: Diagnosis not present

## 2016-10-22 DIAGNOSIS — G4730 Sleep apnea, unspecified: Secondary | ICD-10-CM | POA: Diagnosis not present

## 2016-10-22 DIAGNOSIS — I10 Essential (primary) hypertension: Secondary | ICD-10-CM | POA: Diagnosis not present

## 2016-10-22 DIAGNOSIS — C4720 Malignant neoplasm of peripheral nerves of unspecified lower limb, including hip: Secondary | ICD-10-CM | POA: Diagnosis not present

## 2016-10-22 MED FILL — VALSARTAN-HCTZ 80-12.5 MG T: 80-12.5 | 30 days supply | Qty: 30 | Fill #0

## 2016-10-30 DIAGNOSIS — Z1231 Encounter for screening mammogram for malignant neoplasm of breast: Secondary | ICD-10-CM | POA: Diagnosis not present

## 2016-10-30 DIAGNOSIS — Z780 Asymptomatic menopausal state: Secondary | ICD-10-CM | POA: Diagnosis not present

## 2016-11-05 DIAGNOSIS — G4730 Sleep apnea, unspecified: Secondary | ICD-10-CM | POA: Diagnosis not present

## 2016-11-05 DIAGNOSIS — M130 Polyarthritis, unspecified: Secondary | ICD-10-CM | POA: Diagnosis not present

## 2016-11-05 DIAGNOSIS — I10 Essential (primary) hypertension: Secondary | ICD-10-CM | POA: Diagnosis not present

## 2016-11-13 DIAGNOSIS — N6310 Unspecified lump in the right breast, unspecified quadrant: Secondary | ICD-10-CM | POA: Diagnosis not present

## 2016-11-13 DIAGNOSIS — N6489 Other specified disorders of breast: Secondary | ICD-10-CM | POA: Diagnosis not present

## 2016-11-13 DIAGNOSIS — S83241A Other tear of medial meniscus, current injury, right knee, initial encounter: Secondary | ICD-10-CM | POA: Diagnosis not present

## 2016-11-13 DIAGNOSIS — R922 Inconclusive mammogram: Secondary | ICD-10-CM | POA: Diagnosis not present

## 2016-11-13 DIAGNOSIS — S83242A Other tear of medial meniscus, current injury, left knee, initial encounter: Secondary | ICD-10-CM | POA: Diagnosis not present

## 2016-11-17 MED FILL — VALSARTAN-HCTZ 80-12.5 MG T: 80-12.5 | 30 days supply | Qty: 30 | Fill #1

## 2016-11-19 ENCOUNTER — Other Ambulatory Visit: Payer: Self-pay | Admitting: Radiology

## 2016-11-19 DIAGNOSIS — N6011 Diffuse cystic mastopathy of right breast: Secondary | ICD-10-CM | POA: Diagnosis not present

## 2016-11-27 DIAGNOSIS — M150 Primary generalized (osteo)arthritis: Secondary | ICD-10-CM | POA: Diagnosis not present

## 2016-11-27 DIAGNOSIS — I10 Essential (primary) hypertension: Secondary | ICD-10-CM | POA: Diagnosis not present

## 2016-11-28 MED FILL — rOPINIRole HCL 0.5 MG TABS: 0.5 | 30 days supply | Qty: 120 | Fill #0

## 2016-12-05 ENCOUNTER — Encounter (HOSPITAL_COMMUNITY): Payer: Self-pay | Admitting: *Deleted

## 2016-12-05 ENCOUNTER — Ambulatory Visit (HOSPITAL_COMMUNITY)
Admission: EM | Admit: 2016-12-05 | Discharge: 2016-12-05 | Disposition: A | Discharge status: Home / Self Care | Payer: BLUE CROSS/BLUE SHIELD | Attending: Internal Medicine | Admitting: Internal Medicine

## 2016-12-05 DIAGNOSIS — M5412 Radiculopathy, cervical region: Secondary | ICD-10-CM | POA: Diagnosis not present

## 2016-12-05 MED ORDER — PREDNISONE 10 MG (21) PO TBPK: ORAL_TABLET | ORAL | 0 refills | Status: DC

## 2016-12-05 MED FILL — predniSONE 10 MG TABS: 10 | 6 days supply | Qty: 21 | Fill #0

## 2016-12-05 NOTE — Discharge Instructions (Signed)
Rest, ice when possible, avoid repetitive motion, started on a prednisone taper, take as directed, follow-up with orthopedics if pain persists

## 2016-12-05 NOTE — ED Provider Notes (Signed)
CSN: [id number]     Arrival date & time 12/05/16  1152 History   First MD Initiated Contact with Patient 12/05/16 1335     Chief Complaint  Patient presents with  . Arm Pain   (Consider location/radiation/quality/duration/timing/severity/associated sxs/prior Treatment) 61 year old female presents with a one-week history of left shoulder pain with tingling and numbness extending down the left shoulder, and tenderness along the medial epicondyle. Patient works in Chartered certified accountant at [employer], and has a significant amount of repetitive motion. Denies any fever or chills, or any markers of systemic illness.   The history is provided by the patient.    Past Medical History:  Diagnosis Date  . Arthritis   . Asthma    Albuterol prn  . Bronchitis   . Degenerative disc disease   . Depression   . History of echocardiogram    Echo 1/18: EF 60-65, no RWMA, Gr 1 DD, PASP 20  . Hypertension   . PVC's (premature ventricular contractions)    Holter 12/17: NSR, occ PAC/PVCs, no AFib; one 4 beat run NSVT   Past Surgical History:  Procedure Laterality Date  . ABDOMINAL HYSTERECTOMY  1988   for fibroid tumors  . [place]   rt  . CHOLECYSTECTOMY  1989  . [place]  . TENDON REPAIR Right 03/03/2013   Procedure: RIGHT DEBRIDEMENT AND TENOLYSIS OF PERONEOUS LONGOUS AND BREVIS TENDONS ;  Surgeon: Wylene Simmer, MD;  Location: [facility];  Service: Orthopedics;  Laterality: Right;   Family History  Problem Relation Age of Onset  . Arthritis Mother   . Hypertension Mother   . Diabetes Mother   . Kidney disease Mother   . Arrhythmia Mother        AFib  . Arthritis Sister   . Heart disease Sister   . Hypertension Brother   . Heart disease Maternal Aunt   . Heart failure Maternal Aunt   . Heart disease Maternal Uncle   . Cancer Maternal Uncle   . Heart disease Maternal Grandfather   . Heart attack Maternal Grandfather 36       MI    Social History  Substance Use Topics  . Smoking status: Never Smoker  . Smokeless tobacco: Never Used  . Alcohol use Yes     Comment: occasional   OB History    No data available     Review of Systems  Musculoskeletal:       Shoulder pain  Skin: Negative.   Neurological: Negative.   All other systems reviewed and are negative.   Allergies  Shellfish allergy  Home Medications   Prior to Admission medications   Medication Sig Start Date End Date Taking? Authorizing Provider  albuterol (PROVENTIL HFA;VENTOLIN HFA) 108 (90 Base) MCG/ACT inhaler Inhale 2 puffs into the lungs every 6 (six) hours as needed for wheezing or shortness of breath. 08/08/16   Janora Norlander, DO  amLODipine (NORVASC) 5 MG tablet Take 1 tablet (5 mg total) by mouth daily. 12/28/15   Sela Hilding, MD  benzonatate (TESSALON) 200 MG capsule Take 1 capsule (200 mg total) by mouth 2 (two) times daily as needed for cough. 08/08/16   Janora Norlander, DO  ibuprofen (ADVIL,MOTRIN) 600 MG tablet Take 1 tablet (600 mg total) by mouth 2 (two) times daily as needed. 01/01/16   Thurman Coyer, DO  predniSONE (STERAPRED UNI-PAK 21 TAB) 10 MG (21) TBPK tablet Take 6 tablets tomorrow, decrease by  1 each day till finished (6,5,4,3,2,1) 12/05/16   Barnet Glasgow, NP   Meds Ordered and Administered this Visit  Medications - No data to display  BP 122/78 (BP Location: Right Arm)   Pulse 78   Temp 98.6 F (37 C) (Oral)   Resp 18   SpO2 100%  No data found.   Physical Exam  Constitutional: She is oriented to person, place, and time. She appears well-developed and well-nourished. No distress.  HENT:  Head: Normocephalic and atraumatic.  Right Ear: External ear normal.  Left Ear: External ear normal.  Musculoskeletal:  Tenderness with palpation over the medial epicondyle, sensory function intact distally, capillary refill less than 2 seconds, able to form a full grip, there is pain with flexion of the  shoulder, and with abduction.  Neurological: She is alert and oriented to person, place, and time.  Skin: Skin is warm and dry. Capillary refill takes less than 2 seconds. No rash noted. She is not diaphoretic. No erythema.  Psychiatric: She has a normal mood and affect. Her behavior is normal.  Nursing note and vitals reviewed.   Urgent Care Course   Procedures (including critical care time)  Labs Review Labs Reviewed - No data to display  Imaging Review No results found.      MDM   1. Cervical radiculopathy    Recommend rest, ice, avoid repetitive motions, started on steroid taper, follow-up with primary care if pain persists   Barnet Glasgow, NP 12/05/16 [place]

## 2016-12-05 NOTE — ED Triage Notes (Signed)
Pt  Reports  Pain  l   Arm         Radiating  Down      Pt     Has  Neck  Pain   As   Well   -      Pt  Reports   -        Pt  Denies   Any       specefic  Injury          Pt  Ambulated  To   Room    -    Pt  Reports  She  Uses   repetative  Lifting  At  Work

## 2016-12-06 ENCOUNTER — Emergency Department (HOSPITAL_COMMUNITY): Payer: BLUE CROSS/BLUE SHIELD

## 2016-12-06 ENCOUNTER — Emergency Department (HOSPITAL_COMMUNITY)
Admission: EM | Admit: 2016-12-06 | Discharge: 2016-12-07 | Disposition: A | Discharge status: Home / Self Care | Payer: BLUE CROSS/BLUE SHIELD | Attending: Emergency Medicine | Admitting: Emergency Medicine

## 2016-12-06 ENCOUNTER — Encounter (HOSPITAL_COMMUNITY): Payer: Self-pay | Admitting: Emergency Medicine

## 2016-12-06 DIAGNOSIS — Z791 Long term (current) use of non-steroidal anti-inflammatories (NSAID): Secondary | ICD-10-CM | POA: Insufficient documentation

## 2016-12-06 DIAGNOSIS — M5412 Radiculopathy, cervical region: Secondary | ICD-10-CM | POA: Insufficient documentation

## 2016-12-06 DIAGNOSIS — M79602 Pain in left arm: Secondary | ICD-10-CM | POA: Diagnosis not present

## 2016-12-06 DIAGNOSIS — I10 Essential (primary) hypertension: Secondary | ICD-10-CM | POA: Diagnosis not present

## 2016-12-06 DIAGNOSIS — J45909 Unspecified asthma, uncomplicated: Secondary | ICD-10-CM | POA: Insufficient documentation

## 2016-12-06 DIAGNOSIS — R200 Anesthesia of skin: Secondary | ICD-10-CM | POA: Diagnosis not present

## 2016-12-06 DIAGNOSIS — Z79899 Other long term (current) drug therapy: Secondary | ICD-10-CM | POA: Diagnosis not present

## 2016-12-06 LAB — BASIC METABOLIC PANEL
ANION GAP: 9 (ref 5–15)
BUN: 18 mg/dL (ref 6–20)
CHLORIDE: 101 mmol/L (ref 101–111)
CO2: 28 mmol/L (ref 22–32)
CREATININE: 0.96 mg/dL (ref 0.44–1.00)
Calcium: 9.2 mg/dL (ref 8.9–10.3)
GFR calc non Af Amer: >60 mL/min (ref >60)
Glucose, Bld: 202 mg/dL — ABNORMAL HIGH (ref 65–99)
POTASSIUM: 4.1 mmol/L (ref 3.5–5.1)
Sodium: 138 mmol/L (ref 135–145)

## 2016-12-06 LAB — CBC
HEMATOCRIT: 39.8 % (ref 36.0–46.0)
HEMOGLOBIN: 13.2 g/dL (ref 12.0–15.0)
MCH: 31.7 pg (ref 26.0–34.0)
MCHC: 33.2 g/dL (ref 30.0–36.0)
MCV: 95.7 fL (ref 78.0–100.0)
Platelets: 290 10*3/uL (ref 150–400)
RBC: 4.16 MIL/uL (ref 3.87–5.11)
RDW: 13.2 % (ref 11.5–15.5)
WBC: 7.8 10*3/uL (ref 4.0–10.5)

## 2016-12-06 LAB — I-STAT TROPONIN, ED: Troponin i, poc: 0 ng/mL (ref 0.00–0.08)

## 2016-12-06 NOTE — ED Provider Notes (Signed)
[facility] DEPT Provider Note   CSN: [id number] Arrival date & time: 12/06/16  1847     History   Chief Complaint Chief Complaint  Patient presents with  . Arm Pain    HPI Karen Ferguson is a 61 y.o. female.  This is 61 year old female who presents with pain that starts in the neck, radiates to the apex of the left shoulder and down her arm to her fingertips.  This is been going on for approximately one week.  She was seen at urgent care yesterday, started on a steroid taper.  She states has not helped her discomfort at all. She does have a history of degenerative disc disease in her lumbar spine and osteoarthritis. She denies any chest pain or shortness of breath      Past Medical History:  Diagnosis Date  . Arthritis   . Asthma    Albuterol prn  . Bronchitis   . Degenerative disc disease   . Depression   . History of echocardiogram    Echo 1/18: EF 60-65, no RWMA, Gr 1 DD, PASP 20  . Hypertension   . PVC's (premature ventricular contractions)    Holter 12/17: NSR, occ PAC/PVCs, no AFib; one 4 beat run NSVT    Patient Active Problem List   Diagnosis Date Noted  . Palpitations 04/01/2016  . Family history of premature coronary heart disease 04/01/2016  . Obstructive sleep apnea 12/28/2015  . Bilateral leg numbness 07/03/2015  . GERD (gastroesophageal reflux disease) 07/03/2015  . Constipation 07/03/2015  . Dizziness and giddiness 01/10/2015  . Cyst and pseudocyst of pancreas 03/16/2012  . 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  . Degenerative disc disease   . Asthma exacerbation 06/10/2011  . Obesity 10/17/2010  . DENTAL CARIES 12/15/2008  . OSTEOARTHRITIS, GENERALIZED, MULTIPLE JOINTS 12/15/2008  . Essential hypertension 09/18/2007  . OVERACTIVE BLADDER 04/29/2007  . RECTAL BLEEDING 09/23/2006    Past Surgical History:  Procedure Laterality Date  . ABDOMINAL HYSTERECTOMY  1988   for fibroid tumors  . [place]   rt  .  CHOLECYSTECTOMY  1989  . [place]  . TENDON REPAIR Right 03/03/2013   Procedure: RIGHT DEBRIDEMENT AND TENOLYSIS OF PERONEOUS LONGOUS AND BREVIS TENDONS ;  Surgeon: Wylene Simmer, MD;  Location: [facility];  Service: Orthopedics;  Laterality: Right;    OB History    No data available       Home Medications    Prior to Admission medications   Medication Sig Start Date End Date Taking? Authorizing Provider  albuterol (PROVENTIL HFA;VENTOLIN HFA) 108 (90 Base) MCG/ACT inhaler Inhale 2 puffs into the lungs every 6 (six) hours as needed for wheezing or shortness of breath. 08/08/16  Yes Gottschalk, Leatrice Jewels M, DO  amLODipine (NORVASC) 5 MG tablet Take 1 tablet (5 mg total) by mouth daily. 12/28/15  Yes Sela Hilding, MD  predniSONE (STERAPRED UNI-PAK 21 TAB) 10 MG (21) TBPK tablet Take 6 tablets tomorrow, decrease by 1 each day till finished (6,5,4,3,2,1) 12/05/16  Yes Barnet Glasgow, NP  benzonatate (TESSALON) 200 MG capsule Take 1 capsule (200 mg total) by mouth 2 (two) times daily as needed for cough. Patient not taking: Reported on 12/06/2016 08/08/16   Janora Norlander, DO  ibuprofen (ADVIL,MOTRIN) 600 MG tablet Take 1 tablet (600 mg total) by mouth 2 (two) times daily as needed. Patient not taking: Reported on 12/06/2016 01/01/16   Thurman Coyer, DO  methocarbamol (ROBAXIN) 500 MG tablet Take  1 tablet (500 mg total) by mouth 2 (two) times daily. 12/07/16   Junius Creamer, NP    Family History Family History  Problem Relation Age of Onset  . Arthritis Mother   . Hypertension Mother   . Diabetes Mother   . Kidney disease Mother   . Arrhythmia Mother        AFib  . Arthritis Sister   . Heart disease Sister   . Hypertension Brother   . Heart disease Maternal Aunt   . Heart failure Maternal Aunt   . Heart disease Maternal Uncle   . Cancer Maternal Uncle   . Heart disease Maternal Grandfather   . Heart attack Maternal Grandfather 40       MI     Social History Social History  Substance Use Topics  . Smoking status: Never Smoker  . Smokeless tobacco: Never Used  . Alcohol use Yes     Comment: occasional     Allergies   Shellfish allergy   Review of Systems Review of Systems  Musculoskeletal: Positive for myalgias and neck pain.  Neurological: Negative for weakness and numbness.     Physical Exam Updated Vital Signs BP 133/68   Pulse 74   Temp 98.5 F (36.9 C) (Oral)   Resp 17   Ht 5\' 5"  (1.651 m)   Wt 96.6 kg (213 lb)   SpO2 97%   BMI 35.45 kg/m   Physical Exam  Constitutional: She appears well-developed and well-nourished.  HENT:  Head: Normocephalic.  Eyes: Pupils are equal, round, and reactive to light.  Neck: Spinous process tenderness and muscular tenderness present. Decreased range of motion present.    Cardiovascular: Normal rate.   Pulmonary/Chest: Effort normal.  Abdominal: Soft.  Neurological: She is alert.  Skin: Skin is warm. No rash noted. No erythema.  Psychiatric: She has a normal mood and affect.  Nursing note and vitals reviewed.    ED Treatments / Results  Labs (all labs ordered are listed, but only abnormal results are displayed) Labs Reviewed  BASIC METABOLIC PANEL - Abnormal; Notable for the following:       Result Value   Glucose, Bld 202 (*)    All other components within normal limits  CBC  I-STAT TROPOININ, ED    EKG  EKG Interpretation  Date/Time:  Saturday December 06 2016 18:49:35 EDT Ventricular Rate:  85 PR Interval:  146 QRS Duration: 76 QT Interval:  378 QTC Calculation: 449 R Axis:   33 Text Interpretation:  Normal sinus rhythm Normal ECG When compared with ECG of 04/01/2016, No significant change was found Confirmed by Delora Fuel ([id number]) on 12/06/2016 11:21:14 PM       Radiology Dg Chest 2 View  Result Date: 12/06/2016 CLINICAL DATA:  61 year old female with history of left-sided arm pain radiating into the left side of the neck and shoulder.  EXAM: CHEST  2 VIEW COMPARISON:  Chest x-ray a 11/23/2011. FINDINGS: Lung volumes are normal. No consolidative airspace disease. No pleural effusions. No pneumothorax. No pulmonary nodule or mass noted. Pulmonary vasculature and the cardiomediastinal silhouette are within normal limits. Aortic atherosclerosis. Surgical clips project over the right upper quadrant of the abdomen, potentially from prior cholecystectomy. IMPRESSION: 1.  No radiographic evidence of acute cardiopulmonary disease. 2. Aortic atherosclerosis. Electronically Signed   By: Vinnie Langton M.D.   On: 12/06/2016 19:31   Ct Cervical Spine Wo Contrast  Result Date: 12/06/2016 CLINICAL DATA:  LEFT arm radiculopathy. Bilateral feet numbness. History of  degenerative disc disease. EXAM: CT CERVICAL SPINE WITHOUT CONTRAST TECHNIQUE: Multidetector CT imaging of the cervical spine was performed without intravenous contrast. Multiplanar CT image reconstructions were also generated. COMPARISON:  None. FINDINGS: ALIGNMENT: Straightened lordosis. Vertebral bodies in alignment. SKULL BASE AND VERTEBRAE: Cervical vertebral bodies and posterior elements are intact. Small RIGHT C7 rib. Intervertebral disc heights preserved. No destructive bony lesions. C1-2 articulation maintained. Congenitally capacious canal. SOFT TISSUES AND SPINAL CANAL: Nonacute. Nuchal ligament calcifications. Trace LEFT carotid artery calcification. DISC LEVELS: No significant osseous canal stenosis or neural foraminal narrowing. UPPER CHEST: Lung apices are clear. OTHER: None. IMPRESSION: Negative CT cervical spine for age. Electronically Signed   By: Elon Alas M.D.   On: 12/06/2016 23:37    Procedures Procedures (including critical care time)  Medications Ordered in ED Medications - No data to display   Initial Impression / Assessment and Plan / ED Course  I have reviewed the triage vital signs and the nursing notes.  Pertinent labs & imaging results that were  available during my care of the patient were reviewed by me and considered in my medical decision making (see chart for details).      CT Scan normal have added Robaxin  To FU with PCP next week   Final Clinical Impressions(s) / ED Diagnoses   Final diagnoses:  Cervical radiculitis  Cervical radiculopathy    New Prescriptions New Prescriptions   METHOCARBAMOL (ROBAXIN) 500 MG TABLET    Take 1 tablet (500 mg total) by mouth 2 (two) times daily.     Junius Creamer, NP 12/07/16 Hunt, [place], MD 12/07/16 [phone]

## 2016-12-06 NOTE — ED Provider Notes (Signed)
[facility] DEPT Provider Note   CSN: [id number] Arrival date & time: 12/06/16  1847     History   Chief Complaint Chief Complaint  Patient presents with  . Arm Pain    HPI Karen Ferguson is a 61 y.o. female.  HPI  Past Medical History:  Diagnosis Date  . Arthritis   . Asthma    Albuterol prn  . Bronchitis   . Degenerative disc disease   . Depression   . History of echocardiogram    Echo 1/18: EF 60-65, no RWMA, Gr 1 DD, PASP 20  . Hypertension   . PVC's (premature ventricular contractions)    Holter 12/17: NSR, occ PAC/PVCs, no AFib; one 4 beat run NSVT    Patient Active Problem List   Diagnosis Date Noted  . Palpitations 04/01/2016  . Family history of premature coronary heart disease 04/01/2016  . Obstructive sleep apnea 12/28/2015  . Bilateral leg numbness 07/03/2015  . GERD (gastroesophageal reflux disease) 07/03/2015  . Constipation 07/03/2015  . Dizziness and giddiness 01/10/2015  . Cyst and pseudocyst of pancreas 03/16/2012  . 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  . Degenerative disc disease   . Asthma exacerbation 06/10/2011  . Obesity 10/17/2010  . DENTAL CARIES 12/15/2008  . OSTEOARTHRITIS, GENERALIZED, MULTIPLE JOINTS 12/15/2008  . Essential hypertension 09/18/2007  . OVERACTIVE BLADDER 04/29/2007  . RECTAL BLEEDING 09/23/2006    Past Surgical History:  Procedure Laterality Date  . ABDOMINAL HYSTERECTOMY  1988   for fibroid tumors  . [place]   rt  . CHOLECYSTECTOMY  1989  . [place]  . TENDON REPAIR Right 03/03/2013   Procedure: RIGHT DEBRIDEMENT AND TENOLYSIS OF PERONEOUS LONGOUS AND BREVIS TENDONS ;  Surgeon: Wylene Simmer, MD;  Location: [facility];  Service: Orthopedics;  Laterality: Right;    OB History    No data available       Home Medications    Prior to Admission medications   Medication Sig Start Date End Date Taking? Authorizing Provider  albuterol (PROVENTIL  HFA;VENTOLIN HFA) 108 (90 Base) MCG/ACT inhaler Inhale 2 puffs into the lungs every 6 (six) hours as needed for wheezing or shortness of breath. 08/08/16  Yes Gottschalk, Leatrice Jewels M, DO  amLODipine (NORVASC) 5 MG tablet Take 1 tablet (5 mg total) by mouth daily. 12/28/15  Yes Sela Hilding, MD  predniSONE (STERAPRED UNI-PAK 21 TAB) 10 MG (21) TBPK tablet Take 6 tablets tomorrow, decrease by 1 each day till finished (6,5,4,3,2,1) 12/05/16  Yes Barnet Glasgow, NP  benzonatate (TESSALON) 200 MG capsule Take 1 capsule (200 mg total) by mouth 2 (two) times daily as needed for cough. Patient not taking: Reported on 12/06/2016 08/08/16   Janora Norlander, DO  ibuprofen (ADVIL,MOTRIN) 600 MG tablet Take 1 tablet (600 mg total) by mouth 2 (two) times daily as needed. Patient not taking: Reported on 12/06/2016 01/01/16   Thurman Coyer, DO  methocarbamol (ROBAXIN) 500 MG tablet Take 1 tablet (500 mg total) by mouth 2 (two) times daily. 12/07/16   Junius Creamer, NP    Family History Family History  Problem Relation Age of Onset  . Arthritis Mother   . Hypertension Mother   . Diabetes Mother   . Kidney disease Mother   . Arrhythmia Mother        AFib  . Arthritis Sister   . Heart disease Sister   . Hypertension Brother   . Heart disease Maternal Aunt   .  Heart failure Maternal Aunt   . Heart disease Maternal Uncle   . Cancer Maternal Uncle   . Heart disease Maternal Grandfather   . Heart attack Maternal Grandfather 43       MI    Social History Social History  Substance Use Topics  . Smoking status: Never Smoker  . Smokeless tobacco: Never Used  . Alcohol use Yes     Comment: occasional     Allergies   Shellfish allergy   Review of Systems Review of Systems   Physical Exam Updated Vital Signs BP 135/74   Pulse 72   Temp 98.5 F (36.9 C) (Oral)   Resp 17   Ht 5\' 5"  (1.651 m)   Wt 96.6 kg (213 lb)   SpO2 96%   BMI 35.45 kg/m   Physical Exam   ED Treatments /  Results  Labs (all labs ordered are listed, but only abnormal results are displayed) Labs Reviewed  BASIC METABOLIC PANEL - Abnormal; Notable for the following:       Result Value   Glucose, Bld 202 (*)    All other components within normal limits  CBC  I-STAT TROPOININ, ED    EKG  EKG Interpretation  Date/Time:  Saturday December 06 2016 18:49:35 EDT Ventricular Rate:  85 PR Interval:  146 QRS Duration: 76 QT Interval:  378 QTC Calculation: 449 R Axis:   33 Text Interpretation:  Normal sinus rhythm Normal ECG When compared with ECG of 04/01/2016, No significant change was found Confirmed by Delora Fuel ([id number]) on 12/06/2016 11:21:14 PM       Radiology Dg Chest 2 View  Result Date: 12/06/2016 CLINICAL DATA:  61 year old female with history of left-sided arm pain radiating into the left side of the neck and shoulder. EXAM: CHEST  2 VIEW COMPARISON:  Chest x-ray a 11/23/2011. FINDINGS: Lung volumes are normal. No consolidative airspace disease. No pleural effusions. No pneumothorax. No pulmonary nodule or mass noted. Pulmonary vasculature and the cardiomediastinal silhouette are within normal limits. Aortic atherosclerosis. Surgical clips project over the right upper quadrant of the abdomen, potentially from prior cholecystectomy. IMPRESSION: 1.  No radiographic evidence of acute cardiopulmonary disease. 2. Aortic atherosclerosis. Electronically Signed   By: Vinnie Langton M.D.   On: 12/06/2016 19:31   Ct Cervical Spine Wo Contrast  Result Date: 12/06/2016 CLINICAL DATA:  LEFT arm radiculopathy. Bilateral feet numbness. History of degenerative disc disease. EXAM: CT CERVICAL SPINE WITHOUT CONTRAST TECHNIQUE: Multidetector CT imaging of the cervical spine was performed without intravenous contrast. Multiplanar CT image reconstructions were also generated. COMPARISON:  None. FINDINGS: ALIGNMENT: Straightened lordosis. Vertebral bodies in alignment. SKULL BASE AND VERTEBRAE: Cervical  vertebral bodies and posterior elements are intact. Small RIGHT C7 rib. Intervertebral disc heights preserved. No destructive bony lesions. C1-2 articulation maintained. Congenitally capacious canal. SOFT TISSUES AND SPINAL CANAL: Nonacute. Nuchal ligament calcifications. Trace LEFT carotid artery calcification. DISC LEVELS: No significant osseous canal stenosis or neural foraminal narrowing. UPPER CHEST: Lung apices are clear. OTHER: None. IMPRESSION: Negative CT cervical spine for age. Electronically Signed   By: Elon Alas M.D.   On: 12/06/2016 23:37    Procedures Procedures (including critical care time)  Medications Ordered in ED Medications - No data to display   Initial Impression / Assessment and Plan / ED Course  I have reviewed the triage vital signs and the nursing notes.  Pertinent labs & imaging results that were available during my care of the patient were reviewed by me  and considered in my medical decision making (see chart for details).      Ct scan normal Patient to continue present medication I have added muscle relaxer   Final Clinical Impressions(s) / ED Diagnoses   Final diagnoses:  Cervical radiculitis  Cervical radiculopathy    New Prescriptions Discharge Medication List as of 12/07/2016 12:26 AM    START taking these medications   Details  methocarbamol (ROBAXIN) 500 MG tablet Take 1 tablet (500 mg total) by mouth 2 (two) times daily., Starting Sun 12/07/2016, Print         Junius Creamer, NP 12/07/16 [id number]    Fatima Blank, MD 12/08/16 0003

## 2016-12-06 NOTE — ED Triage Notes (Addendum)
Pt to ED from home c/o L arm pain from the forearm radiating up to shoulder and neck x 1 month. Pt reports going to [facility] yesterday and was told it was a pinched nerve in her neck, given prednisone there without relief. Pt states nothing makes it worse or better, it is just always aching. States the neck pain was worse before - unable to move neck side to side without pain/stiffness, but that has since gotten better, no difficulty moving neck now. Radial pulse strong bilaterally. Pt has full ROM of arm, sensation intact. Denies SOB/N/V/dizziness. Resp e/u, skin warm/dry.

## 2016-12-07 MED ORDER — METHOCARBAMOL 500 MG PO TABS: 500.0000 mg | ORAL_TABLET | Freq: Two times a day (BID) | ORAL | 0 refills | Status: DC

## 2016-12-07 NOTE — Discharge Instructions (Signed)
Tonight your CT Scan is normal Please continue your present medications. I have added a muscle relaxer take this as directed for the next several days

## 2016-12-07 NOTE — ED Notes (Signed)
Pt verbalized understanding discharge instructions and denies any further needs or questions at this time. VS stable, ambulatory and steady gait.   

## 2016-12-19 DIAGNOSIS — M5412 Radiculopathy, cervical region: Secondary | ICD-10-CM | POA: Diagnosis not present

## 2016-12-19 DIAGNOSIS — M25561 Pain in right knee: Secondary | ICD-10-CM | POA: Diagnosis not present

## 2016-12-19 MED FILL — METHOCARBAMOL 500 MG TABLET: 500 | 10 days supply | Qty: 20 | Fill #0

## 2016-12-19 MED FILL — traMADol HCL 50 MG TABS: 50 | 7 days supply | Qty: 28 | Fill #0

## 2017-01-21 MED FILL — VALSARTAN-HCTZ 80-12.5 MG T: 80-12.5 | 30 days supply | Qty: 30 | Fill #0

## 2017-04-13 MED FILL — VALSARTAN-HCTZ 80-12.5 MG T: 80-12.5 | 30 days supply | Qty: 30 | Fill #0

## 2017-05-27 MED FILL — VALSARTAN-HCTZ 80-12.5 MG T: 80-12.5 | 30 days supply | Qty: 30 | Fill #1

## 2017-07-08 MED FILL — VALSARTAN-HCTZ 80-12.5 MG T: 80-12.5 | 30 days supply | Qty: 30 | Fill #2

## 2017-09-07 DIAGNOSIS — N3281 Overactive bladder: Secondary | ICD-10-CM | POA: Diagnosis not present

## 2017-09-07 DIAGNOSIS — I10 Essential (primary) hypertension: Secondary | ICD-10-CM | POA: Diagnosis not present

## 2017-09-07 DIAGNOSIS — G4730 Sleep apnea, unspecified: Secondary | ICD-10-CM | POA: Diagnosis not present

## 2017-09-07 DIAGNOSIS — M150 Primary generalized (osteo)arthritis: Secondary | ICD-10-CM | POA: Diagnosis not present

## 2017-09-09 MED FILL — VALSARTAN-HCTZ 80-12.5 MG T: 80-12.5 | 30 days supply | Qty: 30 | Fill #0

## 2017-09-09 MED FILL — rOPINIRole HCL 0.5 MG TABS: 0.5 | 30 days supply | Qty: 120 | Fill #0

## 2017-10-23 MED FILL — VALSARTAN-HCTZ 80-12.5 MG T: 80-12.5 | 30 days supply | Qty: 30 | Fill #1

## 2017-10-30 DIAGNOSIS — F315 Bipolar disorder, current episode depressed, severe, with psychotic features: Secondary | ICD-10-CM | POA: Diagnosis not present

## 2017-11-02 DIAGNOSIS — F333 Major depressive disorder, recurrent, severe with psychotic symptoms: Secondary | ICD-10-CM | POA: Diagnosis not present

## 2017-11-05 DIAGNOSIS — R1314 Dysphagia, pharyngoesophageal phase: Secondary | ICD-10-CM | POA: Diagnosis not present

## 2017-11-05 DIAGNOSIS — K219 Gastro-esophageal reflux disease without esophagitis: Secondary | ICD-10-CM | POA: Diagnosis not present

## 2017-11-05 DIAGNOSIS — Z1211 Encounter for screening for malignant neoplasm of colon: Secondary | ICD-10-CM | POA: Diagnosis not present

## 2017-11-05 DIAGNOSIS — K625 Hemorrhage of anus and rectum: Secondary | ICD-10-CM | POA: Diagnosis not present

## 2017-11-05 MED FILL — PEG-3350 SOLUTION: 420 | 1 days supply | Qty: 4000 | Fill #0

## 2017-11-05 MED FILL — OLANZapine 5 MG TABS: 5 | 30 days supply | Qty: 30 | Fill #0

## 2017-11-05 MED FILL — PANTOPRAZOLE SOD DR 40 MG T: 40 | 30 days supply | Qty: 30 | Fill #0

## 2017-11-05 MED FILL — FLUoxetine HCL 10 MG CAPS: 10 | 30 days supply | Qty: 30 | Fill #0

## 2017-11-13 MED FILL — LINZESS 145 MCG CAPSULE: 145 | 90 days supply | Qty: 90 | Fill #0

## 2017-11-30 MED FILL — VALSARTAN-HCTZ 80-12.5 MG T: 80-12.5 | 30 days supply | Qty: 30 | Fill #2

## 2017-12-07 DIAGNOSIS — G2581 Restless legs syndrome: Secondary | ICD-10-CM | POA: Diagnosis not present

## 2017-12-07 DIAGNOSIS — I10 Essential (primary) hypertension: Secondary | ICD-10-CM | POA: Diagnosis not present

## 2017-12-07 DIAGNOSIS — G4730 Sleep apnea, unspecified: Secondary | ICD-10-CM | POA: Diagnosis not present

## 2017-12-07 DIAGNOSIS — M150 Primary generalized (osteo)arthritis: Secondary | ICD-10-CM | POA: Diagnosis not present

## 2017-12-11 MED FILL — ALPRAZolam 0.5 MG TABS: 0.5 | 30 days supply | Qty: 90 | Fill #0

## 2018-01-28 MED FILL — VALSARTAN-HCTZ 80-12.5 MG T: 80-12.5 | 30 days supply | Qty: 30 | Fill #0

## 2018-02-10 DIAGNOSIS — M25561 Pain in right knee: Secondary | ICD-10-CM | POA: Diagnosis not present

## 2018-03-19 MED FILL — VALSARTAN-HCTZ 80-12.5 MG T: 80-12.5 | 30 days supply | Qty: 30 | Fill #1

## 2018-04-06 ENCOUNTER — Other Ambulatory Visit (HOSPITAL_COMMUNITY): Payer: Self-pay | Admitting: Orthopedic Surgery

## 2018-04-06 ENCOUNTER — Ambulatory Visit (HOSPITAL_COMMUNITY)
Admission: RE | Admit: 2018-04-06 | Discharge: 2018-04-06 | Disposition: A | Discharge status: Home / Self Care | Payer: BLUE CROSS/BLUE SHIELD | Source: Ambulatory Visit | Attending: Family | Admitting: Family

## 2018-04-06 DIAGNOSIS — R0989 Other specified symptoms and signs involving the circulatory and respiratory systems: Secondary | ICD-10-CM

## 2018-04-06 MED FILL — ALPRAZolam 0.5 MG TABS: 0.5 | 30 days supply | Qty: 90 | Fill #1

## 2018-04-08 DIAGNOSIS — S83241A Other tear of medial meniscus, current injury, right knee, initial encounter: Secondary | ICD-10-CM | POA: Diagnosis not present

## 2018-04-08 DIAGNOSIS — S83231A Complex tear of medial meniscus, current injury, right knee, initial encounter: Secondary | ICD-10-CM | POA: Diagnosis not present

## 2018-04-08 DIAGNOSIS — G8918 Other acute postprocedural pain: Secondary | ICD-10-CM | POA: Diagnosis not present

## 2018-04-08 DIAGNOSIS — M94261 Chondromalacia, right knee: Secondary | ICD-10-CM | POA: Diagnosis not present

## 2018-04-08 DIAGNOSIS — X58XXXA Exposure to other specified factors, initial encounter: Secondary | ICD-10-CM | POA: Diagnosis not present

## 2018-04-08 DIAGNOSIS — S83271A Complex tear of lateral meniscus, current injury, right knee, initial encounter: Secondary | ICD-10-CM | POA: Diagnosis not present

## 2018-04-08 DIAGNOSIS — Y999 Unspecified external cause status: Secondary | ICD-10-CM | POA: Diagnosis not present

## 2018-04-08 DIAGNOSIS — S83281A Other tear of lateral meniscus, current injury, right knee, initial encounter: Secondary | ICD-10-CM | POA: Diagnosis not present

## 2018-04-09 MED FILL — ONDANSETRON HCL 4 MG TABLET: 4 | 7 days supply | Qty: 20 | Fill #0

## 2018-04-09 MED FILL — HYDROCODON-APAP 5-325: 5-325 | 4 days supply | Qty: 30 | Fill #0

## 2018-04-09 MED FILL — ASPIRIN ADULT LOW STRENGTH: 81 | 14 days supply | Qty: 28 | Fill #0

## 2018-04-21 DIAGNOSIS — S83281D Other tear of lateral meniscus, current injury, right knee, subsequent encounter: Secondary | ICD-10-CM | POA: Diagnosis not present

## 2018-04-21 DIAGNOSIS — S83241D Other tear of medial meniscus, current injury, right knee, subsequent encounter: Secondary | ICD-10-CM | POA: Diagnosis not present

## 2018-04-27 ENCOUNTER — Encounter: Payer: Self-pay | Admitting: Neurology

## 2018-04-29 DIAGNOSIS — R202 Paresthesia of skin: Secondary | ICD-10-CM | POA: Diagnosis not present

## 2018-04-29 DIAGNOSIS — M150 Primary generalized (osteo)arthritis: Secondary | ICD-10-CM | POA: Diagnosis not present

## 2018-05-03 DIAGNOSIS — F315 Bipolar disorder, current episode depressed, severe, with psychotic features: Secondary | ICD-10-CM | POA: Diagnosis not present

## 2018-05-13 DIAGNOSIS — M130 Polyarthritis, unspecified: Secondary | ICD-10-CM | POA: Diagnosis not present

## 2018-05-13 DIAGNOSIS — G4730 Sleep apnea, unspecified: Secondary | ICD-10-CM | POA: Diagnosis not present

## 2018-05-13 DIAGNOSIS — R202 Paresthesia of skin: Secondary | ICD-10-CM | POA: Diagnosis not present

## 2018-05-13 DIAGNOSIS — F310 Bipolar disorder, current episode hypomanic: Secondary | ICD-10-CM | POA: Diagnosis not present

## 2018-05-13 MED FILL — VALSARTAN-HCTZ 80-12.5 MG T: 80-12.5 | 30 days supply | Qty: 30 | Fill #0

## 2018-05-24 DIAGNOSIS — S83241D Other tear of medial meniscus, current injury, right knee, subsequent encounter: Secondary | ICD-10-CM | POA: Diagnosis not present

## 2018-05-24 DIAGNOSIS — S83281D Other tear of lateral meniscus, current injury, right knee, subsequent encounter: Secondary | ICD-10-CM | POA: Diagnosis not present

## 2018-06-08 MED FILL — REXULTI 0.5 MG TABLET: 0.5 | 30 days supply | Qty: 30 | Fill #0

## 2018-07-02 MED FILL — VALSARTAN-HCTZ 80-12.5 MG T: 80-12.5 | 30 days supply | Qty: 30 | Fill #1 | Status: TO

## 2018-07-05 ENCOUNTER — Encounter

## 2018-07-05 ENCOUNTER — Ambulatory Visit: Payer: BLUE CROSS/BLUE SHIELD | Admitting: Neurology

## 2018-07-05 NOTE — Progress Notes (Deleted)
[facility] Clinic Note - Initial Visit   Date: 07/05/18  Karen Ferguson MRN: [id number] DOB: [birth year]   Dear Dr. Percell Miller:  Thank you for your kind referral of Karen Ferguson for consultation of bilateral feet paresthesias. Although her history is well known to you, please allow [place] to reiterate it for the purpose of our medical record. The patient was accompanied to the clinic by *** who also provides collateral information.     History of Present Illness: Karen Ferguson is a 63 y.o. ***-handed African American female with *** presenting for evaluation of bilateral toe paresthesias.    Starting around ***, she has noticed ***  Out-side paper records, electronic medical record, and images have been reviewed where available and summarized as: *** CT cervical spine 12/06/2016:  Negative CT cervical spine for age.  Past Medical History:  Diagnosis Date  . Arthritis   . Asthma    Albuterol prn  . Bronchitis   . Degenerative disc disease   . Depression   . History of echocardiogram    Echo 1/18: EF 60-65, no RWMA, Gr 1 DD, PASP 20  . Hypertension   . PVC's (premature ventricular contractions)    Holter 12/17: NSR, occ PAC/PVCs, no AFib; one 4 beat run NSVT    Past Surgical History:  Procedure Laterality Date  . ABDOMINAL HYSTERECTOMY  1988   for fibroid tumors  . [place]   rt  . CHOLECYSTECTOMY  1989  . [place]  . TENDON REPAIR Right 03/03/2013   Procedure: RIGHT DEBRIDEMENT AND TENOLYSIS OF PERONEOUS LONGOUS AND BREVIS TENDONS ;  Surgeon: Wylene Simmer, MD;  Location: [facility];  Service: Orthopedics;  Laterality: Right;     Medications:  Outpatient Encounter Medications as of 07/05/2018  Medication Sig Note  . albuterol (PROVENTIL HFA;VENTOLIN HFA) 108 (90 Base) MCG/ACT inhaler Inhale 2 puffs into the lungs every 6 (six) hours as needed for wheezing or shortness of breath.   Marland Kitchen amLODipine  (NORVASC) 5 MG tablet Take 1 tablet (5 mg total) by mouth daily.   . benzonatate (TESSALON) 200 MG capsule Take 1 capsule (200 mg total) by mouth 2 (two) times daily as needed for cough. (Patient not taking: Reported on 12/06/2016)   . ibuprofen (ADVIL,MOTRIN) 600 MG tablet Take 1 tablet (600 mg total) by mouth 2 (two) times daily as needed. (Patient not taking: Reported on 12/06/2016)   . methocarbamol (ROBAXIN) 500 MG tablet Take 1 tablet (500 mg total) by mouth 2 (two) times daily.   . predniSONE (STERAPRED UNI-PAK 21 TAB) 10 MG (21) TBPK tablet Take 6 tablets tomorrow, decrease by 1 each day till finished (6,5,4,3,2,1) 12/06/2016: Started 12/06/16 for 6 days   No facility-administered encounter medications on file as of 07/05/2018.      Allergies:  Allergies  Allergen Reactions  . Shellfish Allergy Hives    Family History: Family History  Problem Relation Age of Onset  . Arthritis Mother   . Hypertension Mother   . Diabetes Mother   . Kidney disease Mother   . Arrhythmia Mother        AFib  . Arthritis Sister   . Heart disease Sister   . Hypertension Brother   . Heart disease Maternal Aunt   . Heart failure Maternal Aunt   . Heart disease Maternal Uncle   . Cancer Maternal Uncle   . Heart disease Maternal Grandfather   . Heart attack  Maternal Grandfather 23       MI    Social History: Social History   Tobacco Use  . Smoking status: Never Smoker  . Smokeless tobacco: Never Used  Substance Use Topics  . Alcohol use: Yes    Comment: occasional  . Drug use: No   Social History   Social History Narrative   Works at [employer] in [employer].     Single   1 daughter - passed away at age 58   Lives with her 2 grandchildren.    Review of Systems:  CONSTITUTIONAL: No fevers, chills, night sweats, or weight loss.  *** EYES: No visual changes or eye pain ENT: No hearing changes.  No history of nose bleeds.   RESPIRATORY: No cough, wheezing and  shortness of breath.   CARDIOVASCULAR: Negative for chest pain, and palpitations.   GI: Negative for abdominal discomfort, blood in stools or black stools.  No recent change in bowel habits.   GU:  No history of incontinence.   MUSCLOSKELETAL: No history of joint pain or swelling.  No myalgias.   SKIN: Negative for lesions, rash, and itching.   HEMATOLOGY/ONCOLOGY: Negative for prolonged bleeding, bruising easily, and swollen nodes.  No history of cancer.   ENDOCRINE: Negative for cold or heat intolerance, polydipsia or goiter.   PSYCH:  ***depression or anxiety symptoms.   NEURO: As Above.   Vital Signs:  There were no vitals taken for this visit. Pain Scale: *** on a scale of 0-10   General Medical Exam:  *** General:  Well appearing, comfortable.   Eyes/ENT: see cranial nerve examination.   Neck: No masses appreciated.  Full range of motion without tenderness.  No carotid bruits. Respiratory:  Clear to auscultation, good air entry bilaterally.   Cardiac:  Regular rate and rhythm, no murmur.   Extremities:  No deformities, edema, or skin discoloration.  Skin:  No rashes or lesions.  Neurological Exam: MENTAL STATUS including orientation to time, place, person, recent and remote memory, attention span and concentration, language, and fund of knowledge is ***normal.  Speech is not dysarthric.  CRANIAL NERVES: II:  No visual field defects.  Unremarkable fundi.   III-IV-VI: Pupils equal round and reactive to light.  Normal conjugate, extra-ocular eye movements in all directions of gaze.  No nystagmus.  No ptosis***.   V:  Normal facial sensation.  Jaw jerk is ***.   VII:  Normal facial symmetry and movements.  No pathologic facial reflexes.  VIII:  Normal hearing and vestibular function.   IX-X:  Normal palatal movement.   XI:  Normal shoulder shrug and head rotation.   XII:  Normal tongue strength and range of motion, no deviation or fasciculation.  MOTOR:  No atrophy,  fasciculations or abnormal movements.  No pronator drift.  Tone is normal.    Right Upper Extremity:    Left Upper Extremity:    Deltoid  5/5   Deltoid  5/5   Biceps  5/5   Biceps  5/5   Triceps  5/5   Triceps  5/5   Wrist extensors  5/5   Wrist extensors  5/5   Wrist flexors  5/5   Wrist flexors  5/5   Finger extensors  5/5   Finger extensors  5/5   Finger flexors  5/5   Finger flexors  5/5   Dorsal interossei  5/5   Dorsal interossei  5/5   Abductor pollicis  5/5   Abductor  pollicis  5/5   Tone (Ashworth scale)  0  Tone (Ashworth scale)  0   Right Lower Extremity:    Left Lower Extremity:    Hip flexors  5/5   Hip flexors  5/5   Hip extensors  5/5   Hip extensors  5/5   Knee flexors  5/5   Knee flexors  5/5   Knee extensors  5/5   Knee extensors  5/5   Dorsiflexors  5/5   Dorsiflexors  5/5   Plantarflexors  5/5   Plantarflexors  5/5   Toe extensors  5/5   Toe extensors  5/5   Toe flexors  5/5   Toe flexors  5/5   Tone (Ashworth scale)  0  Tone (Ashworth scale)  0   MSRs:  Right                                                                 Left brachioradialis 2+  brachioradialis 2+  biceps 2+  biceps 2+  triceps 2+  triceps 2+  patellar 2+  patellar 2+  ankle jerk 2+  ankle jerk 2+  Hoffman no  Hoffman no  plantar response down  plantar response down   SENSORY:  Normal and symmetric perception of light touch, pinprick, vibration, and proprioception.  Romberg's sign absent.   COORDINATION/GAIT: Normal finger-to- nose-finger and heel-to-shin.  Intact rapid alternating movements bilaterally.  Able to rise from a chair without using arms.  Gait narrow based and stable. Tandem and stressed gait intact.    IMPRESSION: ***  PLAN/RECOMMENDATIONS:  *** Return to clinic in *** months.   The duration of this appointment visit was *** minutes of face-to-face time with the patient.  Greater than 50% of this time was spent in counseling, explanation of diagnosis, planning of  further management, and coordination of care.   Thank you for allowing me to participate in patient's care.  If I can answer any additional questions, I would be pleased to do so.    Sincerely,    Donika K. Posey Pronto, DO

## 2018-07-16 ENCOUNTER — Ambulatory Visit: Payer: BLUE CROSS/BLUE SHIELD | Admitting: Student in an Organized Health Care Education/Training Program

## 2018-09-10 ENCOUNTER — Ambulatory Visit: Payer: Self-pay | Admitting: Neurology

## 2018-09-16 MED FILL — VALSARTAN-HCTZ 80-12.5 MG T: 80-12.5 | 30 days supply | Qty: 30 | Fill #0

## 2018-10-11 DIAGNOSIS — G4733 Obstructive sleep apnea (adult) (pediatric): Secondary | ICD-10-CM | POA: Diagnosis not present

## 2018-10-11 DIAGNOSIS — I10 Essential (primary) hypertension: Secondary | ICD-10-CM | POA: Diagnosis not present

## 2018-10-11 DIAGNOSIS — G2581 Restless legs syndrome: Secondary | ICD-10-CM | POA: Diagnosis not present

## 2018-10-11 DIAGNOSIS — Z1331 Encounter for screening for depression: Secondary | ICD-10-CM | POA: Diagnosis not present

## 2018-10-11 DIAGNOSIS — J4540 Moderate persistent asthma, uncomplicated: Secondary | ICD-10-CM | POA: Diagnosis not present

## 2018-10-15 DIAGNOSIS — M25561 Pain in right knee: Secondary | ICD-10-CM | POA: Diagnosis not present

## 2018-10-15 DIAGNOSIS — M48062 Spinal stenosis, lumbar region with neurogenic claudication: Secondary | ICD-10-CM | POA: Diagnosis not present

## 2018-10-15 DIAGNOSIS — M5441 Lumbago with sciatica, right side: Secondary | ICD-10-CM | POA: Diagnosis not present

## 2018-10-15 DIAGNOSIS — G5602 Carpal tunnel syndrome, left upper limb: Secondary | ICD-10-CM | POA: Diagnosis not present

## 2018-10-28 ENCOUNTER — Encounter: Payer: Self-pay | Admitting: Neurology

## 2018-10-29 ENCOUNTER — Telehealth (INDEPENDENT_AMBULATORY_CARE_PROVIDER_SITE_OTHER): Payer: BLUE CROSS/BLUE SHIELD | Admitting: Neurology

## 2018-11-03 MED FILL — VALSARTAN-HCTZ 80-12.5 MG T: 80-12.5 | 90 days supply | Qty: 90 | Fill #0

## 2018-11-03 NOTE — Progress Notes (Signed)
Error- please disregard

## 2018-12-05 IMAGING — DX DG CHEST 2V
2 series · 2 of 2 positions shown · non-contrast
Comparison: Chest x-ray a 11/23/2011.

CLINICAL DATA: 60-year-old female with history of left-sided arm
pain radiating into the left side of the neck and shoulder.

EXAM:
CHEST  2 VIEW

[w chest pa]
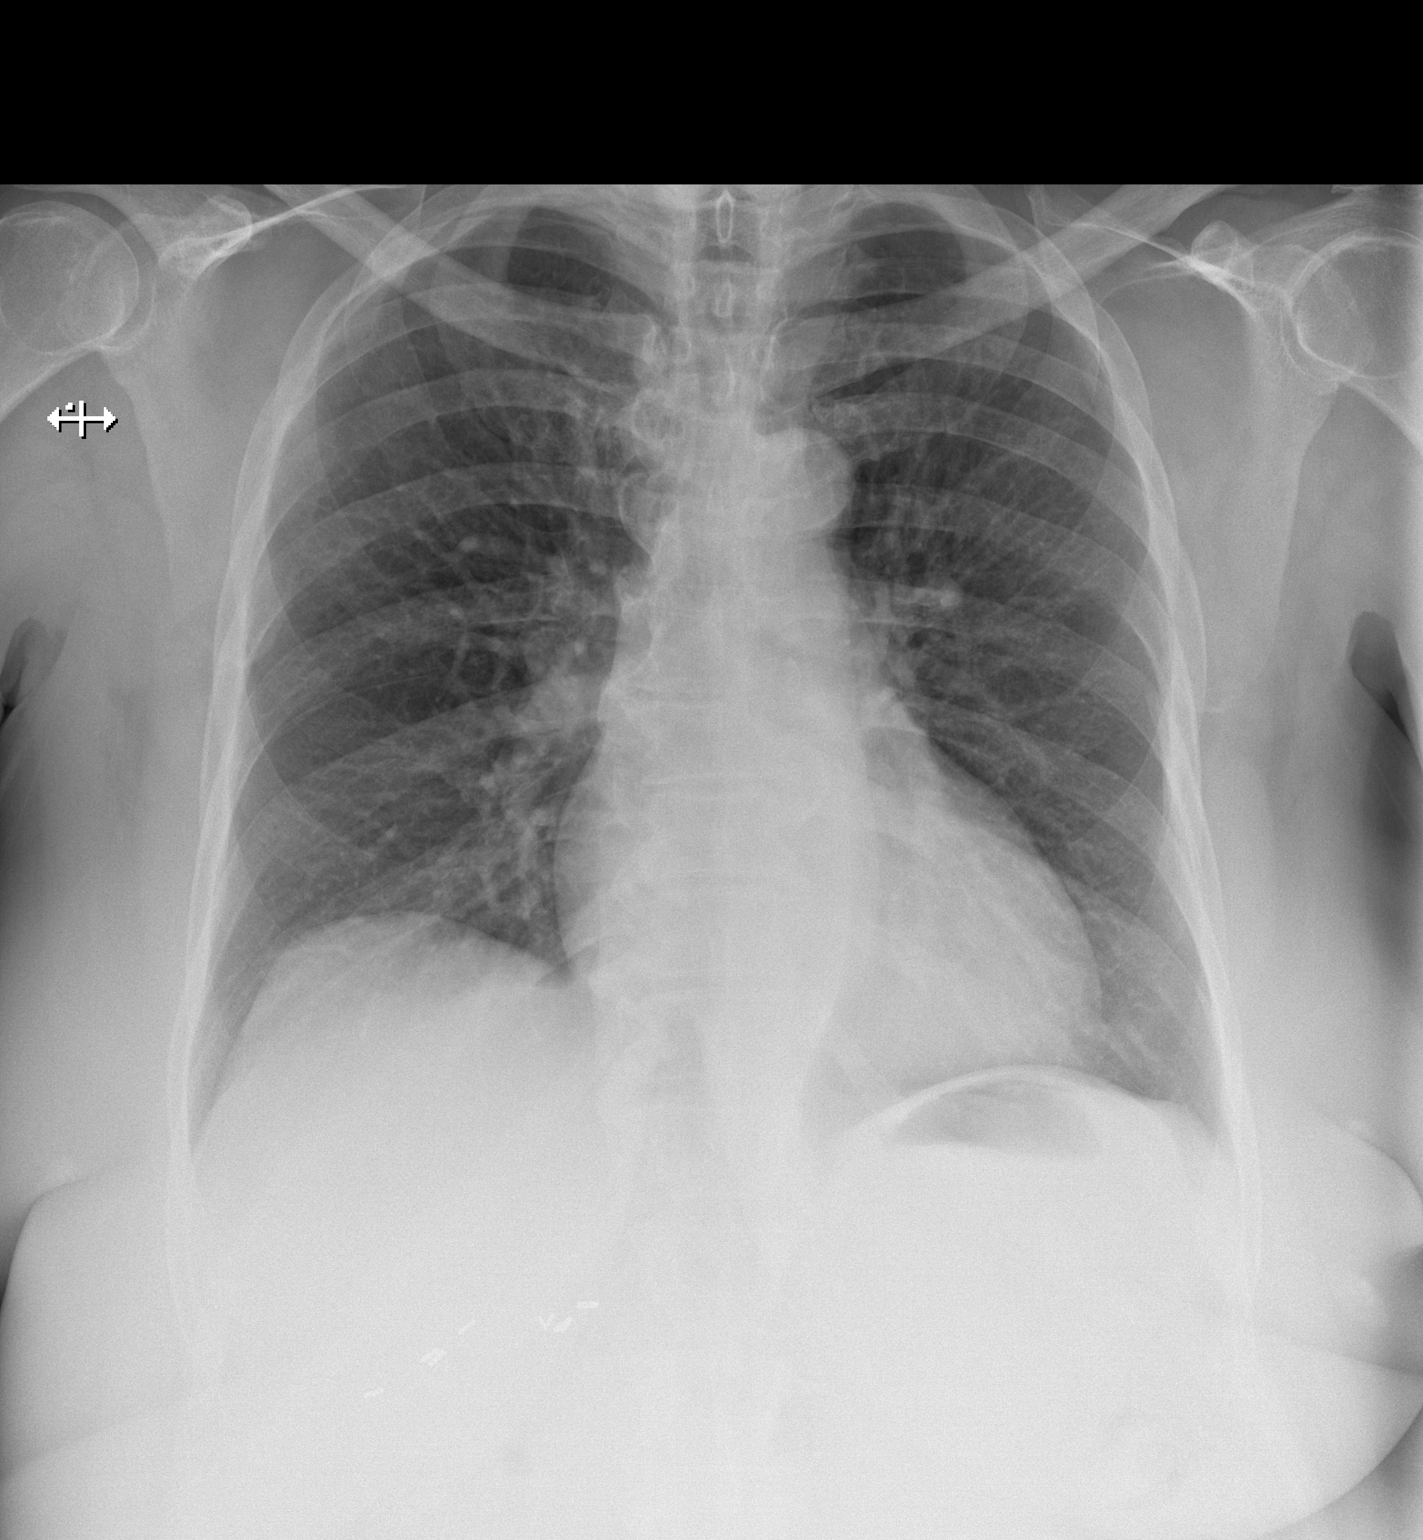

[w chest lat]
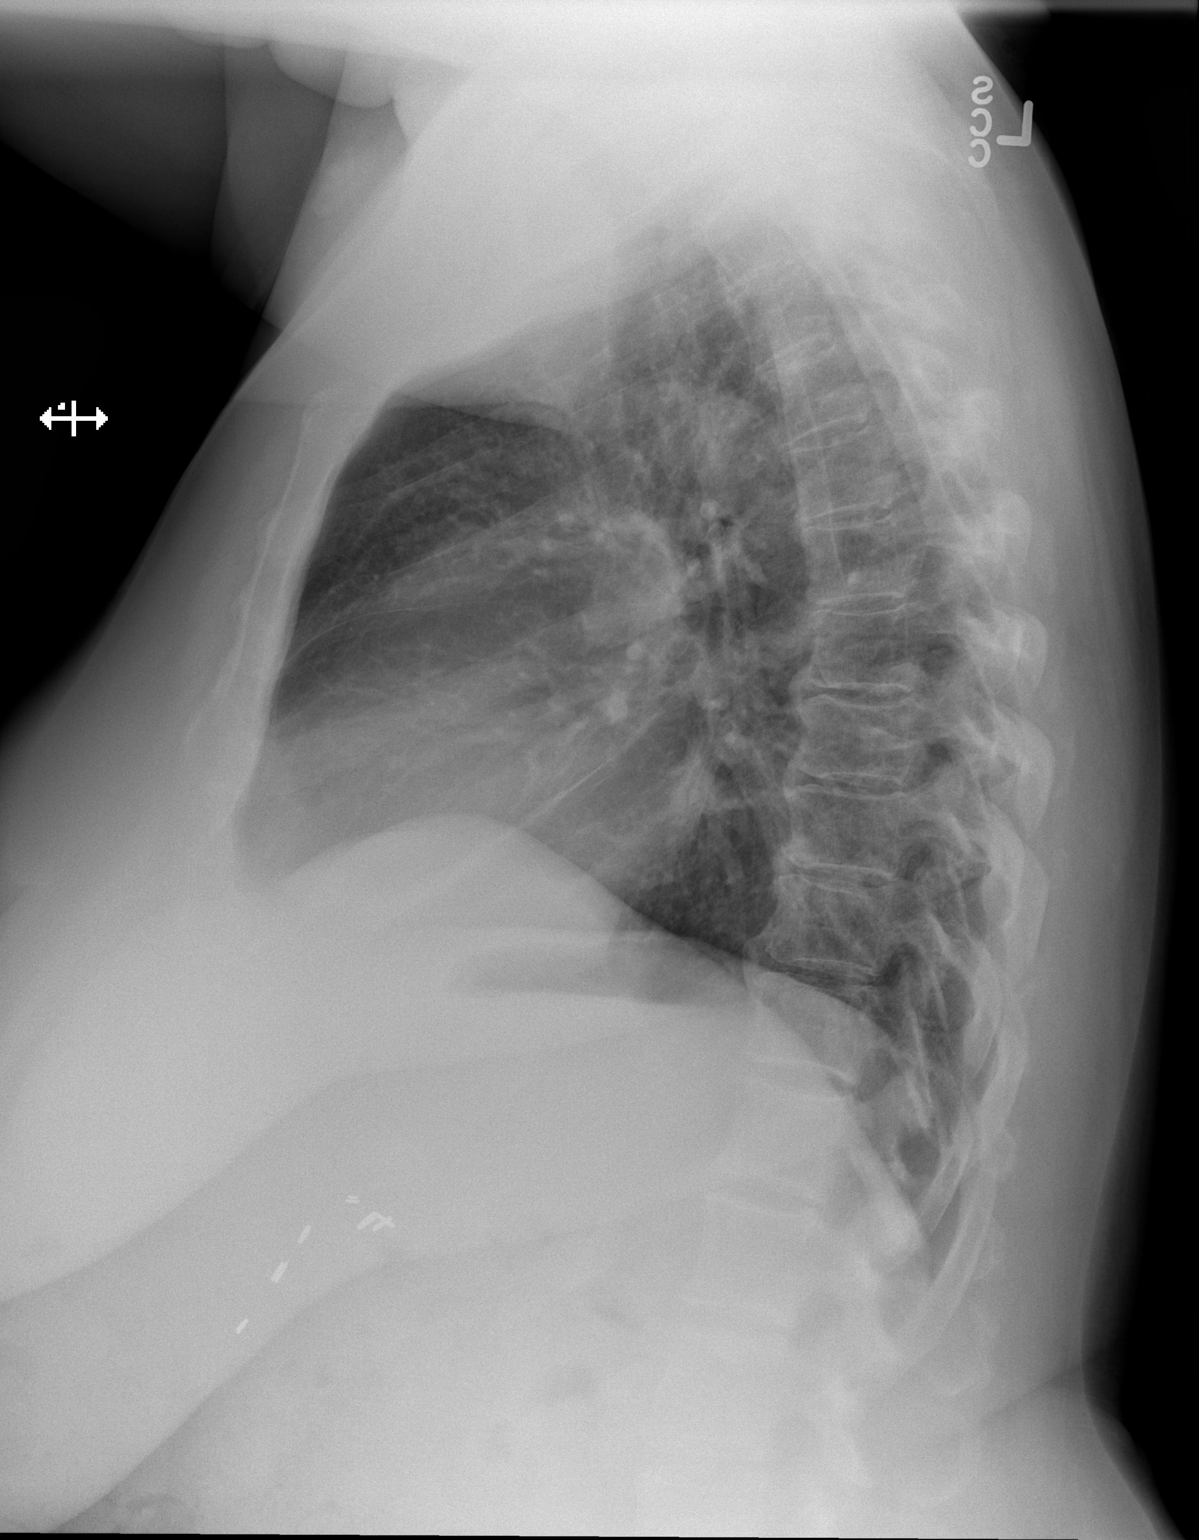

[2 of 2 positions shown; findings below may reference images not displayed]

FINDINGS: Lung volumes are normal. No consolidative airspace disease. No
pleural effusions. No pneumothorax. No pulmonary nodule or mass
noted. Pulmonary vasculature and the cardiomediastinal silhouette
are within normal limits. Aortic atherosclerosis. Surgical clips
project over the right upper quadrant of the abdomen, potentially
from prior cholecystectomy.
IMPRESSION: 1.  No radiographic evidence of acute cardiopulmonary disease.
2. Aortic atherosclerosis.

## 2018-12-06 ENCOUNTER — Other Ambulatory Visit: Payer: Self-pay

## 2018-12-06 ENCOUNTER — Ambulatory Visit (INDEPENDENT_AMBULATORY_CARE_PROVIDER_SITE_OTHER): Payer: BLUE CROSS/BLUE SHIELD | Admitting: Neurology

## 2018-12-06 ENCOUNTER — Encounter: Payer: Self-pay | Admitting: Neurology

## 2018-12-06 VITALS — BP 113/69 | HR 79 | Ht 64.5 in | Wt 214.0 lb

## 2018-12-06 DIAGNOSIS — R202 Paresthesia of skin: Secondary | ICD-10-CM

## 2018-12-06 NOTE — Progress Notes (Signed)
[facility] Clinic Note - Initial Visit   Date: 12/06/18  Karen Ferguson MRN: [id number] DOB: 08/22/1955   Dear Dr. Criss Rosales:  Thank you for your kind referral of Karen Ferguson for consultation of bilateral feet pain. Although her history is well known to you, please allow [place] to reiterate it for the purpose of our medical record. The patient was accompanied to the clinic by self.   History of Present Illness: Karen Ferguson is a 63 y.o. right-handed female with depression, hypertension, and asthma presenting for evaluation of bilateral feet numbness.   Starting around early 2000s, she began having numbness in the feet which is constant.  She occasionally has tingling in the feet.  She complains of imbalance and has fallen once over the past year.  She walks unassisted.  Symptoms are not constant and worse with exertion.  Nothing that helps any of her symptoms.  She also complains of generalized pain in the shoulders, knees, hips, as well as myalgias in the arms and legs.  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  She does not have history of diabetes, alcohol abuse, family history of neuropathy.  Out-side paper records, electronic medical record, and images have been reviewed where available and summarized as:  CT cervical spine wo contrast 12/06/2016:  Negative  Lab Results  Component Value Date   OEUMPNTI14 431 07/17/2011   Lab Results  Component Value Date   TSH 0.981 07/17/2011   Lab Results  Component Value Date   POCTSEDRATE 37 (A) 11/06/2011    Past Medical History:  Diagnosis Date  . Arthritis   . Asthma    Albuterol prn  . Bronchitis   . Degenerative disc disease   . Depression   . History of echocardiogram    Echo 1/18: EF 60-65, no RWMA, Gr 1 DD, PASP 20  . Hypertension   . PVC's (premature ventricular contractions)    Holter 12/17: NSR, occ PAC/PVCs, no AFib; one 4 beat run NSVT    Past Surgical History:  Procedure  Laterality Date  . ABDOMINAL HYSTERECTOMY  1988   for fibroid tumors  . [place]   rt  . CHOLECYSTECTOMY  1989  . [place]  . KNEE ARTHROSCOPY Right   . TENDON REPAIR Right 03/03/2013   Procedure: RIGHT DEBRIDEMENT AND TENOLYSIS OF PERONEOUS LONGOUS AND BREVIS TENDONS ;  Surgeon: Wylene Simmer, MD;  Location: [facility];  Service: Orthopedics;  Laterality: Right;     Medications:  Outpatient Encounter Medications as of 12/06/2018  Medication Sig  . amLODipine (NORVASC) 5 MG tablet Take 1 tablet (5 mg total) by mouth daily.  . methocarbamol (ROBAXIN) 500 MG tablet Take 1 tablet (500 mg total) by mouth 2 (two) times daily.  Marland Kitchen REXULTI 0.5 MG TABS Take 0.5 mg by mouth daily.  . valsartan-hydrochlorothiazide (DIOVAN-HCT) 80-12.5 MG tablet daily.  . [DISCONTINUED] albuterol (PROVENTIL HFA;VENTOLIN HFA) 108 (90 Base) MCG/ACT inhaler Inhale 2 puffs into the lungs every 6 (six) hours as needed for wheezing or shortness of breath.   No facility-administered encounter medications on file as of 12/06/2018.     Allergies:  Allergies  Allergen Reactions  . Shellfish Allergy Hives    Family History: Family History  Problem Relation Age of Onset  . Arthritis Mother   . Hypertension Mother   . Diabetes Mother   . Kidney disease Mother   . Arrhythmia Mother  AFib  . Arthritis Sister   . Heart disease Sister   . Hypertension Brother   . Heart disease Maternal Aunt   . Heart failure Maternal Aunt   . Heart disease Maternal Uncle   . Cancer Maternal Uncle   . Heart disease Maternal Grandfather   . Heart attack Maternal Grandfather 68       MI    Social History: Social History   Tobacco Use  . Smoking status: Never Smoker  . Smokeless tobacco: Never Used  Substance Use Topics  . Alcohol use: Yes    Comment: occasional  . Drug use: No   Social History   Social History Narrative   Works at [employer] in [employer].     Single   1 daughter - passed away at age 54   Lives with her 2 grandchildren.    Review of Systems:  CONSTITUTIONAL: No fevers, chills, night sweats, or weight loss.   EYES: No visual changes or eye pain ENT: No hearing changes.  No history of nose bleeds.   RESPIRATORY: No cough, wheezing and shortness of breath.   CARDIOVASCULAR: Negative for chest pain, and palpitations.   GI: Negative for abdominal discomfort, blood in stools or black stools.  No recent change in bowel habits.   GU:  No history of incontinence.   MUSCLOSKELETAL: +history of joint pain or swelling.  +myalgias.   SKIN: Negative for lesions, rash, and itching.   HEMATOLOGY/ONCOLOGY: Negative for prolonged bleeding, bruising easily, and swollen nodes.  No history of cancer.   ENDOCRINE: Negative for cold or heat intolerance, polydipsia or goiter.   PSYCH:  No depression or anxiety symptoms.   NEURO: As Above.   Vital Signs:  BP 113/69   Pulse 79   Ht 5' 4.5" (1.638 m)   Wt 214 lb (97.1 kg)   SpO2 95%   BMI 36.17 kg/m    General Medical Exam:   General:  Well appearing, comfortable.   Eyes/ENT: see cranial nerve examination.   Neck:   No carotid bruits. Respiratory:  Clear to auscultation, good air entry bilaterally.   Cardiac:  Regular rate and rhythm, no murmur.   Extremities:  No deformities, edema, or skin discoloration.  Skin:  No rashes or lesions.  Neurological Exam: MENTAL STATUS including orientation to time, place, person, recent and remote memory, attention span and concentration, language, and fund of knowledge is normal.  Speech is not dysarthric.  CRANIAL NERVES: II:  No visual field defects.  Unremarkable fundi.   III-IV-VI: Pupils equal round and reactive to light.  Normal conjugate, extra-ocular eye movements in all directions of gaze.  No nystagmus.  No ptosis.   V:  Normal facial sensation.    VII:  Normal facial symmetry and movements.   VIII:  Normal hearing and  vestibular function.   IX-X:  Normal palatal movement.   XI:  Normal shoulder shrug and head rotation.   XII:  Normal tongue strength and range of motion, no deviation or fasciculation.  MOTOR: Giveaway weakness throughout, all muscle groups are antigravity and able to resist.  No atrophy, fasciculations or abnormal movements.  No pronator drift.    MSRs: Reflexes are 1+/4 throughout.  Plantars are downgoing.  Reflex testing is limited by pain  SENSORY:  Normal and symmetric perception of light touch, pinprick, vibration, and proprioception.  Romberg's sign absent.   COORDINATION/GAIT: Normal finger-to- nose-finger and heel-to-shin.  Intact rapid alternating movements bilaterally.  Gait  appears antalgic, slow, unassisted and stable.    IMPRESSION: Bilateral feet numbness, possible neuropathy.  It is difficult to obtain accurate examination due to patient's myalgias and give-way weakness. She does not have risk factors for neuropathy such as diabetes, thyroid disease, alcohol use, or chemotherapy exposure.  I will obtain NCS/EMG of the legs to better localize her symptoms.  If there is findings to support neuropathy, additional labs will be checked.   She also complains of polyarthralgias and myalgias, ?fibromylagia.  She will follow-up with her PCP.    Thank you for allowing me to participate in patient's care.  If I can answer any additional questions, I would be pleased to do so.    Sincerely,    Tahni Porchia K. Posey Pronto, DO

## 2018-12-06 NOTE — Patient Instructions (Addendum)

## 2018-12-10 MED FILL — VENTOLIN HFA 90 MCG INHALER: 108 (90 BAS | 25 days supply | Qty: 18 | Fill #0

## 2019-01-04 ENCOUNTER — Encounter: Payer: BLUE CROSS/BLUE SHIELD | Admitting: Neurology

## 2019-01-06 DIAGNOSIS — M2550 Pain in unspecified joint: Secondary | ICD-10-CM | POA: Diagnosis not present

## 2019-01-06 DIAGNOSIS — M545 Low back pain: Secondary | ICD-10-CM | POA: Diagnosis not present

## 2019-01-06 DIAGNOSIS — M5380 Other specified dorsopathies, site unspecified: Secondary | ICD-10-CM | POA: Diagnosis not present

## 2019-01-06 DIAGNOSIS — M1990 Unspecified osteoarthritis, unspecified site: Secondary | ICD-10-CM | POA: Diagnosis not present

## 2019-01-06 MED FILL — METHOCARBAMOL 500 MG TABS: 500 | 10 days supply | Qty: 60 | Fill #0

## 2019-01-06 MED FILL — DICLOFENAC SODIUM 1 % GEL: 1 | 19 days supply | Qty: 300 | Fill #0

## 2019-01-27 MED FILL — PEG-3350 SOLUTION: 420 | 1 days supply | Qty: 4000 | Fill #0

## 2019-02-10 ENCOUNTER — Other Ambulatory Visit: Payer: Self-pay

## 2019-02-10 ENCOUNTER — Ambulatory Visit (INDEPENDENT_AMBULATORY_CARE_PROVIDER_SITE_OTHER): Payer: Medicare Other | Admitting: Neurology

## 2019-02-10 DIAGNOSIS — M5417 Radiculopathy, lumbosacral region: Secondary | ICD-10-CM

## 2019-02-10 DIAGNOSIS — R202 Paresthesia of skin: Secondary | ICD-10-CM | POA: Diagnosis not present

## 2019-02-10 NOTE — Procedures (Signed)
[facility]  [place], [place]  [place], [place] [address] Tel: [phone] Fax:  [phone] Test Date:  02/10/2019  Patient: Karen Ferguson DOB: [birth year] Physician: Narda Amber, DO  Sex: Female Height: 5\' 5"  Ref Phys: Narda Amber, DO  ID#: [id number] Temp: 32.0C Technician:    Patient Complaints: This is a 63 year old female referred for evaluation of episodic numbness of the legs.  NCV & EMG Findings: Extensive electrodiagnostic testing of the right lower extremity and additional studies of the left shows:  1. Bilateral sural and superficial peroneal sensory responses are within normal limits. 2. Bilateral peroneal and left tibial motor responses are within normal limits.  Right tibial motor response shows reduced amplitude (3.3 mV).  3. Chronic motor axonal loss changes are isolated to the right gastrocnemius and biceps femoris short head muscles, without accompanied active denervation.  These findings are not present on the left.   Impression: 1. Chronic S1 radiculopathy affecting the right lower extremity, very mild. 2. There is no evidence of a sensorimotor polyneuropathy affecting the lower extremities.   ___________________________ Narda Amber, DO    Nerve Conduction Studies Anti Sensory Summary Table   Site NR Peak (ms) Norm Peak (ms) P-T Amp (V) Norm P-T Amp  Left Sup Peroneal Anti Sensory (Ant Lat Mall)  32C  12 cm    3.0 <4.6 7.7 >3  Right Sup Peroneal Anti Sensory (Ant Lat Mall)  32C  12 cm    3.0 <4.6 5.3 >3  Left Sural Anti Sensory (Lat Mall)  32C  Calf    2.8 <4.6 11.5 >3  Right Sural Anti Sensory (Lat Mall)  32C  Calf    2.6 <4.6 12.0 >3   Motor Summary Table   Site NR Onset (ms) Norm Onset (ms) O-P Amp (mV) Norm O-P Amp Site1 Site2 Delta-0 (ms) Dist (cm) Vel (m/s) Norm Vel (m/s)  Left Peroneal Motor (Ext Dig Brev)  32C  Ankle    3.4 <6.0 4.4 >2.5 B Fib Ankle 8.2 38.0 46 >40  B Fib    11.6  4.0  Poplt B Fib 1.1 7.0 64 >40   Poplt    12.7  4.0         Right Peroneal Motor (Ext Dig Brev)  32C  Ankle    3.2 <6.0 3.7 >2.5 B Fib Ankle 8.1 35.0 43 >40  B Fib    11.3  3.0  Poplt B Fib 1.4 8.0 57 >40  Poplt    12.7  3.0         Left Tibial Motor (Abd Hall Brev)  32C  Ankle    4.9 <6.0 4.2 >4 Knee Ankle 9.1 39.0 43 >40  Knee    14.0  3.3         Right Tibial Motor (Abd Hall Brev)  32C  Ankle    4.8 <6.0 3.3 >4 Knee Ankle 7.5 37.0 49 >40  Knee    12.3  1.7          H Reflex Studies   NR H-Lat (ms) Lat Norm (ms) L-R H-Lat (ms)  Left Tibial (Gastroc)  32C     33.88 <35 3.81  Right Tibial (Gastroc)  32C     30.07 <35 3.81   EMG   Side Muscle Ins Act Fibs Psw Fasc Number Recrt Dur Dur. Amp Amp. Poly Poly. Comment  Right AntTibialis Nml Nml Nml Nml Nml Nml Nml Nml Nml Nml Nml Nml N/A  Right Gastroc Nml  Nml Nml Nml 1- Rapid Few 1+ Few 1+ Nml Nml N/A  Right Flex Dig Long Nml Nml Nml Nml Nml Nml Nml Nml Nml Nml Nml Nml N/A  Right RectFemoris Nml Nml Nml Nml Nml Nml Nml Nml Nml Nml Nml Nml N/A  Right GluteusMed Nml Nml Nml Nml Nml Nml Nml Nml Nml Nml Nml Nml N/A  Right BicepsFemS Nml Nml Nml Nml 1- Rapid Few 1+ Few 1+ Nml Nml N/A  Left BicepsFemS Nml Nml Nml Nml Nml Nml Nml Nml Nml Nml Nml Nml N/A  Left AntTibialis Nml Nml Nml Nml Nml Nml Nml Nml Nml Nml Nml Nml N/A  Left Gastroc Nml Nml Nml Nml Nml Nml Nml Nml Nml Nml Nml Nml N/A  Left Flex Dig Long Nml Nml Nml Nml Nml Nml Nml Nml Nml Nml Nml Nml N/A  Left RectFemoris Nml Nml Nml Nml Nml Nml Nml Nml Nml Nml Nml Nml N/A  Left GluteusMed Nml Nml Nml Nml Nml Nml Nml Nml Nml Nml Nml Nml N/A      Waveforms:

## 2019-02-11 ENCOUNTER — Other Ambulatory Visit: Payer: Self-pay

## 2019-02-11 DIAGNOSIS — M5417 Radiculopathy, lumbosacral region: Secondary | ICD-10-CM

## 2019-02-16 MED FILL — PANTOPRAZOLE SOD DR 40 MG T: 40 | 30 days supply | Qty: 30 | Fill #0

## 2019-02-17 ENCOUNTER — Telehealth: Payer: Self-pay

## 2019-02-17 NOTE — Telephone Encounter (Signed)
Per patient [facility] was denied at [facility] for [facility], Going to send her to [facility], orders faxed with notes and insurance card. FYI

## 2019-02-24 MED FILL — PANTOPRAZOLE SOD DR 20 MG T: 20 | 30 days supply | Qty: 30 | Fill #0

## 2019-03-02 MED FILL — VALSARTAN-HCTZ 80-12.5 MG T: 80-12.5 | 30 days supply | Qty: 30 | Fill #1

## 2019-03-14 MED FILL — PANTOPRAZOLE SOD DR 40 MG T: 40 | 30 days supply | Qty: 30 | Fill #0

## 2019-03-21 NOTE — Progress Notes (Signed)
[place], [place] - [address] [facility] [address] [place] [address] Phone: [phone] Fax: [place], [name] [address] Phone: [phone] Fax: [phone]  [place] ([place]), [place] - [address] [place] ([place]) [place] [address] Phone: [phone] Fax: [phone]    Your procedure is scheduled on Friday, October 30th  Report to [facility] Main Entrance "A" at 8:25 A.M., and check in at the Admitting office.  Call this number if you have problems the morning of surgery:  [phone]  Call [phone] if you have any questions prior to your surgery date Monday-Friday 8am-4pm   Remember:  Do not eat or drink after midnight the night before your surgery   Take these medicines the morning of surgery with A SIP OF WATER  pantoprazole (PROTONIX)  If needed - albuterol (VENTOLIN HFA)/ inhaler - bring with you the day of surgery, methocarbamol (ROBAXIN)   As of today, STOP taking any Aspirin (unless otherwise instructed by your surgeon), Aleve, Naproxen, Ibuprofen, Motrin, Advil, Goody's, BC's, all herbal medications, fish oil, and all vitamins.   The Morning of Surgery  Do not wear jewelry, make-up or nail polish.  Do not wear lotions, powders, perfumes, or deodorant  Do not shave 48 hours prior to surgery.  Do not bring valuables to the hospital.  [facility] is not responsible for any belongings or valuables.  If you are a smoker, DO NOT Smoke 24 hours prior to surgery IF you wear a CPAP at night please bring your mask, tubing, and machine the morning of surgery   Remember that you must have someone to transport you home after your surgery, and remain with you for 24 hours if you are discharged the same day.  Contacts, glasses, hearing aids, dentures or bridgework may not be  worn into surgery.   Leave your suitcase in the car.  After surgery it may be brought to your room.  For patients admitted to the hospital, discharge time will be determined by your treatment team.  Patients discharged the day of surgery will not be allowed to drive home.   Special instructions:   [facility]- Preparing For Surgery  Before surgery, you can play an important role. Because skin is not sterile, your skin needs to be as free of germs as possible. You can reduce the number of germs on your skin by washing with CHG (chlorahexidine gluconate) Soap before surgery.  CHG is an antiseptic cleaner which kills germs and bonds with the skin to continue killing germs even after washing.    Oral Hygiene is also important to reduce your risk of infection.  Remember - BRUSH YOUR TEETH THE MORNING OF SURGERY WITH YOUR REGULAR TOOTHPASTE  Please do not use if you have an allergy to CHG or antibacterial soaps. If your skin becomes reddened/irritated stop using the CHG.  Do not shave (including legs and underarms) for at least 48 hours prior to first CHG shower. It is OK to shave your face.  Please follow these instructions carefully.   1. Shower the NIGHT BEFORE SURGERY and the MORNING OF SURGERY with CHG Soap.   2. If you chose to wash your hair, wash your hair first as usual with your normal shampoo.  3. After you shampoo, rinse your hair and body thoroughly to remove the shampoo.  4. Use  CHG as you would any other liquid soap. You can apply CHG directly to the skin and wash gently with a scrungie or a clean washcloth.   5. Apply the CHG Soap to your body ONLY FROM THE NECK DOWN.  Do not use on open wounds or open sores. Avoid contact with your eyes, ears, mouth and genitals (private parts). Wash Face and genitals (private parts)  with your normal soap.   6. Wash thoroughly, paying special attention to the area where your surgery will be performed.  7. Thoroughly rinse your body with  warm water from the neck down.  8. DO NOT shower/wash with your normal soap after using and rinsing off the CHG Soap.  9. Pat yourself dry with a CLEAN TOWEL.  10. Wear CLEAN PAJAMAS to bed the night before surgery, wear comfortable clothes the morning of surgery  11. Place CLEAN SHEETS on your bed the night of your first shower and DO NOT SLEEP WITH PETS.  Day of Surgery: Do not apply any deodorants/lotions. Please shower the morning of surgery with the CHG soap  Please wear clean clothes to the hospital/surgery center.   Remember to brush your teeth WITH YOUR REGULAR TOOTHPASTE.  Please read over the following fact sheets that you were given.

## 2019-03-22 ENCOUNTER — Other Ambulatory Visit (HOSPITAL_COMMUNITY)
Admission: RE | Admit: 2019-03-22 | Discharge: 2019-03-22 | Disposition: A | Discharge status: Home / Self Care | Payer: Medicare Other | Source: Ambulatory Visit | Attending: Oral Surgery | Admitting: Oral Surgery

## 2019-03-22 ENCOUNTER — Encounter (HOSPITAL_COMMUNITY)
Admission: RE | Admit: 2019-03-22 | Discharge: 2019-03-22 | Disposition: A | Discharge status: Home / Self Care | Payer: Medicare Other | Source: Ambulatory Visit | Attending: Psychiatry | Admitting: Psychiatry

## 2019-03-22 ENCOUNTER — Other Ambulatory Visit: Payer: Self-pay

## 2019-03-22 ENCOUNTER — Encounter (HOSPITAL_COMMUNITY): Payer: Self-pay

## 2019-03-22 DIAGNOSIS — Z20828 Contact with and (suspected) exposure to other viral communicable diseases: Secondary | ICD-10-CM | POA: Insufficient documentation

## 2019-03-22 DIAGNOSIS — Z01812 Encounter for preprocedural laboratory examination: Secondary | ICD-10-CM | POA: Diagnosis present

## 2019-03-22 DIAGNOSIS — Z01818 Encounter for other preprocedural examination: Secondary | ICD-10-CM | POA: Diagnosis not present

## 2019-03-22 HISTORY — DX: Type 2 diabetes mellitus without complications: E11.9

## 2019-03-22 LAB — CBC
HCT: 38.5 % (ref 36.0–46.0)
Hemoglobin: 12.4 g/dL (ref 12.0–15.0)
MCH: 31.2 pg (ref 26.0–34.0)
MCHC: 32.2 g/dL (ref 30.0–36.0)
MCV: 96.7 fL (ref 80.0–100.0)
Platelets: 281 10*3/uL (ref 150–400)
RBC: 3.98 MIL/uL (ref 3.87–5.11)
RDW: 12.5 % (ref 11.5–15.5)
WBC: 6.2 10*3/uL (ref 4.0–10.5)
nRBC: 0 % (ref 0.0–0.2)

## 2019-03-22 LAB — BASIC METABOLIC PANEL
Anion gap: 7 (ref 5–15)
BUN: 9 mg/dL (ref 8–23)
CO2: 31 mmol/L (ref 22–32)
Calcium: 9.5 mg/dL (ref 8.9–10.3)
Chloride: 102 mmol/L (ref 98–111)
Creatinine, Ser: 0.68 mg/dL (ref 0.44–1.00)
GFR calc Af Amer: >60 mL/min (ref >60)
GFR calc non Af Amer: >60 mL/min (ref >60)
Glucose, Bld: 91 mg/dL (ref 70–99)
Potassium: 3.9 mmol/L (ref 3.5–5.1)
Sodium: 140 mmol/L (ref 135–145)

## 2019-03-22 LAB — HEMOGLOBIN A1C
Hgb A1c MFr Bld: 5.5 % (ref 4.8–5.6)
Mean Plasma Glucose: 111.15 mg/dL

## 2019-03-22 LAB — GLUCOSE, CAPILLARY: Glucose-Capillary: 81 mg/dL (ref 70–99)

## 2019-03-22 NOTE — Progress Notes (Addendum)
Covid test scheduled for today. No recent h/o travel. No associating Covid symptoms. No recent exposure to Covid positive patients.  PCP - Dr Osborne Casco, [place]  Cardiologist - denies  Chest x-ray - NA  EKG -DOS. Pt left before we could get her EKG  Stress Test - denies  ECHO -05-29-16  Cardiac Cath - denies  AICD-denies PM-denies LOOP-denies  Sleep Study - denies CPAP - NA  LABS-CBC,BMP,A1C  ASA-denies  ERAS-NA  HA1C-Today. Pt states she was borderline diabetic in the past. Fasting Blood Sugar - 81 Lo/Hi--unaware  Checks Blood Sugar _0____ times a day  Anesthesia-N  Pt denies having chest pain, sob, or fever at this time. All instructions explained to the pt, with a verbal understanding of the material. Pt agrees to go over the instructions while at home for a better understanding. Pt also instructed to self quarantine after being tested for COVID-19. The opportunity to ask questions was provided.

## 2019-03-24 LAB — NOVEL CORONAVIRUS, NAA (HOSP ORDER, SEND-OUT TO REF LAB; TAT 18-24 HRS)

## 2019-03-25 ENCOUNTER — Encounter (HOSPITAL_COMMUNITY): Payer: Self-pay | Admitting: *Deleted

## 2019-03-25 ENCOUNTER — Ambulatory Visit (HOSPITAL_COMMUNITY)
Admission: RE | Admit: 2019-03-25 | Discharge: 2019-03-25 | Disposition: A | Discharge status: Home / Self Care | Payer: Medicare Other | Attending: Oral Surgery | Admitting: Oral Surgery

## 2019-03-25 ENCOUNTER — Ambulatory Visit (HOSPITAL_COMMUNITY): Payer: Medicare Other | Admitting: Certified Registered Nurse Anesthetist

## 2019-03-25 ENCOUNTER — Other Ambulatory Visit: Payer: Self-pay

## 2019-03-25 ENCOUNTER — Encounter (HOSPITAL_COMMUNITY)
Admission: RE | Disposition: A | Discharge status: Home / Self Care | Payer: Self-pay | Source: Home / Self Care | Attending: Oral Surgery

## 2019-03-25 DIAGNOSIS — E119 Type 2 diabetes mellitus without complications: Secondary | ICD-10-CM | POA: Insufficient documentation

## 2019-03-25 DIAGNOSIS — K0530 Chronic periodontitis, unspecified: Secondary | ICD-10-CM | POA: Insufficient documentation

## 2019-03-25 DIAGNOSIS — I10 Essential (primary) hypertension: Secondary | ICD-10-CM | POA: Insufficient documentation

## 2019-03-25 DIAGNOSIS — K029 Dental caries, unspecified: Secondary | ICD-10-CM | POA: Insufficient documentation

## 2019-03-25 DIAGNOSIS — M1990 Unspecified osteoarthritis, unspecified site: Secondary | ICD-10-CM | POA: Diagnosis not present

## 2019-03-25 DIAGNOSIS — K219 Gastro-esophageal reflux disease without esophagitis: Secondary | ICD-10-CM | POA: Insufficient documentation

## 2019-03-25 DIAGNOSIS — J45909 Unspecified asthma, uncomplicated: Secondary | ICD-10-CM | POA: Insufficient documentation

## 2019-03-25 DIAGNOSIS — Z20828 Contact with and (suspected) exposure to other viral communicable diseases: Secondary | ICD-10-CM | POA: Diagnosis not present

## 2019-03-25 HISTORY — PX: TOOTH EXTRACTION: SHX859

## 2019-03-25 LAB — GLUCOSE, CAPILLARY
Glucose-Capillary: 82 mg/dL (ref 70–99)
Glucose-Capillary: 105 mg/dL — ABNORMAL HIGH (ref 70–99)

## 2019-03-25 LAB — SARS CORONAVIRUS 2 BY RT PCR (HOSPITAL ORDER, PERFORMED IN ~~LOC~~ HOSPITAL LAB): SARS Coronavirus 2: NEGATIVE

## 2019-03-25 SURGERY — DENTAL RESTORATION/EXTRACTIONS
Anesthesia: General | Site: Mouth

## 2019-03-25 MED ORDER — ALBUTEROL SULFATE HFA 108 (90 BASE) MCG/ACT IN AERS
INHALATION_SPRAY | RESPIRATORY_TRACT | Status: DC | PRN
  Administered 2019-03-25: 2 via RESPIRATORY_TRACT

## 2019-03-25 MED ORDER — LIDOCAINE-EPINEPHRINE 2 %-1:100000 IJ SOLN
INTRAMUSCULAR | Status: AC
  Filled 2019-03-25: qty 1

## 2019-03-25 MED ORDER — 0.9 % SODIUM CHLORIDE (POUR BTL) OPTIME
TOPICAL | Status: DC | PRN
  Administered 2019-03-25: 1000 mL

## 2019-03-25 MED ORDER — OXYMETAZOLINE HCL 0.05 % NA SOLN
NASAL | Status: DC | PRN
  Administered 2019-03-25: 2 via NASAL

## 2019-03-25 MED ORDER — DEXAMETHASONE SODIUM PHOSPHATE 10 MG/ML IJ SOLN
INTRAMUSCULAR | Status: DC | PRN
  Administered 2019-03-25: 10 mg via INTRAVENOUS

## 2019-03-25 MED ORDER — OXYCODONE-ACETAMINOPHEN 5-325 MG PO TABS: 1.0000 | ORAL_TABLET | ORAL | 0 refills | Status: DC | PRN

## 2019-03-25 MED ORDER — FENTANYL CITRATE (PF) 250 MCG/5ML IJ SOLN
INTRAMUSCULAR | Status: AC
  Filled 2019-03-25: qty 5

## 2019-03-25 MED ORDER — OXYMETAZOLINE HCL 0.05 % NA SOLN
NASAL | Status: AC
  Filled 2019-03-25: qty 30

## 2019-03-25 MED ORDER — AMOXICILLIN 500 MG PO CAPS: 500.0000 mg | ORAL_CAPSULE | Freq: Three times a day (TID) | ORAL | 0 refills | Status: DC

## 2019-03-25 MED ORDER — MIDAZOLAM HCL 2 MG/2ML IJ SOLN
INTRAMUSCULAR | Status: DC | PRN
  Administered 2019-03-25: 2 mg via INTRAVENOUS

## 2019-03-25 MED ORDER — ONDANSETRON HCL 4 MG/2ML IJ SOLN
INTRAMUSCULAR | Status: DC | PRN
  Administered 2019-03-25: 4 mg via INTRAVENOUS

## 2019-03-25 MED ORDER — LIDOCAINE 2% (20 MG/ML) 5 ML SYRINGE
INTRAMUSCULAR | Status: DC | PRN
  Administered 2019-03-25: 100 mg via INTRAVENOUS

## 2019-03-25 MED ORDER — LIDOCAINE-EPINEPHRINE 2 %-1:100000 IJ SOLN
INTRAMUSCULAR | Status: DC | PRN
  Administered 2019-03-25: 16 mL

## 2019-03-25 MED ORDER — CEFAZOLIN SODIUM-DEXTROSE 2-4 GM/100ML-% IV SOLN
2.0000 g | INTRAVENOUS | Status: AC
  Administered 2019-03-25: 11:00:00 2 g via INTRAVENOUS
  Filled 2019-03-25: qty 100

## 2019-03-25 MED ORDER — MIDAZOLAM HCL 2 MG/2ML IJ SOLN
INTRAMUSCULAR | Status: AC
  Filled 2019-03-25: qty 2

## 2019-03-25 MED ORDER — FENTANYL CITRATE (PF) 250 MCG/5ML IJ SOLN
INTRAMUSCULAR | Status: DC | PRN
  Administered 2019-03-25: 100 ug via INTRAVENOUS

## 2019-03-25 MED ORDER — PROPOFOL 10 MG/ML IV BOLUS
INTRAVENOUS | Status: DC | PRN
  Administered 2019-03-25: 30 mg via INTRAVENOUS
  Administered 2019-03-25: 140 mg via INTRAVENOUS

## 2019-03-25 MED ORDER — LACTATED RINGERS IV SOLN
INTRAVENOUS | Status: DC
  Administered 2019-03-25: 10:00:00 via INTRAVENOUS

## 2019-03-25 MED ORDER — SODIUM CHLORIDE 0.9 % IV SOLN
INTRAVENOUS | Status: AC | PRN
  Administered 2019-03-25: 1000 mL

## 2019-03-25 MED ORDER — SUCCINYLCHOLINE CHLORIDE 20 MG/ML IJ SOLN
INTRAMUSCULAR | Status: DC | PRN
  Administered 2019-03-25: 120 mg via INTRAVENOUS

## 2019-03-25 MED FILL — OXYCODONE-ACETAMINOPHEN 5-3: 5-325 | 5 days supply | Qty: 30 | Fill #0

## 2019-03-25 MED FILL — AMOXICILLIN 500 MG CAPSULE: 500 | 7 days supply | Qty: 21 | Fill #0

## 2019-03-25 SURGICAL SUPPLY — 37 items
BLADE SURG 15 STRL LF DISP TIS (BLADE) ×1 IMPLANT
BLADE SURG 15 STRL SS (BLADE) ×2
BUR CROSS CUT FISSURE 1.6 (BURR) ×2 IMPLANT
BUR EGG ELITE 4.0 (BURR) ×2 IMPLANT
CANISTER SUCT 3000ML PPV (MISCELLANEOUS) ×2 IMPLANT
COVER SURGICAL LIGHT HANDLE (MISCELLANEOUS) ×2 IMPLANT
COVER WAND RF STERILE (DRAPES) ×2 IMPLANT
DECANTER SPIKE VIAL GLASS SM (MISCELLANEOUS) ×2 IMPLANT
DRAPE U-SHAPE 76X120 STRL (DRAPES) ×2 IMPLANT
GAUZE PACKING FOLDED 2  STR (GAUZE/BANDAGES/DRESSINGS) ×1
GAUZE PACKING FOLDED 2 STR (GAUZE/BANDAGES/DRESSINGS) ×1 IMPLANT
GLOVE BIO SURGEON STRL SZ 6.5 (GLOVE) IMPLANT
GLOVE BIO SURGEON STRL SZ7 (GLOVE) IMPLANT
GLOVE BIO SURGEON STRL SZ7.5 (GLOVE) ×2 IMPLANT
GLOVE BIOGEL PI IND STRL 6.5 (GLOVE) IMPLANT
GLOVE BIOGEL PI IND STRL 7.0 (GLOVE) IMPLANT
GLOVE BIOGEL PI INDICATOR 6.5 (GLOVE)
GLOVE BIOGEL PI INDICATOR 7.0 (GLOVE)
GOWN STRL REUS W/ TWL LRG LVL3 (GOWN DISPOSABLE) ×1 IMPLANT
GOWN STRL REUS W/ TWL XL LVL3 (GOWN DISPOSABLE) ×1 IMPLANT
GOWN STRL REUS W/TWL LRG LVL3 (GOWN DISPOSABLE) ×2
GOWN STRL REUS W/TWL XL LVL3 (GOWN DISPOSABLE) ×2
IV NS 1000ML (IV SOLUTION) ×2
IV NS 1000ML BAXH (IV SOLUTION) ×1 IMPLANT
KIT BASIN OR (CUSTOM PROCEDURE TRAY) ×2 IMPLANT
KIT TURNOVER KIT B (KITS) ×2 IMPLANT
NDL HYPO 25GX1X1/2 BEV (NEEDLE) ×2 IMPLANT
NEEDLE HYPO 25GX1X1/2 BEV (NEEDLE) ×2 IMPLANT
NS IRRIG 1000ML POUR BTL (IV SOLUTION) ×2 IMPLANT
PAD ARMBOARD 7.5X6 YLW CONV (MISCELLANEOUS) ×2 IMPLANT
SLEEVE IRRIGATION ELITE 7 (MISCELLANEOUS) ×2 IMPLANT
SPONGE SURGIFOAM ABS GEL 12-7 (HEMOSTASIS) IMPLANT
SUT CHROMIC 3 0 PS 2 (SUTURE) ×3 IMPLANT
SYR CONTROL 10ML LL (SYRINGE) ×2 IMPLANT
TRAY ENT MC OR (CUSTOM PROCEDURE TRAY) ×2 IMPLANT
TUBING IRRIGATION (MISCELLANEOUS) ×2 IMPLANT
YANKAUER SUCT BULB TIP NO VENT (SUCTIONS) ×2 IMPLANT

## 2019-03-25 NOTE — Op Note (Signed)
Karen Ferguson, STAIRS MEDICAL RECORD [id number] ACCOUNT [id number] DATE OF BIRTH:[birth year] FACILITY: [facility] LOCATION: [facility] PHYSICIAN:Aneisha Skyles M. 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  PROCEDURE:  Extraction of teeth numbers 3, 4, 5, 6, 7, 9, 12, 14, 30, alveoplasty right and left maxilla.  SURGEON:  Diona Browner, DDS  ANESTHESIA:  General, nasal intubation, [place] attending.    DESCRIPTION OF PROCEDURE:  The patient was taken to the operating room and placed on the table in supine position.  General anesthesia was administered intravenously and a nasal endotracheal tube was placed and secured.  The eyes were protected and the  patient was draped for surgery.  A timeout was performed.  The posterior pharynx was suctioned and a throat pack was placed, 2% lidocaine with 1:100,000 epinephrine was infiltrated in the buccal and palatal area of the maxilla adjacent to the teeth to be  removed in a right mandibular inferior alveolar block.  A bite block was then placed in the mouth and then a #15 blade was used to make an incision around tooth numbers 14 and 12.  The periosteum was reflected from around these teeth and extended  proximally and distally approximately 1 cm from the teeth margins along the alveolar crest and then the periosteum was reflected.  Bone was removed and the teeth were removed using a dental forceps.  Root fractures occurred and additional bone was  removed and the sockets until the root tip pick could be used to remove the root tips.  Then, the area was curetted and the periosteum was reflected to expose the alveolar ridge, which was irregular in contour.  An egg-shaped bur and the Stryker  handpiece under irrigation was used to perform alveoplasty followed by bone file.  Then, the area was irrigated and closed with 3-0  chromic.  Then, the bite block and sweetheart retractor were repositioned to the other side of the mouth.  The #15 blade  was used to make an incision around teeth numbers 3, 4, 5, 6, 7 and 9.  The periosteum was reflected from around these teeth and the teeth were elevated with a 301 elevator.  The roots of tooth 3 were removed using rongeurs and then bone was removed  around the palatal root so it could be grasped and removed.  Roots fractured on teeth numbers 4 and 5 upon attempted removal, necessitating use of a Stryker handpiece with a fissure bur to remove bone around the root tips and then the root tips were  removed using a root tip pick and rongeurs.  Teeth 6, 7 and 9 were removed with the dental forceps in a simple fashion.  Then, the periosteum was reflected to expose the alveolar ridge of the right maxilla.  The bony contour was irregular and sharp with  undercuts and so the Stryker handpiece with an egg-shaped bur was used to perform alveoplasty.  Then, the bone file was used and the area was irrigated and closed with 3-0 chromic.  Tooth 30 was then extracted using a #15 blade for a sulcular incision.   The Stryker handpiece with fissure bur was used to section the roots, and the roots were removed using 301 elevator and rongeurs.  Then, the socket was debrided, irrigated and closed with 3-0 chromic.  The oral cavity was then inspected and found to have  good contour,  hemostasis and closure.  The oral cavity was suctioned and a throat pack was removed.  The patient was left in care of anesthesia for extubation and transport to recovery room with plans for discharge home through day surgery.  ESTIMATED BLOOD LOSS:  Minimal.  COMPLICATIONS:  None.  SPECIMENS:  None.  TN/NUANCE  D:03/25/2019 T:03/25/2019 JOB:[id number]

## 2019-03-25 NOTE — Anesthesia Procedure Notes (Signed)
Procedure Name: Intubation Date/Time: 03/25/2019 10:59 AM Performed by: Larene Beach, CRNA Pre-anesthesia Checklist: Patient identified, Emergency Drugs available, Suction available and Patient being monitored Patient Re-evaluated:Patient Re-evaluated prior to induction Oxygen Delivery Method: Circle system utilized Preoxygenation: Pre-oxygenation with 100% oxygen Induction Type: IV induction Ventilation: Mask ventilation without difficulty Laryngoscope Size: Mac and 3 Grade View: Grade I Nasal Tubes: Right, Nasal prep performed, Nasal Rae and Magill forceps- large, utilized Tube size: 7.0 mm Number of attempts: 1 Airway Equipment and Method: Oral airway Placement Confirmation: ETT inserted through vocal cords under direct vision,  positive ETCO2 and breath sounds checked- equal and bilateral Tube secured with: Tape Dental Injury: Teeth and Oropharynx as per pre-operative assessment

## 2019-03-25 NOTE — Transfer of Care (Signed)
Immediate Anesthesia Transfer of Care Note  Patient: Karen Ferguson  Procedure(s) Performed: DENTAL EXTRACTIONS TEETH NUMBER THREE, FOUR, FIVE, SIX, SEVEN, NINE, TWELVE, FOURTEEN, THIRTY AND ALVEOLOPLASTY (N/A Mouth)  Patient Location: PACU  Anesthesia Type:General  Level of Consciousness: awake, alert , oriented and patient cooperative  Airway & Oxygen Therapy: Patient Spontanous Breathing and Patient connected to nasal cannula oxygen  Post-op Assessment: Report given to RN and Post -op Vital signs reviewed and stable  Post vital signs: Reviewed and stable  Last Vitals:  Vitals Value Taken Time  BP 140/72 03/25/19 1147  Temp    Pulse 82 03/25/19 1148  Resp 26 03/25/19 1148  SpO2 90 % 03/25/19 1148  Vitals shown include unvalidated device data.  Last Pain:  Vitals:   03/25/19 0910  TempSrc:   PainSc: 0-No pain         Complications: No apparent anesthesia complications

## 2019-03-25 NOTE — Op Note (Signed)
03/25/2019  11:32 AM  PATIENT:  [name]  63 y.o. female  PRE-OPERATIVE DIAGNOSIS:  NON RESTORABLE TEETH # THREE, FOUR, FIVE, SIX, SEVEN, NINE, TWELVE, FOURTEEN, THIRTY   POST-OPERATIVE DIAGNOSIS:  SAME  PROCEDURE:  Procedure(s): DENTAL EXTRACTIONS TEETH NUMBER THREE, FOUR, FIVE, SIX, SEVEN, NINE, TWELVE, FOURTEEN, THIRTY AND ALVEOLOPLASTY  SURGEON:  Surgeon(s): Diona Browner, DDS  ANESTHESIA:   local and general  EBL:  minimal  DRAINS: none   SPECIMEN:  No Specimen  COUNTS:  YES  PLAN OF CARE: Discharge to home after PACU  PATIENT DISPOSITION:  PACU - hemodynamically stable.   PROCEDURE DETAILS: Dictation # [id number]  Karen Ferguson, DMD 03/25/2019 11:32 AM

## 2019-03-25 NOTE — Progress Notes (Signed)
mouthcare provided

## 2019-03-25 NOTE — H&P (Signed)
HISTORY AND PHYSICAL  Karen Ferguson is a 63 y.o. female patient with CC: painful teeth.  No diagnosis found.  Past Medical History:  Diagnosis Date  . Arthritis   . Asthma    Albuterol prn  . Bronchitis   . Degenerative disc disease   . Depression   . Diabetes mellitus without complication ([place])    Borderline diabetic in the past per pt  . History of echocardiogram    Echo 1/18: EF 60-65, no RWMA, Gr 1 DD, PASP 20  . Hypertension   . PVC's (premature ventricular contractions)    Holter 12/17: NSR, occ PAC/PVCs, no AFib; one 4 beat run NSVT    Current Facility-Administered Medications  Medication Dose Route Frequency Provider Last Rate Last Dose  . ceFAZolin (ANCEF) IVPB 2g/100 mL premix  2 g Intravenous On Call to OR Diona Browner, DDS      . lactated ringers infusion   Intravenous Continuous Lyn Hollingshead, MD       Allergies  Allergen Reactions  . Shellfish Allergy Hives   Active Problems:   * No active hospital problems. *  Vitals: Blood pressure (!) 142/75, pulse 62, temperature 97.9 F (36.6 C), temperature source Oral, resp. rate 20, height 5\' 5"  (1.651 m), weight 93.4 kg, SpO2 100 %. Lab results: Results for orders placed or performed during the hospital encounter of 03/25/19 (from the past 24 hour(s))  Glucose, capillary     Status: None   Collection Time: 03/25/19  8:53 AM  Result Value Ref Range   Glucose-Capillary 82 70 - 99 mg/dL   Comment 1 Notify RN    Comment 2 Document in Chart    Radiology Results: No results found. General appearance: alert, cooperative and no distress Head: Normocephalic, without obvious abnormality, atraumatic Eyes: negative Nose: Nares normal. Septum midline. Mucosa normal. No drainage or sinus tenderness. Throat: multiple carious teeth #3, 4, 5, 6, 7, 9, 12, 14. Pharynx clear.  Neck: no adenopathy and supple, symmetrical, trachea midline Resp: clear to auscultation bilaterally Cardio: regular rate and rhythm, S1, S2  normal, no murmur, click, rub or gallop  Assessment: Multiple non-restorable teeth secondary to dental caries.   Plan: Multiple dental extractions. GA. Day surgery.    Diona Browner 03/25/2019

## 2019-03-25 NOTE — Anesthesia Postprocedure Evaluation (Signed)
Anesthesia Post Note  Patient: Karen Ferguson  Procedure(s) Performed: DENTAL EXTRACTIONS TEETH NUMBER THREE, FOUR, FIVE, SIX, SEVEN, NINE, TWELVE, FOURTEEN, THIRTY AND ALVEOLOPLASTY (N/A Mouth)     Patient location during evaluation: PACU Anesthesia Type: General Level of consciousness: awake Pain management: pain level controlled Vital Signs Assessment: post-procedure vital signs reviewed and stable Respiratory status: spontaneous breathing Cardiovascular status: stable Postop Assessment: no apparent nausea or vomiting Anesthetic complications: no    Last Vitals:  Vitals:   03/25/19 1200 03/25/19 1215  BP: (!) 156/78 (!) 144/77  Pulse: 84 88  Resp: 20 19  Temp:    SpO2: 93% 95%    Last Pain:  Vitals:   03/25/19 1215  TempSrc:   PainSc: 0-No pain   Pain Goal:                   Huston Foley

## 2019-03-25 NOTE — Anesthesia Preprocedure Evaluation (Signed)
Anesthesia Evaluation  Patient identified by MRN, date of birth, ID band Patient awake    Reviewed: Allergy & Precautions, NPO status , Patient's Chart, lab work & pertinent test results  Airway Mallampati: I       Dental  (+) Missing, Poor Dentition   Pulmonary asthma ,    Pulmonary exam normal breath sounds clear to auscultation       Cardiovascular hypertension, Pt. on medications Normal cardiovascular exam Rhythm:Regular Rate:Normal     Neuro/Psych    GI/Hepatic GERD  Medicated and Controlled,  Endo/Other    Renal/GU   negative genitourinary   Musculoskeletal   Abdominal (+) + obese,   Peds  Hematology negative hematology ROS (+)   Anesthesia Other Findings   Reproductive/Obstetrics                             Anesthesia Physical Anesthesia Plan  ASA: II  Anesthesia Plan: General   Post-op Pain Management:    Induction: Intravenous  PONV Risk Score and Plan: 4 or greater and Ondansetron, Treatment may vary due to age or medical condition and Midazolam  Airway Management Planned: Nasal ETT  Additional Equipment: None  Intra-op Plan:   Post-operative Plan: Extubation in OR  Informed Consent: I have reviewed the patients History and Physical, chart, labs and discussed the procedure including the risks, benefits and alternatives for the proposed anesthesia with the patient or authorized representative who has indicated his/her understanding and acceptance.     Dental advisory given  Plan Discussed with: CRNA  Anesthesia Plan Comments:         Anesthesia Quick Evaluation

## 2019-03-26 ENCOUNTER — Encounter (HOSPITAL_COMMUNITY): Payer: Self-pay | Admitting: Oral Surgery

## 2019-04-26 MED FILL — PANTOPRAZOLE SOD DR 40 MG T: 40 | 30 days supply | Qty: 30 | Fill #0

## 2019-04-26 MED FILL — VALSARTAN-HCTZ 80-12.5 MG T: 80-12.5 | 30 days supply | Qty: 30 | Fill #2

## 2019-05-03 ENCOUNTER — Other Ambulatory Visit: Payer: Self-pay | Admitting: Gastroenterology

## 2019-05-03 DIAGNOSIS — K862 Cyst of pancreas: Secondary | ICD-10-CM

## 2019-05-03 MED FILL — LINZESS 290 MCG CAPSULE: 290 | 90 days supply | Qty: 90 | Fill #0

## 2019-05-04 MED FILL — ALBUTEROL SULFATE HFA 108 (: 108 (90 BAS | 25 days supply | Qty: 18 | Fill #0

## 2019-05-11 ENCOUNTER — Ambulatory Visit
Admission: RE | Admit: 2019-05-11 | Discharge: 2019-05-11 | Disposition: A | Discharge status: Home / Self Care | Payer: Medicare Other | Source: Ambulatory Visit | Attending: Gastroenterology | Admitting: Gastroenterology

## 2019-05-11 ENCOUNTER — Other Ambulatory Visit: Payer: Self-pay

## 2019-05-11 DIAGNOSIS — K862 Cyst of pancreas: Secondary | ICD-10-CM

## 2019-05-11 MED ORDER — IOPAMIDOL (ISOVUE-300) INJECTION 61%
100.0000 mL | Freq: Once | INTRAVENOUS | Status: AC | PRN
  Administered 2019-05-11: 100 mL via INTRAVENOUS

## 2019-05-30 MED FILL — VALSARTAN-HCTZ 80-12.5 MG T: 80-12.5 | 30 days supply | Qty: 30 | Fill #3

## 2019-05-30 MED FILL — PANTOPRAZOLE SOD DR 40 MG T: 40 | 30 days supply | Qty: 30 | Fill #1

## 2019-06-09 MED FILL — ALBUTEROL SULFATE HFA 108 (: 108 (90 BAS | 25 days supply | Qty: 18 | Fill #0

## 2019-06-09 MED FILL — VALSARTAN-HCTZ 80-12.5 MG T: 80-12.5 | 90 days supply | Qty: 90 | Fill #0

## 2019-06-09 MED FILL — rOPINIRole HCL 5 MG TABS: 5 | 90 days supply | Qty: 90 | Fill #0

## 2019-06-09 MED FILL — VIT D3-50 50,000 UNITS CAPS: 1.25 MG | 84 days supply | Qty: 12 | Fill #0

## 2019-06-24 MED FILL — PANTOPRAZOLE SOD DR 40 MG T: 40 | 30 days supply | Qty: 30 | Fill #2

## 2019-07-15 MED FILL — HYDROCORTISONE 1% CREAM: 1 | 15 days supply | Qty: 28 | Fill #0

## 2019-07-18 MED FILL — PANTOPRAZOLE SOD DR 40 MG T: 40 | 30 days supply | Qty: 30 | Fill #3

## 2019-09-05 ENCOUNTER — Other Ambulatory Visit (HOSPITAL_COMMUNITY): Payer: Self-pay | Admitting: Internal Medicine

## 2019-09-05 MED FILL — VIT D3-50 50,000 UNITS CAPS: 1.25 MG | 84 days supply | Qty: 12 | Fill #1

## 2019-09-05 MED FILL — ALBUTEROL SULFATE HFA 108 (: 108 (90 BAS | 25 days supply | Qty: 9 | Fill #0

## 2019-09-05 MED FILL — PANTOPRAZOLE SOD DR 40 MG T: 40 | 30 days supply | Qty: 30 | Fill #4

## 2019-09-12 MED FILL — CEPHALEXIN 500 MG CAPSULE: 500 | 5 days supply | Qty: 10 | Fill #0

## 2019-09-22 MED FILL — FLUoxetine HCL 10 MG CAPS: 10 | 30 days supply | Qty: 30 | Fill #0

## 2019-09-22 MED FILL — ARIPiprazole 5 MG TABS: 5 | 30 days supply | Qty: 30 | Fill #0

## 2019-11-14 MED FILL — PANTOPRAZOLE SOD DR 40 MG T: 40 | 30 days supply | Qty: 30 | Fill #5

## 2019-11-22 ENCOUNTER — Telehealth: Payer: Self-pay | Admitting: Neurology

## 2019-11-22 NOTE — Telephone Encounter (Signed)
Patient called in and wants to see if we would recommend her to go somewhere for her legs. Her legs still are still having nerve problems with numbness and pain.

## 2019-11-22 NOTE — Telephone Encounter (Signed)
Spoke with pt re request to see someone about her leg pain/numbness. She would like to see Dr Posey Pronto again. Can you call pt first thing (when you can) tomorrow morning to schedule an appt with Dr Posey Pronto? Thanks

## 2019-12-16 MED FILL — PANTOPRAZOLE SOD DR 40 MG T: 40 | 30 days supply | Qty: 30 | Fill #5

## 2020-01-09 MED FILL — PANTOPRAZOLE SOD DR 40 MG T: 40 | 90 days supply | Qty: 180 | Fill #0

## 2020-01-09 MED FILL — LINZESS 290 MCG CAPSULE: 290 | 90 days supply | Qty: 90 | Fill #1

## 2020-01-19 MED FILL — PANTOPRAZOLE SOD DR 40 MG T: 40 | 90 days supply | Qty: 180 | Fill #0

## 2020-01-19 MED FILL — LINZESS 290 MCG CAPSULE: 290 | 90 days supply | Qty: 90 | Fill #1

## 2020-01-31 MED FILL — ALBUTEROL SULFATE HFA 108 (: 108 (90 BAS | 25 days supply | Qty: 9 | Fill #1

## 2020-01-31 MED FILL — VALSARTAN-HCTZ 80-12.5 MG T: 80-12.5 | 90 days supply | Qty: 90 | Fill #2

## 2020-02-21 MED FILL — ARIPiprazole 5 MG TABS: 5 | 30 days supply | Qty: 30 | Fill #0

## 2020-02-21 MED FILL — FLUoxetine HCL 10 MG CAPS: 10 | 30 days supply | Qty: 30 | Fill #0

## 2020-02-27 ENCOUNTER — Ambulatory Visit (INDEPENDENT_AMBULATORY_CARE_PROVIDER_SITE_OTHER): Payer: Medicare Other | Admitting: Neurology

## 2020-02-27 ENCOUNTER — Other Ambulatory Visit: Payer: Self-pay

## 2020-02-27 ENCOUNTER — Encounter: Payer: Self-pay | Admitting: Neurology

## 2020-02-27 ENCOUNTER — Other Ambulatory Visit (INDEPENDENT_AMBULATORY_CARE_PROVIDER_SITE_OTHER): Payer: Medicare Other

## 2020-02-27 VITALS — BP 146/85 | HR 81 | Ht 65.0 in | Wt 218.0 lb

## 2020-02-27 DIAGNOSIS — M2550 Pain in unspecified joint: Secondary | ICD-10-CM | POA: Diagnosis not present

## 2020-02-27 DIAGNOSIS — R202 Paresthesia of skin: Secondary | ICD-10-CM

## 2020-02-27 LAB — TSH: TSH: 0.71 u[IU]/mL (ref 0.35–4.50)

## 2020-02-27 LAB — VITAMIN B12: Vitamin B-12: 263 pg/mL (ref 211–911)

## 2020-02-27 NOTE — Progress Notes (Signed)
Follow-up Visit   Date: 02/27/20   Karen Ferguson MRN: [id number] DOB: 11/06/1955   Interim History: Karen Ferguson is a 64 y.o. right-handed female with depression, hypertension, and asthma returning to the clinic for follow-up of bilateral feet numbness.  The patient was accompanied to the clinic by self.  History of present illness: Starting around early 2000s, she began having numbness in the feet which is constant.  She occasionally has tingling in the feet.  She complains of imbalance and has fallen once over the past year.  She walks unassisted.  Symptoms are not constant and worse with exertion.  Nothing that helps any of her symptoms.  She also complains of generalized pain in the shoulders, knees, hips, as well as myalgias in the arms and legs.  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  She does not have history of diabetes, alcohol abuse, family history of neuropathy.  UPDATE 02/27/2020:  She is here with complaints of generalized joint pain involving the knees, hips, and ankles.  She also complains of numbness/tingling involving the feet and sharp pain in the arms. Prior NCS/EMG of the legs did not show any neuropathy.  There was a mild R S1 radiculopathy.  She has feel fatigued all the time.   Medications:  Current Outpatient Medications on File Prior to Visit  Medication Sig Dispense Refill  . albuterol (VENTOLIN HFA) 108 (90 Base) MCG/ACT inhaler Inhale 1-2 puffs into the lungs every 6 (six) hours as needed for wheezing or shortness of breath.    Marland Kitchen amLODipine (NORVASC) 5 MG tablet Take 1 tablet (5 mg total) by mouth daily. (Patient not taking: Reported on 03/16/2019) 90 tablet 3  . amoxicillin (AMOXIL) 500 MG capsule Take 1 capsule (500 mg total) by mouth 3 (three) times daily. 21 capsule 0  . methocarbamol (ROBAXIN) 500 MG tablet Take 1 tablet (500 mg total) by mouth 2 (two) times daily. (Patient taking differently: Take 500 mg by mouth 3 (three) times daily as  needed for muscle spasms. ) 20 tablet 0  . oxyCODONE-acetaminophen (PERCOCET) 5-325 MG tablet Take 1 tablet by mouth every 4 (four) hours as needed. 30 tablet 0  . pantoprazole (PROTONIX) 20 MG tablet Take 40 mg by mouth daily.     . valsartan-hydrochlorothiazide (DIOVAN-HCT) 80-12.5 MG tablet Take 1 tablet by mouth daily.      No current facility-administered medications on file prior to visit.    Allergies:  Allergies  Allergen Reactions  . Shellfish Allergy Hives    Vital Signs:  There were no vitals taken for this visit.    Neurological Exam: MENTAL STATUS including orientation to time, place, person, recent and remote memory, attention span and concentration, language, and fund of knowledge is normal.  Speech is not dysarthric.  CRANIAL NERVES:  No visual field defects.  Pupils equal round and reactive to light.  Normal conjugate, extra-ocular eye movements in all directions of gaze.  No ptosis.  Face is symmetric. Palate elevates symmetrically.  Tongue is midline.  MOTOR:  Motor strength is 5/5 in all extremities.  No atrophy, fasciculations or abnormal movements.  No pronator drift.  Tone is normal.    MSRs:  Reflexes are 2+/4 throughout.  SENSORY:  Intact to vibration throughout.  COORDINATION/GAIT:  Normal finger-to- nose-finger.  Gait is slow, antalgic, unassisted and appears stable  Data: NCS/EMG of the legs 02/10/2019: 1. Chronic S1 radiculopathy affecting the right lower extremity, very mild. 2. There is no  evidence of a sensorimotor polyneuropathy affecting the lower extremities.  IMPRESSION/PLAN: Generalized paresthesias and polyarthralgia. Her neurological exam is normal, except antalgic gait. Paresthesias does not conform to a dermatomal or cutaneous nerve distribution. Prior NCS/EMG did not show evidence of neuropathy  - Check vitamin B12 and TSH  - Trial of gabapentin and PT declined  - Follow-up with PCP or orthopaedics for joint pain   Thank you for  allowing me to participate in patient's care.  If I can answer any additional questions, I would be pleased to do so.    Sincerely,    [name] K. Posey Pronto, DO

## 2020-02-27 NOTE — Patient Instructions (Signed)
Check labs  Follow-up with orthopaedics for your joint pain

## 2020-02-29 ENCOUNTER — Telehealth: Payer: Self-pay

## 2020-02-29 NOTE — Telephone Encounter (Signed)
Called patient and left a message for a call back.  

## 2020-02-29 NOTE — Telephone Encounter (Signed)
Called patient ad informed her of the results of her labs and explained that Dr. Posey Pronto would like her to purchase a B12 1069mcg OTC. Patient wanted an RX sent but I informed patient she may purchase over the counter at her local pharmacy. Patient verbalized understanding.

## 2020-02-29 NOTE — Telephone Encounter (Signed)
Patient called in and left a message returning Mahina's call 

## 2020-02-29 NOTE — Telephone Encounter (Signed)
-----   Message from Alda Berthold, DO sent at 02/28/2020  3:55 PM EDT ----- Please inform patient that her vitamin B12 is borderline-now and I want her to start OTC vitamin B12 1062mcg daily.  Thyroid is normal. Thanks.

## 2020-03-26 MED FILL — ALBUTEROL SULFATE HFA 108 (: 108 (90 BAS | 25 days supply | Qty: 9 | Fill #2

## 2020-04-03 ENCOUNTER — Other Ambulatory Visit: Payer: Self-pay | Admitting: Gastroenterology

## 2020-04-03 DIAGNOSIS — R1013 Epigastric pain: Secondary | ICD-10-CM

## 2020-04-03 DIAGNOSIS — R1319 Other dysphagia: Secondary | ICD-10-CM

## 2020-04-09 ENCOUNTER — Ambulatory Visit
Admission: RE | Admit: 2020-04-09 | Discharge: 2020-04-09 | Disposition: A | Discharge status: Home / Self Care | Payer: Medicare Other | Source: Ambulatory Visit | Attending: Gastroenterology | Admitting: Gastroenterology

## 2020-04-09 ENCOUNTER — Other Ambulatory Visit: Payer: Self-pay | Admitting: Gastroenterology

## 2020-04-09 DIAGNOSIS — K5901 Slow transit constipation: Secondary | ICD-10-CM

## 2020-04-11 ENCOUNTER — Other Ambulatory Visit: Payer: Self-pay | Admitting: Gastroenterology

## 2020-04-11 ENCOUNTER — Ambulatory Visit
Admission: RE | Admit: 2020-04-11 | Discharge: 2020-04-11 | Disposition: A | Discharge status: Home / Self Care | Payer: Medicare Other | Source: Ambulatory Visit | Attending: Gastroenterology | Admitting: Gastroenterology

## 2020-04-11 DIAGNOSIS — K5901 Slow transit constipation: Secondary | ICD-10-CM

## 2020-04-13 ENCOUNTER — Other Ambulatory Visit: Payer: Self-pay | Admitting: Gastroenterology

## 2020-04-13 ENCOUNTER — Other Ambulatory Visit (HOSPITAL_COMMUNITY): Payer: Self-pay | Admitting: Gastroenterology

## 2020-04-13 ENCOUNTER — Ambulatory Visit
Admission: RE | Admit: 2020-04-13 | Discharge: 2020-04-13 | Disposition: A | Discharge status: Home / Self Care | Payer: Medicare Other | Source: Ambulatory Visit | Attending: Gastroenterology | Admitting: Gastroenterology

## 2020-04-13 DIAGNOSIS — K5901 Slow transit constipation: Secondary | ICD-10-CM

## 2020-04-13 MED FILL — LINZESS 290 MCG CAPSULE: 290 | 90 days supply | Qty: 90 | Fill #0

## 2020-04-18 ENCOUNTER — Ambulatory Visit
Admission: RE | Admit: 2020-04-18 | Discharge: 2020-04-18 | Disposition: A | Discharge status: Home / Self Care | Payer: Medicare Other | Source: Ambulatory Visit | Attending: Gastroenterology | Admitting: Gastroenterology

## 2020-04-18 DIAGNOSIS — R1319 Other dysphagia: Secondary | ICD-10-CM

## 2020-04-18 DIAGNOSIS — R1013 Epigastric pain: Secondary | ICD-10-CM

## 2020-05-14 MED FILL — VALSARTAN-HCTZ 80-12.5 MG T: 80-12.5 | 90 days supply | Qty: 90 | Fill #3

## 2020-05-14 MED FILL — LINZESS 290 MCG CAPSULE: 290 | 90 days supply | Qty: 90 | Fill #0

## 2020-05-14 MED FILL — ALBUTEROL SULFATE HFA 108 (: 108 (90 BAS | 25 days supply | Qty: 9 | Fill #3

## 2020-06-21 ENCOUNTER — Other Ambulatory Visit (HOSPITAL_COMMUNITY): Payer: Self-pay | Admitting: Internal Medicine

## 2020-06-21 MED FILL — ALBUTEROL SULFATE HFA 108 (: 108 (90 BAS | 25 days supply | Qty: 9 | Fill #0

## 2020-06-21 MED FILL — VIT D3-50 50,000 UNITS CAPS: 1.25 MG | 84 days supply | Qty: 12 | Fill #0

## 2020-08-13 ENCOUNTER — Other Ambulatory Visit (HOSPITAL_COMMUNITY): Payer: Self-pay | Admitting: Internal Medicine

## 2020-08-13 MED FILL — VALSARTAN-HCTZ 80-12.5 MG T: 80-12.5 | 90 days supply | Qty: 90 | Fill #0

## 2020-08-13 MED FILL — ALBUTEROL SULFATE HFA 108 (: 108 (90 BAS | 25 days supply | Qty: 9 | Fill #1

## 2020-09-13 ENCOUNTER — Other Ambulatory Visit (HOSPITAL_COMMUNITY): Payer: Self-pay

## 2020-11-02 ENCOUNTER — Encounter (HOSPITAL_COMMUNITY): Payer: Self-pay | Admitting: *Deleted

## 2020-11-02 ENCOUNTER — Emergency Department (HOSPITAL_COMMUNITY)
Admission: EM | Admit: 2020-11-02 | Discharge: 2020-11-02 | Disposition: A | Discharge status: Home / Self Care | Payer: Medicare Other | Attending: Emergency Medicine | Admitting: Emergency Medicine

## 2020-11-02 ENCOUNTER — Other Ambulatory Visit (HOSPITAL_COMMUNITY): Payer: Self-pay

## 2020-11-02 ENCOUNTER — Emergency Department (HOSPITAL_COMMUNITY): Payer: Medicare Other

## 2020-11-02 ENCOUNTER — Other Ambulatory Visit: Payer: Self-pay

## 2020-11-02 DIAGNOSIS — E119 Type 2 diabetes mellitus without complications: Secondary | ICD-10-CM | POA: Insufficient documentation

## 2020-11-02 DIAGNOSIS — J45901 Unspecified asthma with (acute) exacerbation: Secondary | ICD-10-CM | POA: Insufficient documentation

## 2020-11-02 DIAGNOSIS — S0990XA Unspecified injury of head, initial encounter: Secondary | ICD-10-CM | POA: Insufficient documentation

## 2020-11-02 DIAGNOSIS — Y92002 Bathroom of unspecified non-institutional (private) residence single-family (private) house as the place of occurrence of the external cause: Secondary | ICD-10-CM | POA: Insufficient documentation

## 2020-11-02 DIAGNOSIS — I10 Essential (primary) hypertension: Secondary | ICD-10-CM | POA: Insufficient documentation

## 2020-11-02 DIAGNOSIS — W19XXXA Unspecified fall, initial encounter: Secondary | ICD-10-CM

## 2020-11-02 DIAGNOSIS — W010XXA Fall on same level from slipping, tripping and stumbling without subsequent striking against object, initial encounter: Secondary | ICD-10-CM | POA: Diagnosis not present

## 2020-11-02 DIAGNOSIS — Z79899 Other long term (current) drug therapy: Secondary | ICD-10-CM | POA: Diagnosis not present

## 2020-11-02 MED ORDER — NAPROXEN 500 MG PO TABS
500.0000 mg | ORAL_TABLET | Freq: Two times a day (BID) | ORAL | 0 refills | Status: DC
  Filled 2020-11-02: qty 30, 15d supply, fill #0

## 2020-11-02 MED ORDER — FENTANYL CITRATE (PF) 100 MCG/2ML IJ SOLN
50.0000 ug | Freq: Once | INTRAMUSCULAR | Status: AC
  Administered 2020-11-02: 50 ug via INTRAMUSCULAR

## 2020-11-02 MED ORDER — ONDANSETRON 8 MG PO TBDP
8.0000 mg | ORAL_TABLET | Freq: Once | ORAL | Status: AC
  Administered 2020-11-02: 8 mg via ORAL
  Filled 2020-11-02: qty 1

## 2020-11-02 MED ORDER — IBUPROFEN 400 MG PO TABS
600.0000 mg | ORAL_TABLET | Freq: Once | ORAL | Status: AC
  Administered 2020-11-02: 600 mg via ORAL
  Filled 2020-11-02: qty 2

## 2020-11-02 MED ORDER — FENTANYL CITRATE (PF) 100 MCG/2ML IJ SOLN
50.0000 ug | Freq: Once | INTRAMUSCULAR | Status: DC
  Filled 2020-11-02: qty 2

## 2020-11-02 NOTE — ED Triage Notes (Signed)
Slipped up and fell in [employer] after coming from bathroom. States floor was wet

## 2020-11-02 NOTE — ED Triage Notes (Signed)
Pain in left knee, back, neck and head. Questionable LOC

## 2020-11-02 NOTE — ED Provider Notes (Signed)
[name] Provider Note   CSN: [id number] Arrival date & time: 11/02/20  1309     History No chief complaint on file.   Karen Ferguson is a 65 y.o. female.  HPI  Patient presents with left knee and back pain.  This started when she fell flat [employer].  She lost her balance while using the bathroom and slipped and landed on her back.  She does not remember hitting her head after the fall and is not sure if she lost consciousness or not.  She has not any blood thinners.  She denies any vision changes, headache, nausea, vomiting.  She is able to ambulate, but reports pain with flexion of the knee.  Past Medical History:  Diagnosis Date   Arthritis    Asthma    Albuterol prn   Bronchitis    Degenerative disc disease    Depression    Diabetes mellitus without complication ([place])    Borderline diabetic in the past per pt   History of echocardiogram    Echo 1/18: EF 60-65, no RWMA, Gr 1 DD, PASP 20   Hypertension    PVC's (premature ventricular contractions)    Holter 12/17: NSR, occ PAC/PVCs, no AFib; one 4 beat run NSVT    Patient Active Problem List   Diagnosis Date Noted   Palpitations 04/01/2016   Family history of premature coronary heart disease 04/01/2016   Obstructive sleep apnea 12/28/2015   Bilateral leg numbness 07/03/2015   GERD (gastroesophageal reflux disease) 07/03/2015   Constipation 07/03/2015   Dizziness and giddiness 01/10/2015   Foot pain 01/10/2013   Tendon tear, ankle 01/10/2013   Cyst and pseudocyst of pancreas 03/16/2012   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   Degenerative disc disease    Asthma exacerbation 06/10/2011   Obesity 10/17/2010   DENTAL CARIES 12/15/2008   OSTEOARTHRITIS, GENERALIZED, MULTIPLE JOINTS 12/15/2008   Essential hypertension 09/18/2007   OVERACTIVE BLADDER 04/29/2007   RECTAL BLEEDING 09/23/2006    Past Surgical History:  Procedure Laterality Date   ABDOMINAL HYSTERECTOMY  1988   for fibroid  tumors   CARPAL TUNNEL RELEASE  1985   rt   [place] ARTHROSCOPY Right    TENDON REPAIR Right 03/03/2013   Procedure: RIGHT DEBRIDEMENT AND TENOLYSIS OF PERONEOUS LONGOUS AND BREVIS TENDONS ;  Surgeon: Wylene Simmer, MD;  Location: [place];  Service: Orthopedics;  Laterality: Right;   TOOTH EXTRACTION N/A 03/25/2019   Procedure: DENTAL EXTRACTIONS TEETH NUMBER THREE, FOUR, FIVE, SIX, SEVEN, NINE, TWELVE, FOURTEEN, THIRTY AND ALVEOLOPLASTY;  Surgeon: Diona Browner, DDS;  Location: [place];  Service: Oral Surgery;  Laterality: N/A;     OB History   No obstetric history on file.     Family History  Problem Relation Age of Onset   Arthritis Mother    Hypertension Mother    Diabetes Mother    Kidney disease Mother    Arrhythmia Mother        AFib   Arthritis Sister    Heart disease Sister    Hypertension Brother    Heart disease Maternal Aunt    Heart failure Maternal Aunt    Heart disease Maternal Uncle    Cancer Maternal Uncle    Heart disease Maternal Grandfather    Heart attack Maternal Grandfather 57       MI    Social History   Tobacco Use   Smoking status:  Never   Smokeless tobacco: Never  Vaping Use   Vaping Use: Never used  Substance Use Topics   Alcohol use: Yes    Comment: occasional   Drug use: No    Home Medications Prior to Admission medications   Medication Sig Start Date End Date Taking? Authorizing Provider  albuterol (VENTOLIN HFA) 108 (90 Base) MCG/ACT inhaler Inhale 1-2 puffs into the lungs every 6 (six) hours as needed for wheezing or shortness of breath.    [provider]  albuterol (VENTOLIN HFA) 108 (90 Base) MCG/ACT inhaler INHALE 2 PUFFS INTO THE LUNGS FOUR TIMES DAILY AS NEEDED FOR SHORTNESS OF BREATH. 06/21/20 06/21/21  Tisovec, Fransico Him, MD  albuterol (VENTOLIN HFA) 108 (90 Base) MCG/ACT inhaler INHALE 2 PUFFS BY MOUTH 4 TIMES A DAY AS NEEDED FOR SHORTNESS OF BREATH  09/05/19 09/04/20  Tisovec, Fransico Him, MD  amLODipine (NORVASC) 5 MG tablet Take 1 tablet (5 mg total) by mouth daily. 12/28/15   Glenis Smoker, MD  amoxicillin (AMOXIL) 500 MG capsule Take 1 capsule (500 mg total) by mouth 3 (three) times daily. 03/25/19   Diona Browner, DMD  Cholecalciferol (VITAMIN D3) 1.25 MG (50000 UT) CAPS TAKE 1 CAPSULE BY MOUTH ONCE A WEEK. 06/21/20 06/21/21  Tisovec, Fransico Him, MD  diphenhydrAMINE (BENADRYL ALLERGY) 25 mg capsule Take 25 mg by mouth every 6 (six) hours as needed.    [provider]  linaclotide (LINZESS) 290 MCG CAPS capsule TAKE 1 CAPSULE BY MOUTH AT LEAST 30 MINUTES BEFORE THE FIRST MEAL OF THE DAY ON AN EMPTY STOMACH 04/13/20 04/13/21  Ronnette Juniper, MD  methocarbamol (ROBAXIN) 500 MG tablet Take 1 tablet (500 mg total) by mouth 2 (two) times daily. Patient taking differently: Take 500 mg by mouth 3 (three) times daily as needed for muscle spasms.  12/07/16   Junius Creamer, NP  oxyCODONE-acetaminophen (PERCOCET) 5-325 MG tablet Take 1 tablet by mouth every 4 (four) hours as needed. Patient not taking: Reported on 02/27/2020 03/25/19   Diona Browner, DMD  pantoprazole (PROTONIX) 20 MG tablet Take 40 mg by mouth daily.  02/16/19   [provider]  valsartan-hydrochlorothiazide (DIOVAN-HCT) 80-12.5 MG tablet Take 1 tablet by mouth daily.  07/02/18   [provider]  valsartan-hydrochlorothiazide (DIOVAN-HCT) 80-12.5 MG tablet TAKE 1 TABLET BY MOUTH DAILY 08/13/20 08/13/21  Tisovec, Fransico Him, MD    Allergies    Shellfish allergy  Review of Systems   Review of Systems  Gastrointestinal:  Negative for nausea and vomiting.  Musculoskeletal:  Positive for back pain and joint swelling. Negative for gait problem.  Skin:  Negative for wound.  Neurological:  Negative for syncope, weakness and headaches.   Physical Exam Updated Vital Signs BP (!) 145/77 (BP Location: Right Arm)   Pulse 65   Temp 98.5 F (36.9 C) (Oral)   Resp 18    SpO2 100%   Physical Exam Vitals and nursing note reviewed. Exam conducted with a chaperone present.  Constitutional:      General: She is not in acute distress.    Appearance: Normal appearance. She is obese.  HENT:     Head: Normocephalic and atraumatic.  Eyes:     General: No scleral icterus.    Extraocular Movements: Extraocular movements intact.     Pupils: Pupils are equal, round, and reactive to light.  Cardiovascular:     Comments: DP/PT 2+ bilaterally  Musculoskeletal:        General: Tenderness present. No swelling or deformity.  Comments: ROM to left knee is limited due to pain. No effusion, no bony tenderness, no crepitus. No midline tenderness to spine. No obvious deformity or discoloration.   Skin:    Capillary Refill: Capillary refill takes less than 2 seconds.     Coloration: Skin is not jaundiced.  Neurological:     Mental Status: She is alert. Mental status is at baseline.     Cranial Nerves: No cranial nerve deficit.     Sensory: No sensory deficit.     Motor: No weakness.     Coordination: Coordination normal.     Comments: Patient ambulatory to the bathroom. CN III-XII grossly in tact. Grip strength equal bilaterally. No sensation changes.     ED Results / Procedures / Treatments   Labs (all labs ordered are listed, but only abnormal results are displayed) Labs Reviewed - No data to display  EKG None  Radiology No results found.  Procedures Procedures   Medications Ordered in ED Medications  ibuprofen (ADVIL) tablet 600 mg (has no administration in time range)    ED Course  I have reviewed the triage vital signs and the nursing notes.  Pertinent labs & imaging results that were available during my care of the patient were reviewed by me and considered in my medical decision making (see chart for details).  Clinical Course as of 11/02/20 1623  Fri Nov 02, 2020  1528 CT Head Wo Contrast No signs of acute trauma, no SAH, no epidural or  subdural hemorrhage.  No skull fracture. [HS]    Clinical Course User Index [HS] Sherrill Raring, PA-C   MDM Rules/Calculators/A&P                          Patient is a 65 year old female presenting for musculoskeletal injuries due to a fall.  Her vitals are stable, she is not toxic appearing. No focal deficits.  Patient is able to ambulate without problems, although she is in some pain.  Imaging studies showed some chronic age-related changes, as well as some patellar fibrosis.  I given her 2 weeks of naproxen as well as an orthopedic surgeon follow-up.  Also putting her in a knee brace.  Discussed return precautions with the patient and she is agreeable to follow-up.  Return precautions were given.  Patient verbalizes understanding. Final Clinical Impression(s) / ED Diagnoses Final diagnoses:  None    Rx / DC Orders ED Discharge Orders     None        Sherrill Raring, [place] 11/02/20 1624    Daleen Bo, MD 11/03/20 1137

## 2020-11-02 NOTE — ED Provider Notes (Signed)
Emergency Medicine Provider Triage Evaluation Note  Karen Ferguson , a 65 y.o. female  was evaluated in triage.  Pt complains of injuries from a fall, patient went to the grocery store to use the bathroom and slipped on her way out landing on her back.  Patient is not on blood thinners, states that she felt a little confused after the fall, unsure if she lost consciousness.  Reports pain in her left as well as left knee..  Review of Systems  Positive: Knee pain, low back pain Negative: Nausea, vomiting, visual disturbance  Physical Exam  BP (!) 145/77 (BP Location: Right Arm)   Pulse 65   Temp 98.5 F (36.9 C) (Oral)   Resp 18   SpO2 100%  Gen:   Awake, no distress   Resp:  Normal effort  MSK:   Moves extremities without difficulty  Other:  TTP left knee and left SI  Medical Decision Making  Medically screening exam initiated at 2:07 PM.  Appropriate orders placed.  KARYN BRULL was informed that the remainder of the evaluation will be completed by another provider, this initial triage assessment does not replace that evaluation, and the importance of remaining in the ED until their evaluation is complete.     Tacy Learn, PA-C 11/02/20 1408    Daleen Bo, MD 11/03/20 1137

## 2020-11-02 NOTE — Discharge Instructions (Addendum)
You were seen today for injury sustained during a fall.  Please take the naproxen twice daily with food and water.  Please take it for the next 14 days.  Given the findings on your x-ray, there were some concerns about chronic changes as well as some atypical findings.  Please make an appointment with Dr. Erlinda Hong to follow-up.  If things worsen, if you experience nausea, vomiting, vision changes, headache that will not stop.  Please return back to the ED.

## 2020-11-03 ENCOUNTER — Ambulatory Visit (HOSPITAL_COMMUNITY)
Admission: EM | Admit: 2020-11-03 | Discharge: 2020-11-03 | Disposition: A | Discharge status: Home / Self Care | Payer: Medicare Other | Attending: Emergency Medicine | Admitting: Emergency Medicine

## 2020-11-03 ENCOUNTER — Other Ambulatory Visit: Payer: Self-pay

## 2020-11-03 ENCOUNTER — Encounter (HOSPITAL_COMMUNITY): Payer: Self-pay | Admitting: Emergency Medicine

## 2020-11-03 DIAGNOSIS — Z760 Encounter for issue of repeat prescription: Secondary | ICD-10-CM

## 2020-11-03 DIAGNOSIS — M25561 Pain in right knee: Secondary | ICD-10-CM

## 2020-11-03 DIAGNOSIS — W19XXXD Unspecified fall, subsequent encounter: Secondary | ICD-10-CM

## 2020-11-03 MED ORDER — NAPROXEN 500 MG PO TABS: 500.0000 mg | ORAL_TABLET | Freq: Two times a day (BID) | ORAL | 0 refills | Status: DC

## 2020-11-03 NOTE — ED Triage Notes (Signed)
Pt presents with right knee and right side lower back pain. States was at [employer] yesterday and slipped on water and fell backwards. Unsure of LOC. States went to [facility] ED yesterday and pain medication was sent to pharmacy that was closed.

## 2020-11-03 NOTE — ED Provider Notes (Signed)
[place]    CSN: [id number] Arrival date & time: 11/03/20  1315      History   Chief Complaint Chief Complaint  Patient presents with   Fall   Knee Pain    Right   Back Pain    Lower    HPI Karen Ferguson is a 65 y.o. female.   Patient here requesting refill for pain medications.  Reports being seen at [facility] following a fall resulting in right knee and right lower back pain.  States she was prescribed pain medications but it was sent to a pharmacy that was closed for the weekend and is requesting a refill of the naproxen.  Not taking pain medication today.  Denies any fevers, chest pain, shortness of breath, N/V/D, numbness, tingling, weakness, abdominal pain, or headaches.     The history is provided by the patient.  Fall  Knee Pain Associated symptoms: back pain   Back Pain  Past Medical History:  Diagnosis Date   Arthritis    Asthma    Albuterol prn   Bronchitis    Degenerative disc disease    Depression    Diabetes mellitus without complication ([place])    Borderline diabetic in the past per pt   History of echocardiogram    Echo 1/18: EF 60-65, no RWMA, Gr 1 DD, PASP 20   Hypertension    PVC's (premature ventricular contractions)    Holter 12/17: NSR, occ PAC/PVCs, no AFib; one 4 beat run NSVT    Patient Active Problem List   Diagnosis Date Noted   Palpitations 04/01/2016   Family history of premature coronary heart disease 04/01/2016   Obstructive sleep apnea 12/28/2015   Bilateral leg numbness 07/03/2015   GERD (gastroesophageal reflux disease) 07/03/2015   Constipation 07/03/2015   Dizziness and giddiness 01/10/2015   Foot pain 01/10/2013   Tendon tear, ankle 01/10/2013   Cyst and pseudocyst of pancreas 03/16/2012   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   Degenerative disc disease    Asthma exacerbation 06/10/2011   Obesity 10/17/2010   DENTAL CARIES 12/15/2008   OSTEOARTHRITIS, GENERALIZED, MULTIPLE JOINTS 12/15/2008   Essential  hypertension 09/18/2007   OVERACTIVE BLADDER 04/29/2007   RECTAL BLEEDING 09/23/2006    Past Surgical History:  Procedure Laterality Date   ABDOMINAL HYSTERECTOMY  1988   for fibroid tumors   CARPAL TUNNEL RELEASE  1985   rt   [place] ARTHROSCOPY Right    TENDON REPAIR Right 03/03/2013   Procedure: RIGHT DEBRIDEMENT AND TENOLYSIS OF PERONEOUS LONGOUS AND BREVIS TENDONS ;  Surgeon: Wylene Simmer, MD;  Location: [place];  Service: Orthopedics;  Laterality: Right;   TOOTH EXTRACTION N/A 03/25/2019   Procedure: DENTAL EXTRACTIONS TEETH NUMBER THREE, FOUR, FIVE, SIX, SEVEN, NINE, TWELVE, FOURTEEN, THIRTY AND ALVEOLOPLASTY;  Surgeon: Diona Browner, DDS;  Location: [place];  Service: Oral Surgery;  Laterality: N/A;    OB History   No obstetric history on file.      Home Medications    Prior to Admission medications   Medication Sig Start Date End Date Taking? Authorizing Provider  naproxen (NAPROSYN) 500 MG tablet Take 1 tablet (500 mg total) by mouth 2 (two) times daily. 11/03/20  Yes Pearson Forster, NP  albuterol (VENTOLIN HFA) 108 (90 Base) MCG/ACT inhaler Inhale 1-2 puffs into the lungs every 6 (six) hours as needed for wheezing or shortness of breath.    [provider]  albuterol (VENTOLIN HFA) 108 (90 Base) MCG/ACT inhaler INHALE 2 PUFFS INTO THE LUNGS FOUR TIMES DAILY AS NEEDED FOR SHORTNESS OF BREATH. 06/21/20 06/21/21  Tisovec, Fransico Him, MD  albuterol (VENTOLIN HFA) 108 (90 Base) MCG/ACT inhaler INHALE 2 PUFFS BY MOUTH 4 TIMES A DAY AS NEEDED FOR SHORTNESS OF BREATH 09/05/19 09/04/20  Tisovec, Fransico Him, MD  amLODipine (NORVASC) 5 MG tablet Take 1 tablet (5 mg total) by mouth daily. 12/28/15   Glenis Smoker, MD  amoxicillin (AMOXIL) 500 MG capsule Take 1 capsule (500 mg total) by mouth 3 (three) times daily. 03/25/19   Diona Browner, DMD  Cholecalciferol (VITAMIN D3) 1.25 MG (50000 UT) CAPS TAKE 1 CAPSULE  BY MOUTH ONCE A WEEK. 06/21/20 06/21/21  Tisovec, Fransico Him, MD  diphenhydrAMINE (BENADRYL ALLERGY) 25 mg capsule Take 25 mg by mouth every 6 (six) hours as needed.    [provider]  linaclotide (LINZESS) 290 MCG CAPS capsule TAKE 1 CAPSULE BY MOUTH AT LEAST 30 MINUTES BEFORE THE FIRST MEAL OF THE DAY ON AN EMPTY STOMACH 04/13/20 04/13/21  Ronnette Juniper, MD  methocarbamol (ROBAXIN) 500 MG tablet Take 1 tablet (500 mg total) by mouth 2 (two) times daily. Patient taking differently: Take 500 mg by mouth 3 (three) times daily as needed for muscle spasms.  12/07/16   Junius Creamer, NP  oxyCODONE-acetaminophen (PERCOCET) 5-325 MG tablet Take 1 tablet by mouth every 4 (four) hours as needed. Patient not taking: Reported on 02/27/2020 03/25/19   Diona Browner, DMD  pantoprazole (PROTONIX) 20 MG tablet Take 40 mg by mouth daily.  02/16/19   [provider]  valsartan-hydrochlorothiazide (DIOVAN-HCT) 80-12.5 MG tablet Take 1 tablet by mouth daily.  07/02/18   [provider]  valsartan-hydrochlorothiazide (DIOVAN-HCT) 80-12.5 MG tablet TAKE 1 TABLET BY MOUTH DAILY 08/13/20 08/13/21  Tisovec, Fransico Him, MD    Family History Family History  Problem Relation Age of Onset   Arthritis Mother    Hypertension Mother    Diabetes Mother    Kidney disease Mother    Arrhythmia Mother        AFib   Arthritis Sister    Heart disease Sister    Hypertension Brother    Heart disease Maternal Aunt    Heart failure Maternal Aunt    Heart disease Maternal Uncle    Cancer Maternal Uncle    Heart disease Maternal Grandfather    Heart attack Maternal Grandfather 47       MI    Social History Social History   Tobacco Use   Smoking status: Never   Smokeless tobacco: Never  Vaping Use   Vaping Use: Never used  Substance Use Topics   Alcohol use: Yes    Comment: occasional   Drug use: No     Allergies   Shellfish allergy   Review of Systems Review of Systems  Musculoskeletal:   Positive for back pain, joint swelling and myalgias.  All other systems reviewed and are negative.   Physical Exam Triage Vital Signs ED Triage Vitals  Enc Vitals Group     BP 11/03/20 1348 (!) 119/54     Pulse Rate 11/03/20 1348 67     Resp 11/03/20 1348 17     Temp 11/03/20 1348 98.7 F (37.1 C)     Temp Source 11/03/20 1348 Oral     SpO2 11/03/20 1348 97 %     Weight --      Height --  Head Circumference --      Peak Flow --      Pain Score 11/03/20 1344 8     Pain Loc --      Pain Edu? --      Excl. in [place]? --    No data found.  Updated Vital Signs BP (!) 119/54 (BP Location: Left Arm)   Pulse 67   Temp 98.7 F (37.1 C) (Oral)   Resp 17   SpO2 97%   Visual Acuity Right Eye Distance:   Left Eye Distance:   Bilateral Distance:    Right Eye Near:   Left Eye Near:    Bilateral Near:     Physical Exam Vitals and nursing note reviewed.  Constitutional:      General: She is not in acute distress.    Appearance: Normal appearance. She is not ill-appearing, toxic-appearing or diaphoretic.  HENT:     Head: Normocephalic and atraumatic.  Eyes:     Conjunctiva/sclera: Conjunctivae normal.  Cardiovascular:     Rate and Rhythm: Normal rate.     Pulses: Normal pulses.  Pulmonary:     Effort: Pulmonary effort is normal.  Abdominal:     General: Abdomen is flat.  Musculoskeletal:        General: Normal range of motion.     Cervical back: Normal range of motion.  Skin:    General: Skin is warm and dry.  Neurological:     General: No focal deficit present.     Mental Status: She is alert and oriented to person, place, and time.  Psychiatric:        Mood and Affect: Mood normal.     UC Treatments / Results  Labs (all labs ordered are listed, but only abnormal results are displayed) Labs Reviewed - No data to display  EKG   Radiology CT Head Wo Contrast  Result Date: 11/02/2020 CLINICAL DATA:  Fall, head trauma. EXAM: CT HEAD WITHOUT CONTRAST  TECHNIQUE: Contiguous axial images were obtained from the base of the skull through the vertex without intravenous contrast. COMPARISON:  06/12/2009 FINDINGS: Brain: The brainstem, cerebellum, cerebral peduncles, thalami, basal ganglia, basilar cisterns, and ventricular system appear within normal limits. No intracranial hemorrhage, mass lesion, or acute CVA. Vascular: Unremarkable Skull: Unremarkable Sinuses/Orbits: Mucous retention cyst in the left maxillary sinus. Other: No supplemental non-categorized findings. IMPRESSION: 1. No acute intracranial findings. 2. Mucous retention cyst in left maxillary sinus. Electronically Signed   By: Van Clines M.D.   On: 11/02/2020 15:12   DG Knee Complete 4 Views Left  Result Date: 11/02/2020 CLINICAL DATA:  Pain following fall EXAM: LEFT KNEE - COMPLETE 4+ VIEW COMPARISON:  None. FINDINGS: Frontal, lateral, and bilateral oblique views were obtained. There is no fracture or dislocation. No joint effusion. There is mild narrowing medially in the patellofemoral joint. There is spurring medially. There are anterior spurs arising from the superior and inferior patella. IMPRESSION: Osteoarthritic change, most notably medially. Spurring along the anterior patella may represent distal quadriceps and proximal patellar tendinosis. No appreciable fracture, dislocation, or joint effusion. Electronically Signed   By: Lowella Grip III M.D.   On: 11/02/2020 15:30   DG Hip Unilat With Pelvis 2-3 Views Left  Result Date: 11/02/2020 CLINICAL DATA:  Pain following fall EXAM: DG HIP (WITH OR WITHOUT PELVIS) 2-3V LEFT COMPARISON:  None. FINDINGS: Frontal pelvis as well as frontal and lateral left hip images were obtained. No fracture or dislocation. There is slight symmetric narrowing of  each hip joint. There is bony overgrowth along each superolateral acetabulum. There is slight osteoarthritic change in the sacroiliac joint. No erosions. IMPRESSION: Slight symmetric  narrowing of each hip joint. Bony overgrowth along each superolateral acetabulum potentially places patient at increased risk for femoroacetabular impingement. There is mild sacroiliac joint osteoarthritic change. No fracture or dislocation. Electronically Signed   By: Lowella Grip III M.D.   On: 11/02/2020 15:31    Procedures Procedures (including critical care time)  Medications Ordered in UC Medications - No data to display  Initial Impression / Assessment and Plan / UC Course  I have reviewed the triage vital signs and the nursing notes.  Pertinent labs & imaging results that were available during my care of the patient were reviewed by me and considered in my medical decision making (see chart for details).    Assessment negative for red flags or concerns.  Patient had full work-up at [facility] emergency department yesterday and does not require any additional work-up at this time.  New prescription for naproxen sent in to [facility].  Instructed patient to take naproxen twice a day for the next 14 days as she was told yesterday.  Encourage fluids and rest.  May use ice, compression, and elevation as needed for comfort.  Follow-up with primary care provider as needed.  Final Clinical Impressions(s) / UC Diagnoses   Final diagnoses:  Acute pain of right knee  Fall, subsequent encounter     Discharge Instructions      Take the naproxen twice a day for the next 14 days.   Rest as much as possible Ice for 10-15 minutes every 4-6 hours as needed for pain and swelling Compression- use an ace bandage or splint for comfort Elevate above your hip/heart when sitting and laying down  Return or go to the Emergency Department if symptoms worsen or do not improve in the next few days.      ED Prescriptions     Medication Sig Dispense Auth. Provider   naproxen (NAPROSYN) 500 MG tablet Take 1 tablet (500 mg total) by mouth 2 (two) times daily. 30 tablet Pearson Forster, NP       PDMP not reviewed this encounter.   Pearson Forster, NP 11/03/20 1523

## 2020-11-03 NOTE — Discharge Instructions (Addendum)
Take the naproxen twice a day for the next 14 days.   Rest as much as possible Ice for 10-15 minutes every 4-6 hours as needed for pain and swelling Compression- use an ace bandage or splint for comfort Elevate above your hip/heart when sitting and laying down  Return or go to the Emergency Department if symptoms worsen or do not improve in the next few days.

## 2020-11-05 ENCOUNTER — Other Ambulatory Visit (HOSPITAL_COMMUNITY): Payer: Self-pay

## 2020-11-09 ENCOUNTER — Other Ambulatory Visit (HOSPITAL_COMMUNITY): Payer: Self-pay

## 2020-11-09 ENCOUNTER — Ambulatory Visit (INDEPENDENT_AMBULATORY_CARE_PROVIDER_SITE_OTHER): Payer: Medicare Other | Admitting: Orthopaedic Surgery

## 2020-11-09 ENCOUNTER — Telehealth: Payer: Self-pay | Admitting: Orthopaedic Surgery

## 2020-11-09 ENCOUNTER — Encounter: Payer: Self-pay | Admitting: Orthopaedic Surgery

## 2020-11-09 ENCOUNTER — Ambulatory Visit: Payer: Self-pay

## 2020-11-09 ENCOUNTER — Other Ambulatory Visit: Payer: Self-pay

## 2020-11-09 VITALS — Ht 65.0 in | Wt 208.0 lb

## 2020-11-09 DIAGNOSIS — M25562 Pain in left knee: Secondary | ICD-10-CM

## 2020-11-09 DIAGNOSIS — M25572 Pain in left ankle and joints of left foot: Secondary | ICD-10-CM

## 2020-11-09 MED ORDER — PREDNISONE 10 MG (21) PO TBPK
ORAL_TABLET | ORAL | 0 refills | Status: DC
  Filled 2020-11-09 – 2020-11-20 (×2): qty 21, 6d supply, fill #0

## 2020-11-09 NOTE — Telephone Encounter (Signed)
yes

## 2020-11-09 NOTE — Progress Notes (Signed)
Office Visit Note   Patient: Karen Ferguson           Date of Birth: 11-22-1955           MRN: [id number] Visit Date: 11/09/2020              Requested by: Haywood Pao, MD [address] [place],  [place] [address] PCP: Haywood Pao, MD   Assessment & Plan: Visit Diagnoses:  1. Pain in left ankle and joints of left foot   2. Acute pain of left knee     Plan: Impression is left ankle sprain, left knee contusion and low back strain. We prescribed a prednisone taper and put her in an ASO ankle brace and hinged knee brace. She can continue to use the crutch for comfort putting weight on the left side as tolerated. She should use this on the right side. We will see her back as needed.   Follow-Up Instructions: Return if symptoms worsen or fail to improve.   Orders:  Orders Placed This Encounter  Procedures   XR Ankle Complete Left   Meds ordered this encounter  Medications   predniSONE (STERAPRED UNI-PAK 21 TAB) 10 MG (21) TBPK tablet    Sig: Take as directed    Dispense:  21 tablet    Refill:  0      Procedures: No procedures performed   Clinical Data: No additional findings.   Subjective: Chief Complaint  Patient presents with   Left Knee - Pain, Injury    DOI 11/02/2020    HPI Karen Ferguson is a 65 y.o. female who presents for evaluation of left knee pain, left ankle pain and low back pain following a fall on 11/02/20. She is unsure exactly how she fell but believes she slipped and ended up on her back. She was seen in the ED where they did hip and knee x-rays which were negative. She has been using a crutch to offload the left side since the injury. She describes medial ankle pain and swelling, anterior knee pain and left-sided low back pain. Pain is worse with weightbearing on the left but she is able to put weight on it. She has been taking Naproxen but doesn't like this because it makes her sleepy. No radicular pain, numbness or tinging in the left lower  extremity. She denies having any issues with these joints prior to this injury.   Review of Systems Review of Systems was reviewed and negative unless as stated in HPI.   Objective: Vital Signs: Ht 5\' 5"  (1.651 m)   Wt 208 lb (94.3 kg)   BMI 34.61 kg/m   Physical Exam  Ortho Exam Left ankle exam shows soft tissue swelling in the medial ankle, no ecchymosis or erythema. She is tender over the medial and lateral malleoli. No tenderness at 5th metatarsal base. Dorsiflexion and plantar flexion are nonpainful and within normal limits. She has some pain with inversion and eversion. Pulses intact. Sensation intact.   Left knee exam shows no effusion or ecchymosis. ROM 0-120 degrees. She is tender over the apex of the patella. No medial or lateral joint line tenderness. Stable varus and valgus stress. Distal neurovascular exam is intact.   Lumbar spine exam shows no ecchymosis or deformity. No midline tenderness or step offs. Tender in the left paraspinal and gluteal region. No focal sensory or motor deficits.   Specialty Comments:  No specialty comments available.  Imaging: Radiographs of the left ankle dated  11/09/20 are normal. No acute injury. Radiographs of the left knee and hip taken in the ED were also reviewed and showed mild degenerative changes but no acute injury.   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   Degenerative disc disease    Asthma exacerbation 06/10/2011   Obesity 10/17/2010   DENTAL CARIES 12/15/2008   OSTEOARTHRITIS,  GENERALIZED, MULTIPLE JOINTS 12/15/2008   Essential hypertension 09/18/2007   OVERACTIVE BLADDER 04/29/2007   RECTAL BLEEDING 09/23/2006   Past Medical History:  Diagnosis Date   Arthritis    Asthma    Albuterol prn   Bronchitis    Degenerative disc disease    Depression    Diabetes mellitus without complication ([place])    Borderline diabetic in the past per pt   History of echocardiogram    Echo 1/18: EF 60-65, no RWMA, Gr 1 DD, PASP 20   Hypertension    PVC's (premature ventricular contractions)    Holter 12/17: NSR, occ PAC/PVCs, no AFib; one 4 beat run NSVT    Family History  Problem Relation Age of Onset   Arthritis Mother    Hypertension Mother    Diabetes Mother    Kidney disease Mother    Arrhythmia Mother        AFib   Arthritis Sister    Heart disease Sister    Hypertension Brother    Heart disease Maternal Aunt    Heart failure Maternal Aunt    Heart disease Maternal Uncle    Cancer Maternal Uncle    Heart disease Maternal Grandfather    Heart attack Maternal Grandfather 89       MI    Past Surgical History:  Procedure Laterality Date   ABDOMINAL HYSTERECTOMY  1988   for fibroid tumors   CARPAL TUNNEL RELEASE  1985   rt   [place] ARTHROSCOPY Right    TENDON REPAIR Right 03/03/2013   Procedure: RIGHT DEBRIDEMENT AND TENOLYSIS OF PERONEOUS LONGOUS AND BREVIS TENDONS ;  Surgeon: Wylene Simmer, MD;  Location: [place];  Service: Orthopedics;  Laterality: Right;   TOOTH EXTRACTION N/A 03/25/2019   Procedure: DENTAL EXTRACTIONS TEETH NUMBER THREE, FOUR, FIVE, SIX, SEVEN, NINE, TWELVE, FOURTEEN, THIRTY AND ALVEOLOPLASTY;  Surgeon: Diona Browner, DDS;  Location: [place];  Service: Oral Surgery;  Laterality: N/A;   Social History   Occupational History   Not on file  Tobacco Use   Smoking status: Never   Smokeless tobacco: Never  Vaping Use   Vaping Use: Never used  Substance and Sexual Activity    Alcohol use: Yes    Comment: occasional   Drug use: No   Sexual activity: Not Currently

## 2020-11-09 NOTE — Telephone Encounter (Signed)
Emailed work note and spoke with patient.

## 2020-11-09 NOTE — Telephone Encounter (Signed)
4 weeks

## 2020-11-09 NOTE — Telephone Encounter (Signed)
Pt called and needs that note today is possible to be out of work. Please send it to the email in last message.  CB [phone]

## 2020-11-09 NOTE — Telephone Encounter (Signed)
How long do you want to keep her OOW?

## 2020-11-09 NOTE — Telephone Encounter (Signed)
Pt just left and states she was given a brace and now needs a note to excuse her from work until she can walk properly again as she stands on her feet all day. Pt does have mychart but has difficutly getting into it and asked if the note can be sent to her  email [email or web address]. The best call back number is [phone].

## 2020-11-12 ENCOUNTER — Telehealth: Payer: Self-pay | Admitting: Orthopaedic Surgery

## 2020-11-12 NOTE — Telephone Encounter (Signed)
Emailed. Patient aware.

## 2020-11-12 NOTE — Telephone Encounter (Signed)
Pt called and wants to know is you can send that work note to her daughter because she can't get it off her email. Its [email or web address]  CB [phone]

## 2020-11-19 ENCOUNTER — Other Ambulatory Visit (HOSPITAL_COMMUNITY): Payer: Self-pay

## 2020-11-20 ENCOUNTER — Telehealth: Payer: Self-pay

## 2020-11-20 ENCOUNTER — Other Ambulatory Visit (HOSPITAL_COMMUNITY): Payer: Self-pay

## 2020-11-20 MED FILL — Valsartan-Hydrochlorothiazide Tab 80-12.5 MG: ORAL | 90 days supply | Qty: 90 | Fill #0 | Status: AC

## 2020-11-20 MED FILL — Linaclotide Cap 290 MCG: ORAL | 90 days supply | Qty: 90 | Fill #0 | Status: AC

## 2020-11-20 MED FILL — Albuterol Sulfate Inhal Aero 108 MCG/ACT (90MCG Base Equiv): RESPIRATORY_TRACT | 25 days supply | Qty: 8.5 | Fill #0 | Status: AC

## 2020-11-20 NOTE — Telephone Encounter (Signed)
Mary from the [facility] called she is requesting office visit notes from 6/17 [phone] call back:[phone] extension:[phone]

## 2020-11-21 ENCOUNTER — Other Ambulatory Visit (HOSPITAL_COMMUNITY): Payer: Self-pay

## 2020-11-21 MED ORDER — PANTOPRAZOLE SODIUM 40 MG PO TBEC
40.0000 mg | DELAYED_RELEASE_TABLET | Freq: Two times a day (BID) | ORAL | 0 refills | Status: DC
  Filled 2020-11-21: qty 180, 90d supply, fill #0

## 2020-11-21 NOTE — Telephone Encounter (Signed)
FAXED

## 2020-12-03 ENCOUNTER — Telehealth: Payer: Self-pay | Admitting: Orthopaedic Surgery

## 2020-12-03 NOTE — Telephone Encounter (Signed)
Probably needs to follow up in the office.  Thanks.

## 2020-12-03 NOTE — Telephone Encounter (Signed)
Pt called stating she was supposed to go back to work 12/07/20 but her knee has still been giving her pain. Pt would like a CB to discuss what her work status will be.   [phone]

## 2020-12-03 NOTE — Telephone Encounter (Signed)
Called patient and she states Leg Is still swollen and painful. Would like work note with restrictions. States she works at [employer].

## 2020-12-03 NOTE — Telephone Encounter (Signed)
APPT MADE

## 2020-12-06 ENCOUNTER — Encounter: Payer: Self-pay | Admitting: Orthopaedic Surgery

## 2020-12-06 ENCOUNTER — Other Ambulatory Visit: Payer: Self-pay

## 2020-12-06 ENCOUNTER — Ambulatory Visit (INDEPENDENT_AMBULATORY_CARE_PROVIDER_SITE_OTHER): Payer: Medicare Other | Admitting: Orthopaedic Surgery

## 2020-12-06 VITALS — Ht 65.0 in | Wt 208.0 lb

## 2020-12-06 DIAGNOSIS — G8929 Other chronic pain: Secondary | ICD-10-CM | POA: Diagnosis not present

## 2020-12-06 DIAGNOSIS — M25572 Pain in left ankle and joints of left foot: Secondary | ICD-10-CM

## 2020-12-06 DIAGNOSIS — M25562 Pain in left knee: Secondary | ICD-10-CM | POA: Diagnosis not present

## 2020-12-06 NOTE — Progress Notes (Signed)
Office Visit Note   Patient: Karen Ferguson           Date of Birth: [birth year]           MRN: [id number] Visit Date: 12/06/2020              Requested by: Haywood Pao, MD [address] [place],  [place] [address] PCP: Haywood Pao, MD   Assessment & Plan: Visit Diagnoses:  1. Pain in left ankle and joints of left foot   2. Chronic pain of left knee     Plan: Impression is resolving left knee contusion left ankle sprain.  Both are continuing to improve so I believe it would be okay to let her return to work full duty starting tomorrow.  She will let us know if she is not able to tolerate this.  We have provided her with samples of Pennsaid as well as Voltaren handout.  She will follow-up with [place] in 6 to 8 weeks if her symptoms have not continued to improve.  Call with concerns or questions in the meantime.  Follow-Up Instructions: Return if symptoms worsen or fail to improve.   Orders:  No orders of the defined types were placed in this encounter.  No orders of the defined types were placed in this encounter.     Procedures: No procedures performed   Clinical Data: No additional findings.   Subjective: Chief Complaint  Patient presents with   Left Ankle - Follow-up   Left Knee - Follow-up    HPI patient is a pleasant 65 year old female who comes in today for follow-up of her left ankle sprain and left knee contusion.  She is sustained a mechanical fall on 11/02/2020.  She was seen in our office shortly after.  She was provided with a knee brace in addition to an ASO brace for her left ankle.  She notes that her pain is improved, but still notes soreness primarily to the knee.  Worse with activity.  She has been taking Tylenol with mild relief of symptoms.  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  Review of Systems as detailed in HPI.  All others reviewed and are negative.   Objective: Vital Signs: Ht 5\' 5"   (1.651 m)   Wt 208 lb (94.3 kg)   BMI 34.61 kg/m   Physical Exam well-developed well-nourished female in no acute distress.  Alert and oriented x3.  Ortho Exam left knee exam shows mild diffuse tenderness.  Range of motion 0 to 95 degrees.  Left ankle exam shows full, painless range of motion.  No tenderness.  She is neurovascular intact distally.  Specialty Comments:  No specialty comments available.  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   Degenerative disc disease    Asthma exacerbation 06/10/2011   Obesity 10/17/2010   DENTAL CARIES 12/15/2008   OSTEOARTHRITIS, GENERALIZED, MULTIPLE JOINTS 12/15/2008   Essential hypertension 09/18/2007   OVERACTIVE BLADDER 04/29/2007   RECTAL BLEEDING 09/23/2006  Past Medical History:  Diagnosis Date   Arthritis    Asthma    Albuterol prn   Bronchitis    Degenerative disc disease    Depression    Diabetes mellitus without complication (HCC)    Borderline diabetic in the past per pt   History of echocardiogram    Echo 1/18: EF 60-65, no RWMA, Gr 1 DD, PASP 20   Hypertension    PVC's (premature ventricular contractions)    Holter 12/17: NSR, occ PAC/PVCs, no AFib; one 4 beat run NSVT    Family History  Problem Relation Age of Onset   Arthritis Mother    Hypertension Mother    Diabetes Mother    Kidney disease Mother    Arrhythmia Mother        AFib   Arthritis Sister    Heart disease Sister    Hypertension Brother    Heart disease Maternal Aunt    Heart  failure Maternal Aunt    Heart disease Maternal Uncle    Cancer Maternal Uncle    Heart disease Maternal Grandfather    Heart attack Maternal Grandfather 71       MI    Past Surgical History:  Procedure Laterality Date   ABDOMINAL HYSTERECTOMY  1988   for fibroid tumors   CARPAL TUNNEL RELEASE  1985   rt   [place] ARTHROSCOPY Right    TENDON REPAIR Right 03/03/2013   Procedure: RIGHT DEBRIDEMENT AND TENOLYSIS OF PERONEOUS LONGOUS AND BREVIS TENDONS ;  Surgeon: Wylene Simmer, MD;  Location: [facility];  Service: Orthopedics;  Laterality: Right;   TOOTH EXTRACTION N/A 03/25/2019   Procedure: DENTAL EXTRACTIONS TEETH NUMBER THREE, FOUR, FIVE, SIX, SEVEN, NINE, TWELVE, FOURTEEN, THIRTY AND ALVEOLOPLASTY;  Surgeon: Diona Browner, DDS;  Location: [place];  Service: Oral Surgery;  Laterality: N/A;   Social History   Occupational History   Not on file  Tobacco Use   Smoking status: Never   Smokeless tobacco: Never  Vaping Use   Vaping Use: Never used  Substance and Sexual Activity   Alcohol use: Yes    Comment: occasional   Drug use: No   Sexual activity: Not Currently

## 2021-02-20 ENCOUNTER — Other Ambulatory Visit: Payer: Self-pay

## 2021-03-04 ENCOUNTER — Other Ambulatory Visit (HOSPITAL_COMMUNITY): Payer: Self-pay

## 2021-03-05 ENCOUNTER — Other Ambulatory Visit (HOSPITAL_BASED_OUTPATIENT_CLINIC_OR_DEPARTMENT_OTHER): Payer: Self-pay

## 2021-03-11 ENCOUNTER — Other Ambulatory Visit (HOSPITAL_COMMUNITY): Payer: Self-pay

## 2021-03-18 ENCOUNTER — Other Ambulatory Visit (HOSPITAL_COMMUNITY): Payer: Self-pay

## 2021-03-18 ENCOUNTER — Other Ambulatory Visit: Payer: Self-pay

## 2021-03-18 MED FILL — Linaclotide Cap 290 MCG: ORAL | 90 days supply | Qty: 90 | Fill #1 | Status: CN

## 2021-03-18 MED FILL — Valsartan-Hydrochlorothiazide Tab 80-12.5 MG: ORAL | 90 days supply | Qty: 90 | Fill #1 | Status: CN

## 2021-03-19 ENCOUNTER — Other Ambulatory Visit (HOSPITAL_COMMUNITY): Payer: Self-pay

## 2021-03-19 ENCOUNTER — Telehealth: Payer: Self-pay | Admitting: Neurology

## 2021-03-19 NOTE — Telephone Encounter (Signed)
Dr. Posey Pronto did not Rx the Gabapentin, who prescribed it for her, pls have her contact them for refills, thanks

## 2021-03-19 NOTE — Telephone Encounter (Signed)
Pt states that she needs a refill on her Gabpentin  called into the [facility] outpatient  pharmacy

## 2021-03-25 NOTE — Telephone Encounter (Signed)
Called patient and number not in service.

## 2021-03-26 ENCOUNTER — Other Ambulatory Visit (HOSPITAL_COMMUNITY): Payer: Self-pay

## 2021-03-28 ENCOUNTER — Other Ambulatory Visit (HOSPITAL_COMMUNITY): Payer: Self-pay

## 2021-04-01 ENCOUNTER — Other Ambulatory Visit (HOSPITAL_COMMUNITY): Payer: Self-pay

## 2021-04-01 MED FILL — Valsartan-Hydrochlorothiazide Tab 80-12.5 MG: ORAL | 90 days supply | Qty: 90 | Fill #1 | Status: AC

## 2021-04-01 MED FILL — Linaclotide Cap 290 MCG: ORAL | 90 days supply | Qty: 90 | Fill #1 | Status: AC

## 2021-05-10 ENCOUNTER — Other Ambulatory Visit (HOSPITAL_COMMUNITY): Payer: Self-pay

## 2021-05-10 MED ORDER — OMEPRAZOLE 20 MG PO CPDR
20.0000 mg | DELAYED_RELEASE_CAPSULE | Freq: Every morning | ORAL | 0 refills | Status: DC
  Filled 2021-05-10: qty 30, 30d supply, fill #0

## 2021-05-26 ENCOUNTER — Emergency Department (HOSPITAL_COMMUNITY): Payer: Medicare Other

## 2021-05-26 ENCOUNTER — Encounter (HOSPITAL_COMMUNITY): Payer: Self-pay | Admitting: *Deleted

## 2021-05-26 ENCOUNTER — Other Ambulatory Visit: Payer: Self-pay

## 2021-05-26 ENCOUNTER — Inpatient Hospital Stay (HOSPITAL_COMMUNITY)
Admission: EM | Admit: 2021-05-26 | Discharge: 2021-05-30 | DRG: 308 | Disposition: A | Discharge status: Home / Self Care | Payer: Medicare Other | Attending: Internal Medicine | Admitting: Internal Medicine

## 2021-05-26 DIAGNOSIS — N179 Acute kidney failure, unspecified: Secondary | ICD-10-CM | POA: Diagnosis present

## 2021-05-26 DIAGNOSIS — K859 Acute pancreatitis without necrosis or infection, unspecified: Secondary | ICD-10-CM

## 2021-05-26 DIAGNOSIS — J45909 Unspecified asthma, uncomplicated: Secondary | ICD-10-CM

## 2021-05-26 DIAGNOSIS — K219 Gastro-esophageal reflux disease without esophagitis: Secondary | ICD-10-CM | POA: Diagnosis present

## 2021-05-26 DIAGNOSIS — Z841 Family history of disorders of kidney and ureter: Secondary | ICD-10-CM

## 2021-05-26 DIAGNOSIS — I4891 Unspecified atrial fibrillation: Principal | ICD-10-CM | POA: Diagnosis present

## 2021-05-26 DIAGNOSIS — Z8249 Family history of ischemic heart disease and other diseases of the circulatory system: Secondary | ICD-10-CM

## 2021-05-26 DIAGNOSIS — U071 COVID-19: Secondary | ICD-10-CM | POA: Diagnosis not present

## 2021-05-26 DIAGNOSIS — G4733 Obstructive sleep apnea (adult) (pediatric): Secondary | ICD-10-CM | POA: Diagnosis present

## 2021-05-26 DIAGNOSIS — R7989 Other specified abnormal findings of blood chemistry: Secondary | ICD-10-CM | POA: Diagnosis present

## 2021-05-26 DIAGNOSIS — Z833 Family history of diabetes mellitus: Secondary | ICD-10-CM

## 2021-05-26 DIAGNOSIS — R109 Unspecified abdominal pain: Secondary | ICD-10-CM | POA: Diagnosis not present

## 2021-05-26 DIAGNOSIS — I10 Essential (primary) hypertension: Secondary | ICD-10-CM | POA: Diagnosis present

## 2021-05-26 DIAGNOSIS — J45901 Unspecified asthma with (acute) exacerbation: Secondary | ICD-10-CM | POA: Diagnosis present

## 2021-05-26 DIAGNOSIS — N289 Disorder of kidney and ureter, unspecified: Secondary | ICD-10-CM

## 2021-05-26 DIAGNOSIS — G8929 Other chronic pain: Secondary | ICD-10-CM | POA: Diagnosis present

## 2021-05-26 DIAGNOSIS — R002 Palpitations: Secondary | ICD-10-CM | POA: Diagnosis present

## 2021-05-26 DIAGNOSIS — Z8261 Family history of arthritis: Secondary | ICD-10-CM

## 2021-05-26 DIAGNOSIS — Z91013 Allergy to seafood: Secondary | ICD-10-CM

## 2021-05-26 DIAGNOSIS — R748 Abnormal levels of other serum enzymes: Secondary | ICD-10-CM

## 2021-05-26 DIAGNOSIS — E0590 Thyrotoxicosis, unspecified without thyrotoxic crisis or storm: Secondary | ICD-10-CM | POA: Diagnosis present

## 2021-05-26 DIAGNOSIS — Z79899 Other long term (current) drug therapy: Secondary | ICD-10-CM

## 2021-05-26 LAB — CBC WITH DIFFERENTIAL/PLATELET
Abs Immature Granulocytes: 0.03 10*3/uL (ref 0.00–0.07)
Basophils Absolute: 0 10*3/uL (ref 0.0–0.1)
Basophils Relative: 0 %
Eosinophils Absolute: 0.2 10*3/uL (ref 0.0–0.5)
Eosinophils Relative: 3 %
HCT: 33.6 % — ABNORMAL LOW (ref 36.0–46.0)
Hemoglobin: 11.2 g/dL — ABNORMAL LOW (ref 12.0–15.0)
Immature Granulocytes: 0 %
Lymphocytes Relative: 31 %
Lymphs Abs: 2.1 10*3/uL (ref 0.7–4.0)
MCH: 31.8 pg (ref 26.0–34.0)
MCHC: 33.3 g/dL (ref 30.0–36.0)
MCV: 95.5 fL (ref 80.0–100.0)
Monocytes Absolute: 0.7 10*3/uL (ref 0.1–1.0)
Monocytes Relative: 10 %
Neutro Abs: 3.8 10*3/uL (ref 1.7–7.7)
Neutrophils Relative %: 56 %
Platelets: 255 10*3/uL (ref 150–400)
RBC: 3.52 MIL/uL — ABNORMAL LOW (ref 3.87–5.11)
RDW: 11.8 % (ref 11.5–15.5)
WBC: 6.8 10*3/uL (ref 4.0–10.5)
nRBC: 0 % (ref 0.0–0.2)

## 2021-05-26 LAB — COMPREHENSIVE METABOLIC PANEL
ALT: 27 U/L (ref 0–44)
AST: 24 U/L (ref 15–41)
Albumin: 3.2 g/dL — ABNORMAL LOW (ref 3.5–5.0)
Alkaline Phosphatase: 69 U/L (ref 38–126)
Anion gap: 8 (ref 5–15)
BUN: 26 mg/dL — ABNORMAL HIGH (ref 8–23)
CO2: 26 mmol/L (ref 22–32)
Calcium: 10.3 mg/dL (ref 8.9–10.3)
Chloride: 104 mmol/L (ref 98–111)
Creatinine, Ser: 1.4 mg/dL — ABNORMAL HIGH (ref 0.44–1.00)
GFR, Estimated: 41 mL/min — ABNORMAL LOW (ref >60)
Glucose, Bld: 131 mg/dL — ABNORMAL HIGH (ref 70–99)
Potassium: 3.5 mmol/L (ref 3.5–5.1)
Sodium: 138 mmol/L (ref 135–145)
Total Bilirubin: 0.5 mg/dL (ref 0.3–1.2)
Total Protein: 6.7 g/dL (ref 6.5–8.1)

## 2021-05-26 LAB — LIPASE, BLOOD: Lipase: 70 U/L — ABNORMAL HIGH (ref 11–51)

## 2021-05-26 LAB — URINALYSIS, ROUTINE W REFLEX MICROSCOPIC
Bilirubin Urine: NEGATIVE
Glucose, UA: NEGATIVE mg/dL
Hgb urine dipstick: NEGATIVE
Ketones, ur: NEGATIVE mg/dL
Nitrite: NEGATIVE
Protein, ur: NEGATIVE mg/dL
Specific Gravity, Urine: 1.016 (ref 1.005–1.030)
pH: 5 (ref 5.0–8.0)

## 2021-05-26 LAB — TROPONIN I (HIGH SENSITIVITY): Troponin I (High Sensitivity): 11 ng/L (ref <18)

## 2021-05-26 MED ORDER — SODIUM CHLORIDE 0.9 % IV BOLUS (SEPSIS)
1000.0000 mL | Freq: Once | INTRAVENOUS | Status: AC
  Administered 2021-05-26: 1000 mL via INTRAVENOUS

## 2021-05-26 MED ORDER — MORPHINE SULFATE (PF) 4 MG/ML IV SOLN
4.0000 mg | Freq: Once | INTRAVENOUS | Status: AC
  Administered 2021-05-26: 4 mg via INTRAVENOUS
  Filled 2021-05-26: qty 1

## 2021-05-26 MED ORDER — DILTIAZEM HCL-DEXTROSE 125-5 MG/125ML-% IV SOLN (PREMIX)
5.0000 mg/h | INTRAVENOUS | Status: DC
  Administered 2021-05-26: 5 mg/h via INTRAVENOUS
  Filled 2021-05-26: qty 125

## 2021-05-26 MED ORDER — SODIUM CHLORIDE 0.9 % IV SOLN
1000.0000 mL | INTRAVENOUS | Status: DC
  Administered 2021-05-26: 1000 mL via INTRAVENOUS

## 2021-05-26 MED ORDER — APIXABAN 5 MG PO TABS
5.0000 mg | ORAL_TABLET | Freq: Two times a day (BID) | ORAL | Status: DC
  Administered 2021-05-26 – 2021-05-30 (×8): 5 mg via ORAL
  Filled 2021-05-26 (×8): qty 1

## 2021-05-26 MED ORDER — METOPROLOL TARTRATE 5 MG/5ML IV SOLN
10.0000 mg | Freq: Once | INTRAVENOUS | Status: AC
  Administered 2021-05-26: 10 mg via INTRAVENOUS
  Filled 2021-05-26: qty 10

## 2021-05-26 MED ORDER — DILTIAZEM LOAD VIA INFUSION
20.0000 mg | Freq: Once | INTRAVENOUS | Status: AC
  Administered 2021-05-26: 20 mg via INTRAVENOUS
  Filled 2021-05-26: qty 20

## 2021-05-26 MED ORDER — ONDANSETRON HCL 4 MG/2ML IJ SOLN
4.0000 mg | Freq: Once | INTRAMUSCULAR | Status: AC
  Administered 2021-05-26: 4 mg via INTRAVENOUS
  Filled 2021-05-26: qty 2

## 2021-05-26 MED ORDER — IOHEXOL 300 MG/ML  SOLN
80.0000 mL | Freq: Once | INTRAMUSCULAR | Status: AC | PRN
  Administered 2021-05-26: 80 mL via INTRAVENOUS

## 2021-05-26 NOTE — ED Triage Notes (Signed)
Abdominal pain, bloating says pain "moves all around". Hx of constipation issues.

## 2021-05-26 NOTE — ED Provider Notes (Signed)
[facility] EMERGENCY DEPARTMENT Provider Note   CSN: [id number] Arrival date & time: 05/26/21  1859     History  Chief Complaint  Patient presents with   Abdominal Pain    Karen Ferguson is a 66 y.o. female.   Abdominal Pain Associated symptoms: no chest pain and no shortness of breath    Patient presented to the ED for evaluation of abdominal pain.  Patient states she started having symptoms this evening.  The pain is in her upper abdomen and moving to both sides.  It started out of the blue and was intense.  Patient states she does have a history of recurrent abdominal pain issues.  She has had it most of her life and she has seen a gastroenterologist in the past for this.  Denies any prior abdominal surgeries.  She has not had any issues with nausea vomiting.  No dysuria.  No diarrhea or constipation  Home Medications Prior to Admission medications   Medication Sig Start Date End Date Taking? Authorizing Provider  albuterol (VENTOLIN HFA) 108 (90 Base) MCG/ACT inhaler Inhale 1-2 puffs into the lungs every 6 (six) hours as needed for wheezing or shortness of breath.    [provider]  albuterol (VENTOLIN HFA) 108 (90 Base) MCG/ACT inhaler INHALE 2 PUFFS INTO THE LUNGS FOUR TIMES DAILY AS NEEDED FOR SHORTNESS OF BREATH. 06/21/20   Tisovec, Fransico Him, MD  albuterol (VENTOLIN HFA) 108 (90 Base) MCG/ACT inhaler INHALE 2 PUFFS BY MOUTH 4 TIMES A DAY AS NEEDED FOR SHORTNESS OF BREATH 09/05/19 09/04/20  Tisovec, Fransico Him, MD  amLODipine (NORVASC) 5 MG tablet Take 1 tablet (5 mg total) by mouth daily. 12/28/15   Glenis Smoker, MD  amoxicillin (AMOXIL) 500 MG capsule Take 1 capsule (500 mg total) by mouth 3 (three) times daily. 03/25/19   Diona Browner, DMD  Cholecalciferol (VITAMIN D3) 1.25 MG (50000 UT) CAPS TAKE 1 CAPSULE BY MOUTH ONCE A WEEK. 06/21/20 06/21/21  Tisovec, Fransico Him, MD  diphenhydrAMINE (BENADRYL) 25 mg capsule Take 25 mg by mouth every 6 (six)  hours as needed.    [provider]  linaclotide (LINZESS) 290 MCG CAPS capsule TAKE 1 CAPSULE BY MOUTH AT LEAST 30 MINUTES BEFORE THE FIRST MEAL OF THE DAY ON AN EMPTY STOMACH 04/13/20   Ronnette Juniper, MD  methocarbamol (ROBAXIN) 500 MG tablet Take 1 tablet (500 mg total) by mouth 2 (two) times daily. Patient taking differently: Take 500 mg by mouth 3 (three) times daily as needed for muscle spasms. 12/07/16   Junius Creamer, NP  naproxen (NAPROSYN) 500 MG tablet Take 1 tablet (500 mg total) by mouth 2 (two) times daily. 11/03/20   Pearson Forster, NP  omeprazole (PRILOSEC) 20 MG capsule Take 1 capsule (20 mg total) by mouth every morning 30 minutes before a meal. 05/10/21     oxyCODONE-acetaminophen (PERCOCET) 5-325 MG tablet Take 1 tablet by mouth every 4 (four) hours as needed. 03/25/19   Diona Browner, DMD  pantoprazole (PROTONIX) 20 MG tablet Take 40 mg by mouth daily.  02/16/19   [provider]  pantoprazole (PROTONIX) 40 MG tablet Take 1 tablet (40 mg total) by mouth 2 (two) times daily. 01/09/20     predniSONE (STERAPRED UNI-PAK 21 TAB) 10 MG (21) TBPK tablet Take as directed 11/09/20   Leandrew Koyanagi, MD  valsartan-hydrochlorothiazide (DIOVAN-HCT) 80-12.5 MG tablet Take 1 tablet by mouth daily.  07/02/18   [provider]  valsartan-hydrochlorothiazide (DIOVAN-HCT)  80-12.5 MG tablet TAKE 1 TABLET BY MOUTH DAILY 08/13/20 08/13/21  Tisovec, Fransico Him, MD      Allergies    Shellfish allergy    Review of Systems   Review of Systems  Respiratory:  Negative for shortness of breath.   Cardiovascular:  Negative for chest pain.  Gastrointestinal:  Positive for abdominal pain.   Physical Exam Updated Vital Signs BP 96/63 (BP Location: Right Arm)    Pulse 99    Temp 98.4 F (36.9 C) (Oral)    Resp 17    SpO2 97%  Physical Exam Vitals and nursing note reviewed.  Constitutional:      General: She is not in acute distress.    Appearance: She is well-developed.  HENT:      Head: Normocephalic and atraumatic.     Right Ear: External ear normal.     Left Ear: External ear normal.  Eyes:     General: No scleral icterus.       Right eye: No discharge.        Left eye: No discharge.     Conjunctiva/sclera: Conjunctivae normal.  Neck:     Trachea: No tracheal deviation.  Cardiovascular:     Rate and Rhythm: Normal rate and regular rhythm.  Pulmonary:     Effort: Pulmonary effort is normal. No respiratory distress.     Breath sounds: Normal breath sounds. No stridor. No wheezing or rales.  Abdominal:     General: Bowel sounds are normal. There is no distension.     Palpations: Abdomen is soft.     Tenderness: There is abdominal tenderness in the epigastric area. There is no guarding or rebound.  Musculoskeletal:        General: No tenderness or deformity.     Cervical back: Neck supple.  Skin:    General: Skin is warm and dry.     Findings: No rash.  Neurological:     General: No focal deficit present.     Mental Status: She is alert.     Cranial Nerves: No cranial nerve deficit (no facial droop, extraocular movements intact, no slurred speech).     Sensory: No sensory deficit.     Motor: No abnormal muscle tone or seizure activity.     Coordination: Coordination normal.  Psychiatric:        Mood and Affect: Mood normal.    ED Results / Procedures / Treatments   Labs (all labs ordered are listed, but only abnormal results are displayed) Labs Reviewed  CBC WITH DIFFERENTIAL/PLATELET - Abnormal; Notable for the following components:      Result Value   RBC 3.52 (*)    Hemoglobin 11.2 (*)    HCT 33.6 (*)    All other components within normal limits  COMPREHENSIVE METABOLIC PANEL - Abnormal; Notable for the following components:   Glucose, Bld 131 (*)    BUN 26 (*)    Creatinine, Ser 1.40 (*)    Albumin 3.2 (*)    GFR, Estimated 41 (*)    All other components within normal limits  LIPASE, BLOOD - Abnormal; Notable for the following components:    Lipase 70 (*)    All other components within normal limits  URINALYSIS, ROUTINE W REFLEX MICROSCOPIC    EKG EKG Interpretation  Date/Time:  Sunday May 26 2021 20:32:46 EST Ventricular Rate:  88 PR Interval:  158 QRS Duration: 86 QT Interval:  350 QTC Calculation: 424 R Axis:   46 Text  Interpretation: Sinus rhythm Low voltage, precordial leads Abnormal R-wave progression, early transition No significant change since last tracing Confirmed by Dorie Rank [phone]) on 05/26/2021 8:34:37 PM  Radiology No results found.  Procedures Procedures    Medications Ordered in ED Medications  sodium chloride 0.9 % bolus 1,000 mL (has no administration in time range)    Followed by  0.9 %  sodium chloride infusion (has no administration in time range)  ondansetron (ZOFRAN) injection 4 mg (has no administration in time range)  morphine 4 MG/ML injection 4 mg (has no administration in time range)    ED Course/ Medical Decision Making/ A&P Clinical Course as of 05/26/21 2049  Sun May 26, 2021  2047 CBC normal.  Metabolic panel shows slight increase in creatinine.  Lipase increased at 70 [JK]    Clinical Course User Index [JK] Dorie Rank, MD                           Medical Decision Making  Patient presents with abdominal pain.  History of episodes in the past.  On exam primarily having pain in her upper abdomen.  Lipase is elevated.  Concerning for the possibility of pancreatitis.  Also have borderline blood pressures initially.  We will proceed with CT scan for further evaluation.  IV fluids and antiemetics ordered.  Care turned over to oncoming team        Final Clinical Impression(s) / ED Diagnoses Final diagnoses:  Abdominal pain, unspecified abdominal location  Elevated lipase    Rx / DC Orders ED Discharge Orders     None         Dorie Rank, MD 05/26/21 2049

## 2021-05-26 NOTE — ED Provider Notes (Signed)
I received the patient in signout from Dr. Tomi Bamberger.  Briefly the patient is a 66 year old female with a chief complaints of episodic upper abdominal discomfort that radiates to both sides.  Seems to come and go.  Has a history of chronic abdominal pain.  Plan for CT scan of the abdomen pelvis and reassessment.  CT scan is resulted and is unremarkable.  I went to give the patient her results and was notified by the nursing staff that his heart rate had gone into the 130s.  No history of A. fib.  Now in A. fib with RVR.  Patient feels uncomfortable and is having some chest discomfort.  Suspect is likely due to rate but will obtain a troponin.  We will give a bolus dose of metoprolol.  Reassess.  2 doses of metoprolol without significant change.  As patient was reporting some abdominal discomfort off and on for some time and not sure of the exact onset of her symptoms and would be a bit apprehensive to try and cardiovert here in the ER.  We will started on a diltiazem drip.  Will discuss with medicine.   Deno Etienne, DO 05/26/21 2325

## 2021-05-26 NOTE — ED Notes (Signed)
Patient transported to CT 

## 2021-05-27 ENCOUNTER — Encounter (HOSPITAL_COMMUNITY): Payer: Self-pay | Admitting: Family Medicine

## 2021-05-27 DIAGNOSIS — I4891 Unspecified atrial fibrillation: Secondary | ICD-10-CM | POA: Diagnosis present

## 2021-05-27 DIAGNOSIS — J45901 Unspecified asthma with (acute) exacerbation: Secondary | ICD-10-CM | POA: Diagnosis present

## 2021-05-27 DIAGNOSIS — K859 Acute pancreatitis without necrosis or infection, unspecified: Secondary | ICD-10-CM | POA: Insufficient documentation

## 2021-05-27 DIAGNOSIS — U071 COVID-19: Secondary | ICD-10-CM | POA: Diagnosis present

## 2021-05-27 DIAGNOSIS — Z8261 Family history of arthritis: Secondary | ICD-10-CM | POA: Diagnosis not present

## 2021-05-27 DIAGNOSIS — R109 Unspecified abdominal pain: Secondary | ICD-10-CM | POA: Diagnosis present

## 2021-05-27 DIAGNOSIS — N179 Acute kidney failure, unspecified: Secondary | ICD-10-CM | POA: Diagnosis present

## 2021-05-27 DIAGNOSIS — K219 Gastro-esophageal reflux disease without esophagitis: Secondary | ICD-10-CM | POA: Diagnosis present

## 2021-05-27 DIAGNOSIS — G4733 Obstructive sleep apnea (adult) (pediatric): Secondary | ICD-10-CM | POA: Diagnosis present

## 2021-05-27 DIAGNOSIS — Z91013 Allergy to seafood: Secondary | ICD-10-CM | POA: Diagnosis not present

## 2021-05-27 DIAGNOSIS — G8929 Other chronic pain: Secondary | ICD-10-CM | POA: Diagnosis present

## 2021-05-27 DIAGNOSIS — I10 Essential (primary) hypertension: Secondary | ICD-10-CM | POA: Diagnosis present

## 2021-05-27 DIAGNOSIS — Z79899 Other long term (current) drug therapy: Secondary | ICD-10-CM | POA: Diagnosis not present

## 2021-05-27 DIAGNOSIS — Z841 Family history of disorders of kidney and ureter: Secondary | ICD-10-CM | POA: Diagnosis not present

## 2021-05-27 DIAGNOSIS — Z833 Family history of diabetes mellitus: Secondary | ICD-10-CM | POA: Diagnosis not present

## 2021-05-27 DIAGNOSIS — Z8249 Family history of ischemic heart disease and other diseases of the circulatory system: Secondary | ICD-10-CM | POA: Diagnosis not present

## 2021-05-27 DIAGNOSIS — E0590 Thyrotoxicosis, unspecified without thyrotoxic crisis or storm: Secondary | ICD-10-CM | POA: Diagnosis present

## 2021-05-27 DIAGNOSIS — R7989 Other specified abnormal findings of blood chemistry: Secondary | ICD-10-CM | POA: Diagnosis present

## 2021-05-27 LAB — CBC
HCT: 32.5 % — ABNORMAL LOW (ref 36.0–46.0)
Hemoglobin: 10.7 g/dL — ABNORMAL LOW (ref 12.0–15.0)
MCH: 31.5 pg (ref 26.0–34.0)
MCHC: 32.9 g/dL (ref 30.0–36.0)
MCV: 95.6 fL (ref 80.0–100.0)
Platelets: 239 10*3/uL (ref 150–400)
RBC: 3.4 MIL/uL — ABNORMAL LOW (ref 3.87–5.11)
RDW: 11.9 % (ref 11.5–15.5)
WBC: 5.8 10*3/uL (ref 4.0–10.5)
nRBC: 0 % (ref 0.0–0.2)

## 2021-05-27 LAB — COMPREHENSIVE METABOLIC PANEL
ALT: 70 U/L — ABNORMAL HIGH (ref 0–44)
AST: 123 U/L — ABNORMAL HIGH (ref 15–41)
Albumin: 2.8 g/dL — ABNORMAL LOW (ref 3.5–5.0)
Alkaline Phosphatase: 100 U/L (ref 38–126)
Anion gap: 4 — ABNORMAL LOW (ref 5–15)
BUN: 22 mg/dL (ref 8–23)
CO2: 26 mmol/L (ref 22–32)
Calcium: 9.7 mg/dL (ref 8.9–10.3)
Chloride: 108 mmol/L (ref 98–111)
Creatinine, Ser: 1 mg/dL (ref 0.44–1.00)
GFR, Estimated: >60 mL/min (ref >60)
Glucose, Bld: 120 mg/dL — ABNORMAL HIGH (ref 70–99)
Potassium: 3.9 mmol/L (ref 3.5–5.1)
Sodium: 138 mmol/L (ref 135–145)
Total Bilirubin: 0.4 mg/dL (ref 0.3–1.2)
Total Protein: 6.2 g/dL — ABNORMAL LOW (ref 6.5–8.1)

## 2021-05-27 LAB — RESP PANEL BY RT-PCR (FLU A&B, COVID) ARPGX2
Influenza A by PCR: NEGATIVE
Influenza B by PCR: NEGATIVE
SARS Coronavirus 2 by RT PCR: POSITIVE — AB

## 2021-05-27 LAB — HIV ANTIBODY (ROUTINE TESTING W REFLEX): HIV Screen 4th Generation wRfx: NONREACTIVE

## 2021-05-27 LAB — C-REACTIVE PROTEIN: CRP: 1.2 mg/dL — ABNORMAL HIGH (ref <1.0)

## 2021-05-27 LAB — TSH: TSH: <0.01 u[IU]/mL — ABNORMAL LOW (ref 0.350–4.500)

## 2021-05-27 LAB — T4, FREE: Free T4: 2.91 ng/dL — ABNORMAL HIGH (ref 0.61–1.12)

## 2021-05-27 LAB — D-DIMER, QUANTITATIVE: D-Dimer, Quant: 0.96 ug/mL-FEU — ABNORMAL HIGH (ref 0.00–0.50)

## 2021-05-27 LAB — FIBRINOGEN: Fibrinogen: 668 mg/dL — ABNORMAL HIGH (ref 210–475)

## 2021-05-27 LAB — PROCALCITONIN: Procalcitonin: <0.1 ng/mL

## 2021-05-27 LAB — MAGNESIUM: Magnesium: 1.6 mg/dL — ABNORMAL LOW (ref 1.7–2.4)

## 2021-05-27 LAB — FERRITIN: Ferritin: 326 ng/mL — ABNORMAL HIGH (ref 11–307)

## 2021-05-27 LAB — TRIGLYCERIDES: Triglycerides: 49 mg/dL (ref <150)

## 2021-05-27 MED ORDER — HYDROCOD POLST-CPM POLST ER 10-8 MG/5ML PO SUER
5.0000 mL | Freq: Two times a day (BID) | ORAL | Status: DC | PRN
  Administered 2021-05-27 – 2021-05-28 (×2): 5 mL via ORAL
  Filled 2021-05-27 (×2): qty 5

## 2021-05-27 MED ORDER — ALBUTEROL SULFATE (2.5 MG/3ML) 0.083% IN NEBU: 2.5000 mg | INHALATION_SOLUTION | Freq: Four times a day (QID) | RESPIRATORY_TRACT | Status: DC | PRN

## 2021-05-27 MED ORDER — GUAIFENESIN-DM 100-10 MG/5ML PO SYRP
5.0000 mL | ORAL_SOLUTION | ORAL | Status: DC | PRN
  Administered 2021-05-27 – 2021-05-28 (×4): 5 mL via ORAL
  Filled 2021-05-27 (×4): qty 5

## 2021-05-27 MED ORDER — ACETAMINOPHEN 325 MG PO TABS
650.0000 mg | ORAL_TABLET | Freq: Four times a day (QID) | ORAL | Status: DC | PRN
  Administered 2021-05-27 – 2021-05-29 (×2): 650 mg via ORAL
  Filled 2021-05-27 (×2): qty 2

## 2021-05-27 MED ORDER — SODIUM CHLORIDE 0.9 % IV SOLN: INTRAVENOUS | Status: AC

## 2021-05-27 MED ORDER — METOPROLOL TARTRATE 25 MG PO TABS
25.0000 mg | ORAL_TABLET | Freq: Two times a day (BID) | ORAL | Status: DC
  Administered 2021-05-27 – 2021-05-30 (×8): 25 mg via ORAL
  Filled 2021-05-27 (×8): qty 1

## 2021-05-27 MED ORDER — DIPHENHYDRAMINE HCL 25 MG PO CAPS: 25.0000 mg | ORAL_CAPSULE | Freq: Four times a day (QID) | ORAL | Status: DC | PRN

## 2021-05-27 MED ORDER — ONDANSETRON HCL 4 MG/2ML IJ SOLN: 4.0000 mg | Freq: Four times a day (QID) | INTRAMUSCULAR | Status: DC | PRN

## 2021-05-27 MED ORDER — METHOCARBAMOL 500 MG PO TABS: 500.0000 mg | ORAL_TABLET | Freq: Three times a day (TID) | ORAL | Status: DC | PRN

## 2021-05-27 MED ORDER — BISACODYL 5 MG PO TBEC: 5.0000 mg | DELAYED_RELEASE_TABLET | Freq: Every day | ORAL | Status: DC | PRN

## 2021-05-27 MED ORDER — ONDANSETRON HCL 4 MG PO TABS: 4.0000 mg | ORAL_TABLET | Freq: Four times a day (QID) | ORAL | Status: DC | PRN

## 2021-05-27 MED ORDER — PANTOPRAZOLE SODIUM 40 MG PO TBEC
40.0000 mg | DELAYED_RELEASE_TABLET | Freq: Every day | ORAL | Status: DC
  Administered 2021-05-27 – 2021-05-30 (×4): 40 mg via ORAL
  Filled 2021-05-27 (×4): qty 1

## 2021-05-27 MED ORDER — SENNOSIDES-DOCUSATE SODIUM 8.6-50 MG PO TABS: 1.0000 | ORAL_TABLET | Freq: Every evening | ORAL | Status: DC | PRN

## 2021-05-27 MED ORDER — LINACLOTIDE 145 MCG PO CAPS
290.0000 ug | ORAL_CAPSULE | Freq: Every day | ORAL | Status: DC
  Administered 2021-05-27 – 2021-05-30 (×4): 290 ug via ORAL
  Filled 2021-05-27 (×4): qty 2

## 2021-05-27 MED ORDER — SODIUM CHLORIDE 0.9 % IV SOLN: INTRAVENOUS | Status: DC

## 2021-05-27 NOTE — Assessment & Plan Note (Signed)
Due to rapid Afib.  Resolved with HR control.

## 2021-05-27 NOTE — Progress Notes (Signed)
Pt arrived from ED to 4E13 covid positive but in no acute distress.  Telemetry monitor applied and CCMD notified.  CHG bath and skin assessment completed.  Patient oriented to unit and room to include call light and phone.  Will continue to monitor.

## 2021-05-27 NOTE — Assessment & Plan Note (Addendum)
TSH low on admission.  Free T4 elevated. Hyperthyroid chronic vs related to current acute illness.  HR now controlled. Hold of starting methimazole at this time and follow up to consider.

## 2021-05-27 NOTE — Hospital Course (Signed)
[name] is a very pleasant 66 y.o. female with medical history significant for hypertension, asthma, and chronic abdominal discomfort, presented tot he ED on evening of 05/26/21 for evaluation of abdominal pain, more severe than her typical episodes.  No vomiting, diarrhea, fever or chills.   She also reported an episode of intense palpitations a few nights ago that resolved on its own after several minutes. She wore a Holter monitor in 2017 which documented sinus rhythm with PACs and PVCs but no atrial fibrillation or pauses.  In to the ED, vitals were initially normal, but she later became tachycardic to the 130s and was noted to be in atrial fibrillation.  CT abdomen/and pelvis negative for acute findings.  Labs notable for creatinine 1.40 and lipase 70.  COVID PCR positive.  Patient was given a liter of saline, morphine, Zofran, Eliquis, IV Lopressor, and started on diltiazem infusion in the ED.  Admitted for observation to [facility] service.

## 2021-05-27 NOTE — Assessment & Plan Note (Signed)
Pt has hx of chronic abdominal discomfort, presented with worsened abd pain, more intense than usual.  Possibly related to Covid infection vs mild pancreatitis or gastritis. Reassuring that abdominal exam benign and no acute findings on CT.  Lipase slightly elevated.  -- Continue symptomatic management

## 2021-05-27 NOTE — Assessment & Plan Note (Signed)
Presented w Cr 1.40 on admission, up from 0.68 in Oct 2020.  Unclear acuity / recent baseline. --Hold valsartan-HCTZ --Renally-dose medications --Monitor BMP

## 2021-05-27 NOTE — Assessment & Plan Note (Signed)
Continue PPI ?

## 2021-05-27 NOTE — Assessment & Plan Note (Signed)
Unclear if on CPAP currently at home. Follow up.

## 2021-05-27 NOTE — H&P (Signed)
History and Physical    GHINA BITTINGER TIW:[id number] DOB: 10-03-1955 DOA: 05/26/2021  PCP: Haywood Pao, MD   Patient coming from: Home   Chief Complaint: Abdominal pain   HPI: Karen Ferguson is a very pleasant 66 y.o. female with medical history significant for hypertension, asthma, and chronic abdominal discomfort, now presenting to the emergency department for evaluation of abdominal pain.  The patient reports that she has suffered with frequent episodes of abdominal discomfort for many years but the current episode, while same in character, is more severe than usual.  The discomfort started this evening, is generalized but most notable in the upper abdomen, and not associated with vomiting, diarrhea, fever, or chills.  She reports only occasional alcohol use but had several drinks at a [employer].  She denies any chest pain, shortness breath, or wheezing.  She had an episode of intense palpitations a few nights ago while she was trying to sleep that resolved on its own after several minutes.  She wore a Holter monitor in 2017 which documented sinus rhythm with PACs and PVCs but no atrial fibrillation or pauses.  She denies any syncope.  ED Course: Upon arrival to the ED, patient is found to be afebrile, saturating well on room air, and with normal heart rate and blood pressure initially.  She later became tachycardic to the 130s and was noted to be in atrial fibrillation.  CT of the abdomen and pelvis is negative for acute findings.  Blood work notable for creatinine 1.40 and lipase 70.  COVID PCR returned positive.  Patient was given a liter of saline, morphine, Zofran, Eliquis, IV Lopressor, and started on diltiazem infusion in the ED.  Review of Systems:  All other systems reviewed and apart from HPI, are negative.  Past Medical History:  Diagnosis Date   Arthritis    Asthma    Albuterol prn   Bronchitis    Degenerative disc disease    Depression    Diabetes mellitus without  complication ([place] )    Borderline diabetic in the past per pt   History of echocardiogram    Echo 1/18: EF 60-65, no RWMA, Gr 1 DD, PASP 20   Hypertension    PVC's (premature ventricular contractions)    Holter 12/17: NSR, occ PAC/PVCs, no AFib; one 4 beat run NSVT    Past Surgical History:  Procedure Laterality Date   ABDOMINAL HYSTERECTOMY  1988   for fibroid tumors   CARPAL TUNNEL RELEASE  1985   rt   [place] ARTHROSCOPY Right    TENDON REPAIR Right 03/03/2013   Procedure: RIGHT DEBRIDEMENT AND TENOLYSIS OF PERONEOUS LONGOUS AND BREVIS TENDONS ;  Surgeon: Wylene Simmer, MD;  Location: [facility];  Service: Orthopedics;  Laterality: Right;   TOOTH EXTRACTION N/A 03/25/2019   Procedure: DENTAL EXTRACTIONS TEETH NUMBER THREE, FOUR, FIVE, SIX, SEVEN, NINE, TWELVE, FOURTEEN, THIRTY AND ALVEOLOPLASTY;  Surgeon: Diona Browner, DDS;  Location: [facility];  Service: Oral Surgery;  Laterality: N/A;    Social History:   reports that she has never smoked. She has never used smokeless tobacco. She reports current alcohol use. She reports that she does not use drugs.  Allergies  Allergen Reactions   Shellfish Allergy Hives    Family History  Problem Relation Age of Onset   Arthritis Mother    Hypertension Mother    Diabetes Mother    Kidney disease Mother  Arrhythmia Mother        AFib   Arthritis Sister    Heart disease Sister    Hypertension Brother    Heart disease Maternal Aunt    Heart failure Maternal Aunt    Heart disease Maternal Uncle    Cancer Maternal Uncle    Heart disease Maternal Grandfather    Heart attack Maternal Grandfather 84       MI     Prior to Admission medications   Medication Sig Start Date End Date Taking? Authorizing Provider  acetaminophen (TYLENOL) 500 MG tablet Take 500 mg by mouth every 6 (six) hours as needed for mild pain.   Yes [provider]  albuterol (VENTOLIN HFA) 108  (90 Base) MCG/ACT inhaler INHALE 2 PUFFS INTO THE LUNGS FOUR TIMES DAILY AS NEEDED FOR SHORTNESS OF BREATH. Patient taking differently: Inhale 2 puffs into the lungs every 6 (six) hours as needed for wheezing or shortness of breath. 06/21/20  Yes Tisovec, Fransico Him, MD  diphenhydrAMINE (BENADRYL) 25 mg capsule Take 25 mg by mouth every 6 (six) hours as needed for itching.   Yes [provider]  linaclotide (LINZESS) 290 MCG CAPS capsule TAKE 1 CAPSULE BY MOUTH AT LEAST 30 MINUTES BEFORE THE FIRST MEAL OF THE DAY ON AN EMPTY STOMACH Patient taking differently: Take 290 mcg by mouth daily before breakfast. 04/13/20  Yes Ronnette Juniper, MD  methocarbamol (ROBAXIN) 500 MG tablet Take 1 tablet (500 mg total) by mouth 2 (two) times daily. Patient taking differently: Take 500 mg by mouth 3 (three) times daily as needed for muscle spasms. 12/07/16  Yes Junius Creamer, NP  omeprazole (PRILOSEC) 20 MG capsule Take 1 capsule (20 mg total) by mouth every morning 30 minutes before a meal. 05/10/21  Yes   valsartan-hydrochlorothiazide (DIOVAN-HCT) 80-12.5 MG tablet TAKE 1 TABLET BY MOUTH DAILY 08/13/20 08/13/21 Yes Tisovec, Fransico Him, MD  albuterol (VENTOLIN HFA) 108 (90 Base) MCG/ACT inhaler INHALE 2 PUFFS BY MOUTH 4 TIMES A DAY AS NEEDED FOR SHORTNESS OF BREATH 09/05/19 09/04/20  Tisovec, Fransico Him, MD  amLODipine (NORVASC) 5 MG tablet Take 1 tablet (5 mg total) by mouth daily. Patient not taking: Reported on 05/27/2021 12/28/15   Glenis Smoker, MD  amoxicillin (AMOXIL) 500 MG capsule Take 1 capsule (500 mg total) by mouth 3 (three) times daily. Patient not taking: Reported on 05/27/2021 03/25/19   Diona Browner, DMD  Cholecalciferol (VITAMIN D3) 1.25 MG (50000 UT) CAPS TAKE 1 CAPSULE BY MOUTH ONCE A WEEK. Patient not taking: Reported on 05/27/2021 06/21/20 06/21/21  Tisovec, Fransico Him, MD  naproxen (NAPROSYN) 500 MG tablet Take 1 tablet (500 mg total) by mouth 2 (two) times daily. Patient not taking: Reported on  05/27/2021 11/03/20   Pearson Forster, NP  oxyCODONE-acetaminophen (PERCOCET) 5-325 MG tablet Take 1 tablet by mouth every 4 (four) hours as needed. Patient not taking: Reported on 05/27/2021 03/25/19   Diona Browner, DMD  pantoprazole (PROTONIX) 40 MG tablet Take 1 tablet (40 mg total) by mouth 2 (two) times daily. Patient not taking: Reported on 05/27/2021 01/09/20     predniSONE (STERAPRED UNI-PAK 21 TAB) 10 MG (21) TBPK tablet Take as directed Patient not taking: Reported on 05/27/2021 11/09/20   Leandrew Koyanagi, MD    Physical Exam: Vitals:   05/26/21 2351 05/26/21 2354 05/27/21 0030 05/27/21 0115  BP:   100/65 (!) 102/53  Pulse:    87  Resp: 19 (!) 22 20 14   Temp:  TempSrc:      SpO2:    96%    Constitutional: NAD, calm  Eyes: PERTLA, lids and conjunctivae normal ENMT: Mucous membranes are moist. Posterior pharynx clear of any exudate or lesions.   Neck: supple, no masses  Respiratory:  no wheezing, no crackles. No accessory muscle use.  Cardiovascular: S1 & S2 heard, regular rate and rhythm. No extremity edema.  Abdomen: No distension, soft. Bowel sounds active.  Musculoskeletal: no clubbing / cyanosis. No joint deformity upper and lower extremities.   Skin: no significant rashes, lesions, ulcers. Warm, dry, well-perfused. Neurologic: CN 2-12 grossly intact. Moving all extremities. Alert and oriented.  Psychiatric: Very pleasant. Cooperative.    Labs and Imaging on Admission: I have personally reviewed following labs and imaging studies  CBC: Recent Labs  Lab 05/26/21 1920  WBC 6.8  NEUTROABS 3.8  HGB 11.2*  HCT 33.6*  MCV 95.5  PLT 341   Basic Metabolic Panel: Recent Labs  Lab 05/26/21 1920  NA 138  K 3.5  CL 104  CO2 26  GLUCOSE 131*  BUN 26*  CREATININE 1.40*  CALCIUM 10.3   GFR: CrCl cannot be calculated (Unknown ideal weight.). Liver Function Tests: Recent Labs  Lab 05/26/21 1920  AST 24  ALT 27  ALKPHOS 69  BILITOT 0.5  PROT 6.7  ALBUMIN 3.2*    Recent Labs  Lab 05/26/21 1920  LIPASE 70*   No results for input(s): AMMONIA in the last 168 hours. Coagulation Profile: No results for input(s): INR, PROTIME in the last 168 hours. Cardiac Enzymes: No results for input(s): CKTOTAL, CKMB, CKMBINDEX, TROPONINI in the last 168 hours. BNP (last 3 results) No results for input(s): PROBNP in the last 8760 hours. HbA1C: No results for input(s): HGBA1C in the last 72 hours. CBG: No results for input(s): GLUCAP in the last 168 hours. Lipid Profile: No results for input(s): CHOL, HDL, LDLCALC, TRIG, CHOLHDL, LDLDIRECT in the last 72 hours. Thyroid Function Tests: No results for input(s): TSH, T4TOTAL, FREET4, T3FREE, THYROIDAB in the last 72 hours. Anemia Panel: No results for input(s): VITAMINB12, FOLATE, FERRITIN, TIBC, IRON, RETICCTPCT in the last 72 hours. Urine analysis:    Component Value Date/Time   COLORURINE YELLOW 05/26/2021 2113   APPEARANCEUR HAZY (A) 05/26/2021 2113   LABSPEC 1.016 05/26/2021 2113   PHURINE 5.0 05/26/2021 2113   GLUCOSEU NEGATIVE 05/26/2021 2113   HGBUR NEGATIVE 05/26/2021 2113   BILIRUBINUR NEGATIVE 05/26/2021 2113   KETONESUR NEGATIVE 05/26/2021 2113   PROTEINUR NEGATIVE 05/26/2021 2113   UROBILINOGEN 1.0 09/24/2011 2046   NITRITE NEGATIVE 05/26/2021 2113   LEUKOCYTESUR SMALL (A) 05/26/2021 2113   Sepsis Labs: @LABRCNTIP (procalcitonin:4,lacticidven:4) ) Recent Results (from the past 240 hour(s))  Resp Panel by RT-PCR (Flu A&B, Covid) Nasopharyngeal Swab     Status: Abnormal   Collection Time: 05/26/21 11:55 PM   Specimen: Nasopharyngeal Swab; Nasopharyngeal(NP) swabs in vial transport medium  Result Value Ref Range Status   SARS Coronavirus 2 by RT PCR POSITIVE (A) NEGATIVE Final    Comment: (NOTE) SARS-CoV-2 target nucleic acids are DETECTED.  The SARS-CoV-2 RNA is generally detectable in upper respiratory specimens during the acute phase of infection. Positive results are indicative of  the presence of the identified virus, but do not rule out bacterial infection or co-infection with other pathogens not detected by the test. Clinical correlation with patient history and other diagnostic information is necessary to determine patient infection status. The expected result is Negative.  Fact Sheet for Patients: EntrepreneurPulse.com.au  Fact Sheet for Healthcare Providers: IncredibleEmployment.be  This test is not yet approved or cleared by the [place] FDA and  has been authorized for detection and/or diagnosis of SARS-CoV-2 by FDA under an Emergency Use Authorization (EUA).  This EUA will remain in effect (meaning this test can be used) for the duration of  the COVID-19 declaration under Section 564(b)(1) of the A ct, 21 U.S.C. section 360bbb-3(b)(1), unless the authorization is terminated or revoked sooner.     Influenza A by PCR NEGATIVE NEGATIVE Final   Influenza B by PCR NEGATIVE NEGATIVE Final    Comment: (NOTE) The Xpert Xpress SARS-CoV-2/FLU/RSV plus assay is intended as an aid in the diagnosis of influenza from Nasopharyngeal swab specimens and should not be used as a sole basis for treatment. Nasal washings and aspirates are unacceptable for Xpert Xpress SARS-CoV-2/FLU/RSV testing.  Fact Sheet for Patients: EntrepreneurPulse.com.au  Fact Sheet for Healthcare Providers: IncredibleEmployment.be  This test is not yet approved or cleared by the [place] FDA and has been authorized for detection and/or diagnosis of SARS-CoV-2 by FDA under an Emergency Use Authorization (EUA). This EUA will remain in effect (meaning this test can be used) for the duration of the COVID-19 declaration under Section 564(b)(1) of the Act, 21 U.S.C. section 360bbb-3(b)(1), unless the authorization is terminated or revoked.  Performed at [facility], [place] [address], [place],  [place] [address]      Radiological Exams on Admission: CT ABDOMEN PELVIS W CONTRAST  Result Date: 05/26/2021 CLINICAL DATA:  Unspecified abdominal pain, pancreatitis EXAM: CT ABDOMEN AND PELVIS WITH CONTRAST TECHNIQUE: Multidetector CT imaging of the abdomen and pelvis was performed using the standard protocol following bolus administration of intravenous contrast. CONTRAST:  69mL OMNIPAQUE IOHEXOL 300 MG/ML  SOLN COMPARISON:  05/11/2019 FINDINGS: Lower chest: No acute abnormality. Hepatobiliary: No focal liver abnormality is seen. Status post cholecystectomy. No biliary dilatation. Pancreas: Unremarkable. No pancreatic ductal dilatation or surrounding inflammatory changes. 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 The spleen is otherwise unremarkable. Adrenals/Urinary Tract: Adrenal glands are unremarkable. The kidneys are normal in size and position. Subcentimeter hypodensity within the left kidney likely represent tiny cortical cysts are stable since prior examination. The kidneys are otherwise unremarkable. Bladder unremarkable. Stomach/Bowel: Moderate descending colonic diverticulosis. The stomach, small bowel, and large bowel are otherwise unremarkable. Appendix normal. No free intraperitoneal gas or fluid. Vascular/Lymphatic: No significant vascular findings are present. No enlarged abdominal or pelvic lymph nodes. Reproductive: Status post hysterectomy. No adnexal masses. Other: No abdominal wall hernia or abnormality. No abdominopelvic ascites. Musculoskeletal: No acute bone abnormality. No lytic or blastic bone lesion. IMPRESSION: No acute intra-abdominal pathology identified. No CT evidence of acute pancreatitis. 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 Descending colonic diverticulosis without superimposed acute inflammatory change. Electronically Signed   By: Fidela Salisbury M.D.   On: 05/26/2021 21:58    EKG: Independently reviewed. Atrial fibrillation, rate 133.   Assessment/Plan   1. Atrial fibrillation with RVR, new-onset  - Presents with abdomina pain and developed rapid a fib in ED - She had intense palpitations while trying to sleep a few nights ago but no documented hx of a fib   - Unclear precipitant, possibly COVID though she has minimal to no sxs  - Echo from 2018  with no significant valve disease and no atrial enlargement  - CHADS-VASc at least 3 (age, gender, HTN)  - Eliquis and IV diltiazem started in ED - Start oral metoprolol and attempt to titrate IV diltiazem to off, continue Eliquis, check TSH    2. Abdominal pain  - Pt with chronic abdominal discomfort presents with abd pain, same in character as her chronic pain but more intense that usual  - Exam benign and no acute findings on CT  - Lipase slightly elevated  - Possibly mild pancreatitis or gastritis after indulging in more EtOH that usual for the Holidays  - Continue symptomatic management    3. COVID-19  - Incidentally positive, minimal sxs if any  - Started on Eliquis in ED, a Paxlovid contraindication  - Continue isolation, supportive care    4. Renal insufficiency  - SCr is 1.40 on admission, up from 0.68 in Oct 2020  - Unclear acuity  - Hold valsartan-HCTZ, renally-dose medications, monitor    5. Hypertension  - BP at goal  - Hold her home antihypertensives while starting rate-control agent    6. Asthma  - Stable, not in exacerbation, continue as-needed SABA    DVT prophylaxis: Eliquis  Code Status: Full  Level of Care: Level of care: Progressive Family Communication: none present  Disposition Plan:  Patient is from: home  Anticipated d/c is to: home  Anticipated d/c date is: 1/2 or 05/28/21  Patient currently: Pending  stability on oral medications  Consults called: None  Admission status: Observation     Vianne Bulls, MD [facility]  05/27/2021, 1:55 AM

## 2021-05-27 NOTE — ED Notes (Signed)
Breakfast Orders Placed °

## 2021-05-27 NOTE — Assessment & Plan Note (Addendum)
Presented with abdomina pain, went in RVR in the ED.  Pt reported intense palpitations while trying to sleep a few nights ago but no documented hx of a fib.  Unclear precipitant, possibly COVID though she has minimal symptoms.  Echo from 2018 with no significant valve disease and no atrial enlargement  CHADS-VASc at least 3 (age, gender, HTN)  --Eliquis and IV diltiazem started in ED --Started on oral metoprolol  --Off Cardizem drip 1/2 morning --Continue Eliquis

## 2021-05-27 NOTE — Assessment & Plan Note (Addendum)
BP soft on 1/2 AM with metoprolol, improved today.  Monitor BP. Valsartan/HCTZ is on hold.

## 2021-05-27 NOTE — Assessment & Plan Note (Addendum)
Minimal symptoms reported on admission. Today, having headache and reports recent scratchy/sore throat, mildly increases SOB on exertion, in addition to the abdominal pain that was primary complaint. 1/3: fever 102 F around 9pm last night, briefly appears was on 2 L/min O2.  Having cough now as well. --start IV steroids given hx of asthma and poor air movement on exam (only briefly on 2 L/min o2) --start molnupiravir --LFT's transiently elevated, deferred remdesivir, Paxlovid --Supportive Care, antitussives, antipyretics --Monitor inflammatory markers --Airborne and contact precautions.

## 2021-05-27 NOTE — Assessment & Plan Note (Addendum)
Seems mildly exacerbated now, very poor air movement but not overtly wheezing. Pt does report mild increase in SOB recently, possibly due to Covid. --Continue PRN bronchodilators --Start on IV steroids, likely switch to prednisone tomorrow

## 2021-05-27 NOTE — Progress Notes (Addendum)
Progress Note   Patient: Karen Ferguson KGM:[id number] DOB: Dec 29, 1955 DOA: 05/26/2021     0 DOS: the patient was seen and examined on 05/27/2021   Brief hospital course: Karen Ferguson is a very pleasant 66 y.o. female with medical history significant for hypertension, asthma, and chronic abdominal discomfort, presented tot he ED on evening of 05/26/21 for evaluation of abdominal pain, more severe than her typical episodes.  No vomiting, diarrhea, fever or chills.   She also reported an episode of intense palpitations a few nights ago that resolved on its own after several minutes. She wore a Holter monitor in 2017 which documented sinus rhythm with PACs and PVCs but no atrial fibrillation or pauses.  In to the ED, vitals were initially normal, but she later became tachycardic to the 130s and was noted to be in atrial fibrillation.  CT abdomen/and pelvis negative for acute findings.  Labs notable for creatinine 1.40 and lipase 70.  COVID PCR positive.  Patient was given a liter of saline, morphine, Zofran, Eliquis, IV Lopressor, and started on diltiazem infusion in the ED.  Admitted for observation to [facility] service.  Assessment and Plan * Atrial fibrillation with rapid ventricular response ([place])- (present on admission) Presented with abdomina pain, went in RVR in the ED.  Pt reported intense palpitations while trying to sleep a few nights ago but no documented hx of a fib.  Unclear precipitant, possibly COVID though she has minimal symptoms.  Echo from 2018 with no significant valve disease and no atrial enlargement  CHADS-VASc at least 3 (age, gender, HTN)  --Eliquis and IV diltiazem started in ED --Started on oral metoprolol  --Off Cardizem drip today --Continue Eliquis --TSH low, add on free T4 --BP low with systolic 64'Q on monitor in ED this AM, continue IV hydration and close monitoring  Palpitations- (present on admission) Due to rapid Afib.  Resolved with HR control.  Abdominal pain- (present  on admission) Pt has hx of chronic abdominal discomfort, presented with worsened abd pain, more intense than usual.  Possibly related to Covid infection vs mild pancreatitis or gastritis. Reassuring that abdominal exam benign and no acute findings on CT.  Lipase slightly elevated.  -- Continue symptomatic management    COVID-19 virus detected- (present on admission) Minimal symptoms reported on admission. Today, having headache and reports recent scratchy/sore throat, mildly increases SOB on exertion, in addition to the abdominal pain that was primary complaint. --LFT's are up today, will defer remdesivir, Paxlovid --Supportive Care --Monitor inflammatory markers --Treat Afib as outlined --Airborne and contact precautions.  AKI (acute kidney injury) ([place])- (present on admission) Presented w Cr 1.40 on admission, up from 0.68 in Oct 2020.  Unclear acuity / recent baseline. --Hold valsartan-HCTZ --Renally-dose medications --Monitor BMP  Essential hypertension- (present on admission) BP soft this AM with metoprolol. Monitor BP. Valsartan/HCTZ is on hold.  Asthma- (present on admission) Stable, not acutely exacerbated. Pt does report mild increase in SOB recently, possibly due to Covid. --Continue PRN bronchodilators  Obstructive sleep apnea- (present on admission) Unclear if on CPAP currently at home. Follow up.  GERD (gastroesophageal reflux disease)- (present on admission) Continue PPI  Abnormal TSH- (present on admission) TSH low on admission. Add on free T4 - follow up.     Subjective: Pt feels better this AM.  Having a headache, right frontal.  Endorses recent sore/scratchy throat and mild SOB but not really coughing or wheezing.  No palpitations this AM.  Very uncomfortable on ED stretcher.  Objective Vitals notable for BP on monitor was 88 systolic during encounter, soft BP's, intermittent tachypnea, afebrile, no hypoxia.  General exam: awake, alert, no acute  distress HEENT: atraumatic, clear conjunctiva, anicteric sclera, moist mucus membranes, hearing grossly normal  Respiratory system: CTAB poor air movement, no wheezes, rales or rhonchi, normal respiratory effort. Cardiovascular system: normal S1/S2, RRR, no pedal edema.   Gastrointestinal system: soft, NT, ND, no HSM felt, +bowel sounds. Central nervous system: A&O x3. no gross focal neurologic deficits, normal speech Extremities: moves all, no edema, normal tone Skin: warm, dry, intact Psychiatry: normal mood, congruent affect, judgement and insight appear normal   Data Reviewed: Labs notable for glucose 120, Mg 1.6, AST increased 123, ALT increased 70, normal Tbili, ferritin 326, CRP 1.2,   Family Communication:   Disposition: Status is: Inpatient  Remains inpatient appropriate because: soft BP's with rate control, on IV fluids, new LFT elevation         Time spent: 35 minutes  Author: Ezekiel Slocumb 05/27/2021 6:15 PM  For on call review [email or web address].

## 2021-05-27 NOTE — ED Notes (Signed)
Notified admitting MD, Dr. Myna Hidalgo that Karen Ferguson has converted back to NSR with her HR 88-91.

## 2021-05-28 ENCOUNTER — Other Ambulatory Visit (HOSPITAL_COMMUNITY): Payer: Self-pay

## 2021-05-28 LAB — COMPREHENSIVE METABOLIC PANEL
ALT: 44 U/L (ref 0–44)
AST: 33 U/L (ref 15–41)
Albumin: 2.6 g/dL — ABNORMAL LOW (ref 3.5–5.0)
Alkaline Phosphatase: 79 U/L (ref 38–126)
Anion gap: 5 (ref 5–15)
BUN: 15 mg/dL (ref 8–23)
CO2: 26 mmol/L (ref 22–32)
Calcium: 9.6 mg/dL (ref 8.9–10.3)
Chloride: 110 mmol/L (ref 98–111)
Creatinine, Ser: 0.74 mg/dL (ref 0.44–1.00)
GFR, Estimated: >60 mL/min (ref >60)
Glucose, Bld: 84 mg/dL (ref 70–99)
Potassium: 3.8 mmol/L (ref 3.5–5.1)
Sodium: 141 mmol/L (ref 135–145)
Total Bilirubin: 0.2 mg/dL — ABNORMAL LOW (ref 0.3–1.2)
Total Protein: 5.9 g/dL — ABNORMAL LOW (ref 6.5–8.1)

## 2021-05-28 LAB — CBC WITH DIFFERENTIAL/PLATELET
Abs Immature Granulocytes: 0.02 10*3/uL (ref 0.00–0.07)
Basophils Absolute: 0 10*3/uL (ref 0.0–0.1)
Basophils Relative: 0 %
Eosinophils Absolute: 0.1 10*3/uL (ref 0.0–0.5)
Eosinophils Relative: 2 %
HCT: 28.8 % — ABNORMAL LOW (ref 36.0–46.0)
Hemoglobin: 9.4 g/dL — ABNORMAL LOW (ref 12.0–15.0)
Immature Granulocytes: 1 %
Lymphocytes Relative: 32 %
Lymphs Abs: 1.3 10*3/uL (ref 0.7–4.0)
MCH: 31.5 pg (ref 26.0–34.0)
MCHC: 32.6 g/dL (ref 30.0–36.0)
MCV: 96.6 fL (ref 80.0–100.0)
Monocytes Absolute: 0.8 10*3/uL (ref 0.1–1.0)
Monocytes Relative: 18 %
Neutro Abs: 2 10*3/uL (ref 1.7–7.7)
Neutrophils Relative %: 47 %
Platelets: 181 10*3/uL (ref 150–400)
RBC: 2.98 MIL/uL — ABNORMAL LOW (ref 3.87–5.11)
RDW: 12.1 % (ref 11.5–15.5)
WBC: 4.2 10*3/uL (ref 4.0–10.5)
nRBC: 0 % (ref 0.0–0.2)

## 2021-05-28 MED ORDER — MAGNESIUM SULFATE 2 GM/50ML IV SOLN
2.0000 g | Freq: Once | INTRAVENOUS | Status: AC
  Administered 2021-05-28: 2 g via INTRAVENOUS
  Filled 2021-05-28: qty 50

## 2021-05-28 MED ORDER — ALUM & MAG HYDROXIDE-SIMETH 200-200-20 MG/5ML PO SUSP
30.0000 mL | ORAL | Status: DC | PRN
  Administered 2021-05-28 – 2021-05-29 (×2): 30 mL via ORAL
  Filled 2021-05-28 (×2): qty 30

## 2021-05-28 MED ORDER — METHYLPREDNISOLONE SODIUM SUCC 125 MG IJ SOLR
60.0000 mg | INTRAMUSCULAR | Status: DC
  Administered 2021-05-28 – 2021-05-29 (×2): 60 mg via INTRAVENOUS
  Filled 2021-05-28 (×2): qty 2

## 2021-05-28 MED ORDER — MOLNUPIRAVIR EUA 200MG CAPSULE
4.0000 | ORAL_CAPSULE | Freq: Two times a day (BID) | ORAL | Status: DC
  Administered 2021-05-28 – 2021-05-30 (×5): 800 mg via ORAL
  Filled 2021-05-28 (×2): qty 4

## 2021-05-28 NOTE — Progress Notes (Signed)
°  Transition of Care [facility]) Screening Note   Patient Details  Name: Karen Ferguson Date of Birth: [birth year]   Transition of Care [facility]) CM/SW Contact:    Dawayne Patricia, RN Phone Number: 05/28/2021, 2:42 PM    Transition of Care Department [facility]) has reviewed patient and no TOC needs have been identified at this time. We will continue to monitor patient advancement through interdisciplinary progression rounds. If new patient transition needs arise, please place a TOC consult.

## 2021-05-28 NOTE — Progress Notes (Signed)
Progress Note   Patient: Karen Ferguson JKD:[id number] DOB: 1955/10/17 DOA: 05/26/2021     1 DOS: the patient was seen and examined on 05/28/2021   Brief hospital course: Karen Ferguson is a very pleasant 66 y.o. female with medical history significant for hypertension, asthma, and chronic abdominal discomfort, presented tot he ED on evening of 05/26/21 for evaluation of abdominal pain, more severe than her typical episodes.  No vomiting, diarrhea, fever or chills.   She also reported an episode of intense palpitations a few nights ago that resolved on its own after several minutes. She wore a Holter monitor in 2017 which documented sinus rhythm with PACs and PVCs but no atrial fibrillation or pauses.  In to the ED, vitals were initially normal, but she later became tachycardic to the 130s and was noted to be in atrial fibrillation.  CT abdomen/and pelvis negative for acute findings.  Labs notable for creatinine 1.40 and lipase 70.  COVID PCR positive.  Patient was given a liter of saline, morphine, Zofran, Eliquis, IV Lopressor, and started on diltiazem infusion in the ED.  Admitted for observation to [facility] service.  Assessment and Plan * Atrial fibrillation with rapid ventricular response ([place])- (present on admission) Presented with abdomina pain, went in RVR in the ED.  Pt reported intense palpitations while trying to sleep a few nights ago but no documented hx of a fib.  Unclear precipitant, possibly COVID though she has minimal symptoms.  Echo from 2018 with no significant valve disease and no atrial enlargement  CHADS-VASc at least 3 (age, gender, HTN)  --Eliquis and IV diltiazem started in ED --Started on oral metoprolol  --Off Cardizem drip 1/2 morning --Continue Eliquis   Palpitations- (present on admission) Due to rapid Afib.  Resolved with HR control.  Abdominal pain- (present on admission) Pt has hx of chronic abdominal discomfort, presented with worsened abd pain, more intense than usual.   Possibly related to Covid infection vs mild pancreatitis or gastritis. Reassuring that abdominal exam benign and no acute findings on CT.  Lipase slightly elevated.  -- Continue symptomatic management    COVID-19 virus detected- (present on admission) Minimal symptoms reported on admission. Today, having headache and reports recent scratchy/sore throat, mildly increases SOB on exertion, in addition to the abdominal pain that was primary complaint. 1/3: fever 102 F around 9pm last night, briefly appears was on 2 L/min O2.  Having cough now as well. --start IV steroids given hx of asthma and poor air movement on exam (only briefly on 2 L/min o2) --start molnupiravir --LFT's transiently elevated, deferred remdesivir, Paxlovid --Supportive Care, antitussives, antipyretics --Monitor inflammatory markers --Airborne and contact precautions.  AKI (acute kidney injury) ([place])- (present on admission) Presented w Cr 1.40 on admission, up from 0.68 in Oct 2020.  Unclear acuity / recent baseline. --Hold valsartan-HCTZ --Renally-dose medications --Monitor BMP  Essential hypertension- (present on admission) BP soft on 1/2 AM with metoprolol, improved today.  Monitor BP. Valsartan/HCTZ is on hold.  Asthma- (present on admission) Seems mildly exacerbated now, very poor air movement but not overtly wheezing. Pt does report mild increase in SOB recently, possibly due to Covid. --Continue PRN bronchodilators --Start on IV steroids, likely switch to prednisone tomorrow  Obstructive sleep apnea- (present on admission) Unclear if on CPAP currently at home. Follow up.  GERD (gastroesophageal reflux disease)- (present on admission) Continue PPI  Abnormal TSH- (present on admission) TSH low on admission.  Free T4 elevated. Hyperthyroid chronic vs related to current acute  illness.  HR now controlled. Hold of starting methimazole at this time and follow up to consider.     Subjective: Pt awake  resting in bed this AM.  Fever overnight and she has felt intermittent hot/cold.  Reports headache again this AM, also with mostly dry cough today.  Doesn't feel like wheezing, but does have mild SOB worse on exertion.  Feels terrible but hopes to go home soon.  Objective Vitals notable for BP 111/43 this AM, was on 2 L/min [place] O2 last night, on room air this AM.  Fever 102 F at 2108 last night, afebrile this AM.  General exam: awake, alert, no acute distress HEENT: atraumatic, clear conjunctiva, anicteric sclera, moist mucus membranes, hearing grossly normal  Respiratory system: CTAB but with very poor air movement, no wheezes, rales or rhonchi, normal respiratory effort at rest, on room air. Cardiovascular system: normal S1/S2, RRR, no JVD, murmurs, rubs, gallops, 3 no pedal edema.   Gastrointestinal system: soft, NT, ND, no HSM felt, +bowel sounds. Central nervous system: A&O x4. no gross focal neurologic deficits, normal speech Extremities: moves all, no edema, normal tone Skin: warm, dry, intact Psychiatry: normal mood, congruent affect, judgement and insight appear normal   Data Reviewed: Labs notable for normalized AST and ALT that were transiently elevated yesterday.  Albumin trending down 3.2>>2.8>>2.6, Hbg 11.2>>10.7>>9.4 this AM (?dilutional)  Family Communication:   Disposition: Status is: Inpatient  Remains inpatient appropriate because: On IV fluids, IV steroids, electrolytes being replaced and monitored.         Time spent: 30 minutes  Author: Ezekiel Slocumb 05/28/2021 2:24 PM  For on call review [email or web address].

## 2021-05-28 NOTE — Plan of Care (Signed)
Problem: Education: Goal: Knowledge of risk factors and measures for prevention of condition will improve Outcome: Progressing   Problem: Coping: Goal: Psychosocial and spiritual needs will be supported Outcome: Progressing   Problem: Respiratory: Goal: Complications related to the disease process, condition or treatment will be avoided or minimized Outcome: Progressing   Problem: Respiratory: Goal: Will maintain a patent airway Outcome: Completed/Met  Pt had multiple questions r/t COVID infection.  Hand-out given as per unit protocol and infection prevention standards of care as per the CDC were discussed with patient.  Comprehension of information was ascertained via use of "teach-back" technique.

## 2021-05-28 NOTE — Discharge Instructions (Signed)

## 2021-05-28 NOTE — TOC Benefit Eligibility Note (Signed)
Patient Advocate Encounter ° °Insurance verification completed.   ° °The patient is currently admitted and upon discharge could be taking Eliquis 5 mg. ° °The current 30 day co-pay is, $0.00.  ° °The patient is insured through [employer] Medicare Part D  ° ° ° °Zachrey Deutscher, CPhT °Pharmacy Patient Advocate Specialist °[facility] Patient Advocate Team °Direct Number: [phone]  Fax: [phone] ° ° ° ° ° °  °

## 2021-05-29 ENCOUNTER — Inpatient Hospital Stay (HOSPITAL_COMMUNITY): Payer: Medicare Other

## 2021-05-29 LAB — COMPREHENSIVE METABOLIC PANEL
ALT: 38 U/L (ref 0–44)
AST: 23 U/L (ref 15–41)
Albumin: 2.9 g/dL — ABNORMAL LOW (ref 3.5–5.0)
Alkaline Phosphatase: 83 U/L (ref 38–126)
Anion gap: 7 (ref 5–15)
BUN: 14 mg/dL (ref 8–23)
CO2: 25 mmol/L (ref 22–32)
Calcium: 10.1 mg/dL (ref 8.9–10.3)
Chloride: 107 mmol/L (ref 98–111)
Creatinine, Ser: 0.62 mg/dL (ref 0.44–1.00)
GFR, Estimated: >60 mL/min (ref >60)
Glucose, Bld: 118 mg/dL — ABNORMAL HIGH (ref 70–99)
Potassium: 4.3 mmol/L (ref 3.5–5.1)
Sodium: 139 mmol/L (ref 135–145)
Total Bilirubin: 0.6 mg/dL (ref 0.3–1.2)
Total Protein: 6.4 g/dL — ABNORMAL LOW (ref 6.5–8.1)

## 2021-05-29 LAB — CBC
HCT: 30.5 % — ABNORMAL LOW (ref 36.0–46.0)
Hemoglobin: 10.1 g/dL — ABNORMAL LOW (ref 12.0–15.0)
MCH: 31.1 pg (ref 26.0–34.0)
MCHC: 33.1 g/dL (ref 30.0–36.0)
MCV: 93.8 fL (ref 80.0–100.0)
Platelets: 195 10*3/uL (ref 150–400)
RBC: 3.25 MIL/uL — ABNORMAL LOW (ref 3.87–5.11)
RDW: 11.5 % (ref 11.5–15.5)
WBC: 3.7 10*3/uL — ABNORMAL LOW (ref 4.0–10.5)
nRBC: 0 % (ref 0.0–0.2)

## 2021-05-29 LAB — MAGNESIUM: Magnesium: 1.7 mg/dL (ref 1.7–2.4)

## 2021-05-29 LAB — C-REACTIVE PROTEIN: CRP: 2.5 mg/dL — ABNORMAL HIGH (ref <1.0)

## 2021-05-29 MED ORDER — PREDNISONE 20 MG PO TABS
40.0000 mg | ORAL_TABLET | Freq: Every day | ORAL | Status: DC
  Administered 2021-05-30: 40 mg via ORAL
  Filled 2021-05-29: qty 2

## 2021-05-29 NOTE — Progress Notes (Signed)
PROGRESS NOTE    Karen Ferguson [facility]  OZD:[id number] DOB: Dec 14, 1955 DOA: 05/26/2021 PCP: Haywood Pao, MD    Chief Complaint  Patient presents with   Abdominal Pain    Brief Narrative:  Karen Ferguson is a very pleasant 66 y.o. female with medical history significant for hypertension, asthma, and chronic abdominal discomfort, presented tot he ED on evening of 05/26/21 for evaluation of abdominal pain, more severe than her typical episodes.  No vomiting, diarrhea, fever or chills.   She also reported an episode of intense palpitations a few nights ago that resolved on its own after several minutes. She wore a Holter monitor in 2017 which documented sinus rhythm with PACs and PVCs but no atrial fibrillation or pauses.  In to the ED, vitals were initially normal, but she later became tachycardic to the 130s and was noted to be in atrial fibrillation.  CT abdomen/and pelvis negative for acute findings.  Labs notable for creatinine 1.40 and lipase 70.  COVID PCR positive.  Patient was given a liter of saline, morphine, Zofran, Eliquis, IV Lopressor, and started on diltiazem infusion in the ED.  Admitted for observation to [facility] service.  Subjective:  Reports feeling better, she is on room air She is tolerating iv steroids Assessment & Plan:   Principal Problem:   Atrial fibrillation with rapid ventricular response (HCC) Active Problems:   Essential hypertension   Asthma   GERD (gastroesophageal reflux disease)   Obstructive sleep apnea   Palpitations   AKI (acute kidney injury) ([place])   COVID-19 virus detected   Abdominal pain   Atrial fibrillation with RVR (HCC)   Abnormal TSH  Afib/RVR presents on admission Pt reported intense palpitations while trying to sleep a few nights ago but no documented hx of a fib.  Unclear precipitant, possibly COVID though she has minimal symptoms.  Echo from 2018 with no significant valve disease and no atrial enlargement  CHADS-VASc at least 3 (age, gender,  HTN)  --started on Eliquis and IV diltiazem started in ED --off cardizem drip, Started on oral metoprolol   Hyperthyroidism Will get thyroid US Need outpatient follow up  Abdominal pain- (present on admission) Pt has hx of chronic abdominal discomfort, presented with worsened abd pain, more intense than usual.  Possibly related to Covid infection vs mild pancreatitis or gastritis. Reassuring that abdominal exam benign and no acute findings on CT.  Lipase slightly elevated.  -- Continue symptomatic management   Covid 19 with asthma exacerbation Treated with steroid, on molnupiravir Appear improving, now on room air, d/c iv steroids, starts oral steroids  HTN Bp stable on current regimen  AKI resolved   Body mass index is 31.73 kg/m.Marland Kitchen      Unresulted Labs (From admission, onward)     Start     Ordered   05/30/21 2595  Basic metabolic panel  Tomorrow morning,   R       Question:  Specimen collection method  Answer:  Lab=Lab collect   05/29/21 2006   05/30/21 0500  Magnesium  Tomorrow morning,   R       Question:  Specimen collection method  Answer:  Lab=Lab collect   05/29/21 2006   05/29/21 0500  C-reactive protein  Daily,   R      05/28/21 0740   05/27/21 0155  Sodium, urine, random  Add-on,   AD        05/27/21 0154   05/27/21 0155  Creatinine, urine, random  Add-on,  AD        05/27/21 0154              DVT prophylaxis:  apixaban (ELIQUIS) tablet 5 mg   Code Status:full Family Communication: Patient Disposition:   Status is: Inpatient    Dispo: The patient is from: home              Anticipated d/c is to: home              Anticipated d/c date is: possible home tomorrow, needs cardiology and endocrinology follow up                Consultants:  none  Procedures:  none  Antimicrobials:   Anti-infectives (From admission, onward)    Start     Dose/Rate Route Frequency Ordered Stop   05/28/21 1000  molnupiravir EUA (LAGEVRIO) capsule 800 mg         4 capsule Oral 2 times daily 05/28/21 0742 06/02/21 0959           Objective: Vitals:   05/29/21 0401 05/29/21 0807 05/29/21 1241 05/29/21 1700  BP: (!) 133/57 140/67 (!) 109/48 (!) 114/47  Pulse: 78 80 67 74  Resp: 20 18 19 20   Temp: 97.8 F (36.6 C) 97.8 F (36.6 C) 98.3 F (36.8 C) 98.5 F (36.9 C)  TempSrc: Oral Oral Oral Oral  SpO2: 100% 97% 100% 96%  Weight:      Height:        Intake/Output Summary (Last 24 hours) at 05/29/2021 2007 Last data filed at 05/29/2021 1200 Gross per 24 hour  Intake 480 ml  Output --  Net 480 ml   Filed Weights   05/28/21 1500  Weight: 86.5 kg    Examination:  General exam: alert, awake, communicative,calm, NAD Respiratory system: Clear to auscultation. Respiratory effort normal. Cardiovascular system:  RRR.  Gastrointestinal system: Abdomen is nondistended, soft and nontender.  Normal bowel sounds heard. Central nervous system: Alert and oriented. No focal neurological deficits. Extremities:  no edema Skin: No rashes, lesions or ulcers Psychiatry: Judgement and insight appear normal. Mood & affect appropriate.     Data Reviewed: I have personally reviewed following labs and imaging studies  CBC: Recent Labs  Lab 05/26/21 1920 05/27/21 0300 05/28/21 0749 05/29/21 0514  WBC 6.8 5.8 4.2 3.7*  NEUTROABS 3.8  --  2.0  --   HGB 11.2* 10.7* 9.4* 10.1*  HCT 33.6* 32.5* 28.8* 30.5*  MCV 95.5 95.6 96.6 93.8  PLT 255 239 181 637    Basic Metabolic Panel: Recent Labs  Lab 05/26/21 1920 05/27/21 0300 05/28/21 0749 05/29/21 0514  NA 138 138 141 139  K 3.5 3.9 3.8 4.3  CL 104 108 110 107  CO2 26 26 26 25   GLUCOSE 131* 120* 84 118*  BUN 26* 22 15 14   CREATININE 1.40* 1.00 0.74 0.62  CALCIUM 10.3 9.7 9.6 10.1  MG  --  1.6*  --  1.7    GFR: Estimated Creatinine Clearance: 74.1 mL/min (by C-G formula based on SCr of 0.62 mg/dL).  Liver Function Tests: Recent Labs  Lab 05/26/21 1920 05/27/21 0300  05/28/21 0749 05/29/21 0514  AST 24 123* 33 23  ALT 27 70* 44 38  ALKPHOS 69 100 79 83  BILITOT 0.5 0.4 0.2* 0.6  PROT 6.7 6.2* 5.9* 6.4*  ALBUMIN 3.2* 2.8* 2.6* 2.9*    CBG: No results for input(s): GLUCAP in the last 168 hours.   Recent Results (from  the past 240 hour(s))  Resp Panel by RT-PCR (Flu A&B, Covid) Nasopharyngeal Swab     Status: Abnormal   Collection Time: 05/26/21 11:55 PM   Specimen: Nasopharyngeal Swab; Nasopharyngeal(NP) swabs in vial transport medium  Result Value Ref Range Status   SARS Coronavirus 2 by RT PCR POSITIVE (A) NEGATIVE Final    Comment: (NOTE) SARS-CoV-2 target nucleic acids are DETECTED.  The SARS-CoV-2 RNA is generally detectable in upper respiratory specimens during the acute phase of infection. Positive results are indicative of the presence of the identified virus, but do not rule out bacterial infection or co-infection with other pathogens not detected by the test. Clinical correlation with patient history and other diagnostic information is necessary to determine patient infection status. The expected result is Negative.  Fact Sheet for Patients: EntrepreneurPulse.com.au  Fact Sheet for Healthcare Providers: IncredibleEmployment.be  This test is not yet approved or cleared by the [place] FDA and  has been authorized for detection and/or diagnosis of SARS-CoV-2 by FDA under an Emergency Use Authorization (EUA).  This EUA will remain in effect (meaning this test can be used) for the duration of  the COVID-19 declaration under Section 564(b)(1) of the A ct, 21 U.S.C. section 360bbb-3(b)(1), unless the authorization is terminated or revoked sooner.     Influenza A by PCR NEGATIVE NEGATIVE Final   Influenza B by PCR NEGATIVE NEGATIVE Final    Comment: (NOTE) The Xpert Xpress SARS-CoV-2/FLU/RSV plus assay is intended as an aid in the diagnosis of influenza from Nasopharyngeal swab specimens  and should not be used as a sole basis for treatment. Nasal washings and aspirates are unacceptable for Xpert Xpress SARS-CoV-2/FLU/RSV testing.  Fact Sheet for Patients: EntrepreneurPulse.com.au  Fact Sheet for Healthcare Providers: IncredibleEmployment.be  This test is not yet approved or cleared by the [place] FDA and has been authorized for detection and/or diagnosis of SARS-CoV-2 by FDA under an Emergency Use Authorization (EUA). This EUA will remain in effect (meaning this test can be used) for the duration of the COVID-19 declaration under Section 564(b)(1) of the Act, 21 U.S.C. section 360bbb-3(b)(1), unless the authorization is terminated or revoked.  Performed at [facility], [place] [address] [place], [place] [address]          Radiology Studies: No results found.      Scheduled Meds:  apixaban  5 mg Oral BID   linaclotide  290 mcg Oral QAC breakfast   metoprolol tartrate  25 mg Oral BID   molnupiravir EUA  4 capsule Oral BID   pantoprazole  40 mg Oral Daily   [START ON 05/30/2021] predniSONE  40 mg Oral Q breakfast   Continuous Infusions:   LOS: 2 days   Time spent: 82mins Greater than 50% of this time was spent in counseling, explanation of diagnosis, planning of further management, and coordination of care.   Voice Recognition Viviann Spare dictation system was used to create this note, attempts have been made to correct errors. Please contact the author with questions and/or clarifications.   Florencia Reasons, MD PhD FACP [facility]  Available via Epic secure chat 7am-7pm for nonurgent issues Please page for urgent issues To page the attending provider between 7A-7P or the covering provider during after hours 7P-7A, please log into the web site [email or web address] and access using universal [place] password for that web site. If you do not have the password, please call the hospital operator.    05/29/2021,  8:07 PM

## 2021-05-30 ENCOUNTER — Other Ambulatory Visit (HOSPITAL_COMMUNITY): Payer: Self-pay

## 2021-05-30 ENCOUNTER — Telehealth: Payer: Self-pay | Admitting: Endocrinology

## 2021-05-30 LAB — C-REACTIVE PROTEIN: CRP: 1.3 mg/dL — ABNORMAL HIGH (ref <1.0)

## 2021-05-30 LAB — BASIC METABOLIC PANEL
Anion gap: 5 (ref 5–15)
BUN: 19 mg/dL (ref 8–23)
CO2: 27 mmol/L (ref 22–32)
Calcium: 9.8 mg/dL (ref 8.9–10.3)
Chloride: 106 mmol/L (ref 98–111)
Creatinine, Ser: 0.64 mg/dL (ref 0.44–1.00)
GFR, Estimated: >60 mL/min (ref >60)
Glucose, Bld: 130 mg/dL — ABNORMAL HIGH (ref 70–99)
Potassium: 4.4 mmol/L (ref 3.5–5.1)
Sodium: 138 mmol/L (ref 135–145)

## 2021-05-30 LAB — MAGNESIUM: Magnesium: 1.8 mg/dL (ref 1.7–2.4)

## 2021-05-30 MED ORDER — PREDNISONE 10 MG PO TABS
ORAL_TABLET | ORAL | 0 refills | Status: AC
  Filled 2021-05-30: qty 6, 3d supply, fill #0

## 2021-05-30 MED ORDER — PANTOPRAZOLE SODIUM 40 MG PO TBEC
40.0000 mg | DELAYED_RELEASE_TABLET | Freq: Every day | ORAL | 0 refills | Status: DC
  Filled 2021-05-30: qty 30, 30d supply, fill #0

## 2021-05-30 MED ORDER — METOPROLOL TARTRATE 25 MG PO TABS
25.0000 mg | ORAL_TABLET | Freq: Two times a day (BID) | ORAL | 2 refills | Status: DC
  Filled 2021-05-30: qty 60, 30d supply, fill #0

## 2021-05-30 MED ORDER — APIXABAN 5 MG PO TABS
5.0000 mg | ORAL_TABLET | Freq: Two times a day (BID) | ORAL | 2 refills | Status: AC
  Filled 2021-05-30: qty 60, 30d supply, fill #0
  Filled 2021-07-08: qty 60, 30d supply, fill #1

## 2021-05-30 MED ORDER — MOLNUPIRAVIR EUA 200MG CAPSULE
4.0000 | ORAL_CAPSULE | Freq: Two times a day (BID) | ORAL | 0 refills | Status: AC
  Filled 2021-05-30: qty 24, 3d supply, fill #0

## 2021-05-30 NOTE — Plan of Care (Signed)
  Problem: Education: Goal: Knowledge of General Education information will improve Description: Including pain rating scale, medication(s)/side effects and non-pharmacologic comfort measures Outcome: Adequate for Discharge   

## 2021-05-30 NOTE — Telephone Encounter (Signed)
Please add on at 4:30 06/07/20, 4:30PM, and advise pt of the appt.

## 2021-05-30 NOTE — Progress Notes (Signed)
Discharge instructions (including medications) discussed with and copy provided to patient/caregiver 

## 2021-05-30 NOTE — Discharge Summary (Signed)
Discharge Summary  Karen [place] XBM:[id number] DOB: 28-Apr-1956  PCP: Haywood Pao, MD  Admit date: 05/26/2021 Discharge date: 05/30/2021  Time spent: 70mins, more than 50% time spent on coordination of care.   Recommendations for Outpatient Follow-up:  F/u with PCP within a week  for hospital discharge follow up, repeat cbc/bmp at follow up F/u with endocrinology Dr. Renato Shin for hyperthyroidism, staff message sent to Dr. Loanne Drilling Follow-up with cardiology/A. Fib, ambulatory referral and staff message sent to cardiology Outpatient biopsy for thyroid nodule referral made, discussed with IR over the phone prior to discharge, per IR patient does not need to hold eliquis for biopsy     Discharge Diagnoses:  Active Hospital Problems   Diagnosis Date Noted   Atrial fibrillation with rapid ventricular response ([place]) 05/26/2021   AKI (acute kidney injury) ([place]) 05/27/2021   COVID-19 virus detected 05/27/2021   Atrial fibrillation with RVR ([place]) 05/27/2021   Abnormal TSH 05/27/2021   Abdominal pain    Palpitations 04/01/2016   Obstructive sleep apnea 12/28/2015   GERD (gastroesophageal reflux disease) 07/03/2015   Asthma 06/10/2011   Essential hypertension 09/18/2007    Resolved Hospital Problems  No resolved problems to display.    Discharge Condition: stable  Diet recommendation: heart healthy/low fat diet  Filed Weights   05/28/21 1500  Weight: 86.5 kg    History of present illness: (Per admitting MD Dr. Myna Hidalgo) Patient coming from: Home    Chief Complaint: Abdominal pain    HPI: Karen Ferguson is a very pleasant 66 y.o. female with medical history significant for hypertension, asthma, and chronic abdominal discomfort, now presenting to the emergency department for evaluation of abdominal pain.  The patient reports that she has suffered with frequent episodes of abdominal discomfort for many years but the current episode, while same in character, is more severe than  usual.  The discomfort started this evening, is generalized but most notable in the upper abdomen, and not associated with vomiting, diarrhea, fever, or chills.  She reports only occasional alcohol use but had several drinks at a [employer].  She denies any chest pain, shortness breath, or wheezing.  She had an episode of intense palpitations a few nights ago while she was trying to sleep that resolved on its own after several minutes.  She wore a Holter monitor in 2017 which documented sinus rhythm with PACs and PVCs but no atrial fibrillation or pauses.  She denies any syncope.   ED Course: Upon arrival to the ED, patient is found to be afebrile, saturating well on room air, and with normal heart rate and blood pressure initially.  She later became tachycardic to the 130s and was noted to be in atrial fibrillation.  CT of the abdomen and pelvis is negative for acute findings.  Blood work notable for creatinine 1.40 and lipase 70.  COVID PCR returned positive.  Patient was given a liter of saline, morphine, Zofran, Eliquis, IV Lopressor, and started on diltiazem infusion in the ED.    Hospital Course:  Principal Problem:   Atrial fibrillation with rapid ventricular response (H[facility]) Active Problems:   Essential hypertension   Asthma   GERD (gastroesophageal reflux disease)   Obstructive sleep apnea   Palpitations   AKI (acute kidney injury) ([place])   COVID-19 virus detected   Abdominal pain   Atrial fibrillation with RVR (H[facility])   Abnormal TSH   Afib/RVR , new diagnosis ,presents on admission -Pt reported intense palpitations while trying to  sleep a few nights ago but no documented hx of a fib.  Unclear precipitant, possibly COVID though she has minimal symptoms.  -Echo from 2018 with no significant valve disease and no atrial enlargement  -CHADS-VASc at least 3 (age, gender, HTN)  --started on Eliquis and IV diltiazem started in ED --off cardizem drip, Started on oral metoprolol  -She  converted to sinus rhythm and remained in sinus rhythm, she is discharged on metoprolol and Eliquis -Close follow-up with cardiology, ambulatory referral and staff message sent to cardiology   Hyperthyroidism  thyroid US "1. Nodule 1 (TI-RADS 4), measuring 2.5 x 1.5 x 1.9 cm, located in the isthmus, meets criteria for FNA. 2. Nodule 2 (TI-RADS 3), measuring 1.6 x 1.1 x 1.4 cm, located in the mid right thyroid lobe, meets criteria for imaging follow-up. Annual ultrasound surveillance is recommended until 5 years of stability is documented." -Case discussed with IR will recommend outpatient fine-needle biopsy, does not need to hold Eliquis for biopsy, ambulatory referral made -Message sent to endocrinology Dr. Renato Shin   Abdominal pain- (present on admission) Pt has hx of chronic abdominal discomfort, presented with worsened abd pain, more intense than usual.  Possibly related to Covid infection vs mild pancreatitis or gastritis. Reassuring that abdominal exam benign and no acute findings on CT.  Lipase slightly elevated at 70 -- Continue symptomatic management  -low fat diet  F/u with pcp and gi   Covid 19 with asthma exacerbation Treated with IV steroid, on molnupiravir Appear improving, now on room air,  She desires to go home, she is discharged on  oral steroids taper and mlnupiravir to finish treatment course   HTN Bp stable on current regimen   AKI resolved    Body mass index is 31.73 kg/m.Marland Kitchen     Procedures: none  Consultations: Phone conversation with IR on 1/5  Discharge Exam: BP (!) 148/71 (BP Location: Right Arm)    Pulse 67    Temp 97.8 F (36.6 C) (Oral)    Resp 20    Ht 5\' 5"  (1.651 m)    Wt 86.5 kg    SpO2 100%    BMI 31.73 kg/m   General: NAD, pleasant  Cardiovascular: RRR Respiratory: Normal respiratory effort  Discharge Instructions You were cared for by a hospitalist during your hospital stay. If you have any questions about your discharge  medications or the care you received while you were in the hospital after you are discharged, you can call the unit and asked to speak with the hospitalist on call if the hospitalist that took care of you is not available. Once you are discharged, your primary care physician will handle any further medical issues. Please note that NO REFILLS for any discharge medications will be authorized once you are discharged, as it is imperative that you return to your primary care physician (or establish a relationship with a primary care physician if you do not have one) for your aftercare needs so that they can reassess your need for medications and monitor your lab values.  Discharge Instructions     Amb referral to AFIB Clinic   Complete by: As directed    Diet - low sodium heart healthy   Complete by: As directed    Low fat diet   Increase activity slowly   Complete by: As directed       Allergies as of 05/30/2021       Reactions   Shellfish Allergy Hives  Medication List     STOP taking these medications    amLODipine 5 MG tablet Commonly known as: NORVASC   amoxicillin 500 MG capsule Commonly known as: AMOXIL   naproxen 500 MG tablet Commonly known as: NAPROSYN   oxyCODONE-acetaminophen 5-325 MG tablet Commonly known as: Percocet   predniSONE 10 MG (21) Tbpk tablet Commonly known as: STERAPRED UNI-PAK 21 TAB Replaced by: predniSONE 10 MG tablet       TAKE these medications    acetaminophen 500 MG tablet Commonly known as: TYLENOL Take 500 mg by mouth every 6 (six) hours as needed for mild pain.   albuterol 108 (90 Base) MCG/ACT inhaler Commonly known as: VENTOLIN HFA INHALE 2 PUFFS BY MOUTH 4 TIMES A DAY AS NEEDED FOR SHORTNESS OF BREATH What changed: Another medication with the same name was changed. Make sure you understand how and when to take each.   albuterol 108 (90 Base) MCG/ACT inhaler Commonly known as: VENTOLIN HFA INHALE 2 PUFFS INTO THE LUNGS  FOUR TIMES DAILY AS NEEDED FOR SHORTNESS OF BREATH. What changed:  how much to take how to take this when to take this reasons to take this   apixaban 5 MG Tabs tablet Commonly known as: ELIQUIS Take 1 tablet (5 mg total) by mouth 2 (two) times daily.   diphenhydrAMINE 25 mg capsule Commonly known as: BENADRYL Take 25 mg by mouth every 6 (six) hours as needed for itching.   Linzess 290 MCG Caps capsule Generic drug: linaclotide TAKE 1 CAPSULE BY MOUTH AT LEAST 30 MINUTES BEFORE THE FIRST MEAL OF THE DAY ON AN EMPTY STOMACH What changed: when to take this   methocarbamol 500 MG tablet Commonly known as: ROBAXIN Take 1 tablet (500 mg total) by mouth 2 (two) times daily. What changed:  when to take this reasons to take this   metoprolol tartrate 25 MG tablet Commonly known as: LOPRESSOR Take 1 tablet (25 mg total) by mouth 2 (two) times daily.   molnupiravir EUA 200 mg Caps capsule Commonly known as: LAGEVRIO Take 4 capsules (800 mg total) by mouth 2 (two) times daily for 3 days.   omeprazole 20 MG capsule Commonly known as: PRILOSEC Take 1 capsule (20 mg total) by mouth every morning 30 minutes before a meal.   pantoprazole 40 MG tablet Commonly known as: PROTONIX Take 1 tablet (40 mg total) by mouth daily. What changed: when to take this   predniSONE 10 MG tablet Commonly known as: DELTASONE Take 3 tablets by mouth with breakfast on 1/6, THEN 2 tablets with breakfast on 1/7, THEN 1 tablet with breakfast on 1/8, then stop. Start taking on: May 30, 2021 Replaces: predniSONE 10 MG (21) Tbpk tablet   valsartan-hydrochlorothiazide 80-12.5 MG tablet Commonly known as: DIOVAN-HCT TAKE 1 TABLET BY MOUTH DAILY   Vitamin D3 1.25 MG (50000 UT) Caps TAKE 1 CAPSULE BY MOUTH ONCE A WEEK.       Allergies  Allergen Reactions   Shellfish Allergy Hives    Follow-up Information     Tisovec, Fransico Him, MD Follow up in 1 week(s).   Specialty: Internal Medicine Why:  hospital discharge follow up Contact information: [place] [address] [phone]         Renato Shin, MD Follow up in 3 week(s).   Specialty: Endocrinology Why: for hyperthyroidism Contact information: [address]  [place] [address] [phone]         [facility] Follow  up.   Specialty: Cardiology Why: for afib management and follow up, please call cardiology 's office if you donot hear from the office in three business days Contact information: [address] [place] [phone]                 The results of significant diagnostics from this hospitalization (including imaging, microbiology, ancillary and laboratory) are listed below for reference.    Significant Diagnostic Studies: CT ABDOMEN PELVIS W CONTRAST  Result Date: 05/26/2021 CLINICAL DATA:  Unspecified abdominal pain, pancreatitis EXAM: CT ABDOMEN AND PELVIS WITH CONTRAST TECHNIQUE: Multidetector CT imaging of the abdomen and pelvis was performed using the standard protocol following bolus administration of intravenous contrast. CONTRAST:  23mL OMNIPAQUE IOHEXOL 300 MG/ML  SOLN COMPARISON:  05/11/2019 FINDINGS: Lower chest: No acute abnormality. Hepatobiliary: No focal liver abnormality is seen. Status post cholecystectomy. No biliary dilatation. Pancreas: Unremarkable. No pancreatic ductal dilatation or surrounding inflammatory changes. 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 The spleen is otherwise unremarkable. Adrenals/Urinary Tract: Adrenal glands are unremarkable. The kidneys are normal in size and position. Subcentimeter hypodensity within the left kidney likely represent tiny cortical cysts are stable since prior  examination. The kidneys are otherwise unremarkable. Bladder unremarkable. Stomach/Bowel: Moderate descending colonic diverticulosis. The stomach, small bowel, and large bowel are otherwise unremarkable. Appendix normal. No free intraperitoneal gas or fluid. Vascular/Lymphatic: No significant vascular findings are present. No enlarged abdominal or pelvic lymph nodes. Reproductive: Status post hysterectomy. No adnexal masses. Other: No abdominal wall hernia or abnormality. No abdominopelvic ascites. Musculoskeletal: No acute bone abnormality. No lytic or blastic bone lesion. IMPRESSION: No acute intra-abdominal pathology identified. No CT evidence of acute pancreatitis. 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 Descending colonic diverticulosis without superimposed acute inflammatory change. Electronically Signed   By: Fidela Salisbury M.D.   On: 05/26/2021 21:58   US THYROID  Result Date: 05/30/2021 CLINICAL DATA:  Hyperthyroidism EXAM: THYROID ULTRASOUND TECHNIQUE: Ultrasound examination of the thyroid gland and adjacent soft tissues was performed. COMPARISON:  None. FINDINGS: Parenchymal Echotexture: Mildly heterogeneous Isthmus: 0.7 cm Right lobe: 3.0 x 1.6 x 1.5 cm Left lobe: 4.3 x 2.4 x 2.4 cm _________________________________________________________ Estimated total number of nodules >/= 1 cm: 3 Number of spongiform nodules >/=  2 cm not described below (TR1): 0 Number of mixed cystic and solid nodules >/= 1.5 cm not described below (TR2): 0 _________________________________________________________ Nodule # 1: Location: Isthmus; superior Maximum size: 2.5 cm; Other 2 dimensions: 1.5 x 1.9 cm Composition: solid/almost completely solid (2) Echogenicity: isoechoic (1) Shape: not taller-than-wide (0) Margins: smooth (0) Echogenic foci: punctate echogenic foci (3) ACR TI-RADS total points: 6. ACR TI-RADS risk category: TR4  (4-6 points). ACR TI-RADS recommendations: **Given size (>/= 1.5 cm) and appearance, fine needle aspiration of this moderately suspicious nodule should be considered based on TI-RADS criteria. _________________________________________________________ Nodule # 2: Location: Right; mid Maximum size: 1.6 cm; Other 2 dimensions: 1.1 x 1.4 cm Composition: solid/almost completely solid (2) Echogenicity: isoechoic (1) Shape: not taller-than-wide (0) Margins: smooth (0) Echogenic foci: none (0) ACR TI-RADS total points: 3. ACR TI-RADS risk category: TR3 (3 points). ACR TI-RADS recommendations: *Given size (>/= 1.5 - 2.4 cm) and appearance, a follow-up ultrasound in 1 year should be considered based on  TI-RADS criteria. _________________________________________________________ Nodule 3: 2 2.6 x 2.3 x 2.3 cm heterogeneous region in the mid left thyroid lobe is favored to be a pseudo nodule given lack of defined margins. IMPRESSION: 1. Nodule 1 (TI-RADS 4), measuring 2.5 x 1.5 x 1.9 cm, located in the isthmus, meets criteria for FNA. 2. Nodule 2 (TI-RADS 3), measuring 1.6 x 1.1 x 1.4 cm, located in the mid right thyroid lobe, meets criteria for imaging follow-up. Annual ultrasound surveillance is recommended until 5 years of stability is documented. The above is in keeping with the ACR TI-RADS recommendations - J Am Coll Radiol 2017;14:587-595. Electronically Signed   By: Miachel Roux M.D.   On: 05/30/2021 07:55    Microbiology: Recent Results (from the past 240 hour(s))  Resp Panel by RT-PCR (Flu A&B, Covid) Nasopharyngeal Swab     Status: Abnormal   Collection Time: 05/26/21 11:55 PM   Specimen: Nasopharyngeal Swab; Nasopharyngeal(NP) swabs in vial transport medium  Result Value Ref Range Status   SARS Coronavirus 2 by RT PCR POSITIVE (A) NEGATIVE Final    Comment: (NOTE) SARS-CoV-2 target nucleic acids are DETECTED.  The SARS-CoV-2 RNA is generally detectable in upper respiratory specimens during the acute phase  of infection. Positive results are indicative of the presence of the identified virus, but do not rule out bacterial infection or co-infection with other pathogens not detected by the test. Clinical correlation with patient history and other diagnostic information is necessary to determine patient infection status. The expected result is Negative.  Fact Sheet for Patients: EntrepreneurPulse.com.au  Fact Sheet for Healthcare Providers: IncredibleEmployment.be  This test is not yet approved or cleared by the [place] FDA and  has been authorized for detection and/or diagnosis of SARS-CoV-2 by FDA under an Emergency Use Authorization (EUA).  This EUA will remain in effect (meaning this test can be used) for the duration of  the COVID-19 declaration under Section 564(b)(1) of the A ct, 21 U.S.C. section 360bbb-3(b)(1), unless the authorization is terminated or revoked sooner.     Influenza A by PCR NEGATIVE NEGATIVE Final   Influenza B by PCR NEGATIVE NEGATIVE Final    Comment: (NOTE) The Xpert Xpress SARS-CoV-2/FLU/RSV plus assay is intended as an aid in the diagnosis of influenza from Nasopharyngeal swab specimens and should not be used as a sole basis for treatment. Nasal washings and aspirates are unacceptable for Xpert Xpress SARS-CoV-2/FLU/RSV testing.  Fact Sheet for Patients: EntrepreneurPulse.com.au  Fact Sheet for Healthcare Providers: IncredibleEmployment.be  This test is not yet approved or cleared by the [place] FDA and has been authorized for detection and/or diagnosis of SARS-CoV-2 by FDA under an Emergency Use Authorization (EUA). This EUA will remain in effect (meaning this test can be used) for the duration of the COVID-19 declaration under Section 564(b)(1) of the Act, 21 U.S.C. section 360bbb-3(b)(1), unless the authorization is terminated or revoked.  Performed at [facility], [place] [address], [place], [place] [address]      Labs: Basic Metabolic Panel: Recent Labs  Lab 05/26/21 1920 05/27/21 0300 05/28/21 0749 05/29/21 0514 05/30/21 0407  NA 138 138 141 139 138  K 3.5 3.9 3.8 4.3 4.4  CL 104 108 110 107 106  CO2 26 26 26 25 27   GLUCOSE 131* 120* 84 118* 130*  BUN 26* 22 15 14 19   CREATININE 1.40* 1.00 0.74 0.62 0.64  CALCIUM 10.3 9.7 9.6 10.1 9.8  MG  --  1.6*  --  1.7 1.8  Liver Function Tests: Recent Labs  Lab 05/26/21 1920 05/27/21 0300 05/28/21 0749 05/29/21 0514  AST 24 123* 33 23  ALT 27 70* 44 38  ALKPHOS 69 100 79 83  BILITOT 0.5 0.4 0.2* 0.6  PROT 6.7 6.2* 5.9* 6.4*  ALBUMIN 3.2* 2.8* 2.6* 2.9*   Recent Labs  Lab 05/26/21 1920  LIPASE 70*   No results for input(s): AMMONIA in the last 168 hours. CBC: Recent Labs  Lab 05/26/21 1920 05/27/21 0300 05/28/21 0749 05/29/21 0514  WBC 6.8 5.8 4.2 3.7*  NEUTROABS 3.8  --  2.0  --   HGB 11.2* 10.7* 9.4* 10.1*  HCT 33.6* 32.5* 28.8* 30.5*  MCV 95.5 95.6 96.6 93.8  PLT 255 239 181 195   Cardiac Enzymes: No results for input(s): CKTOTAL, CKMB, CKMBINDEX, TROPONINI in the last 168 hours. BNP: BNP (last 3 results) No results for input(s): BNP in the last 8760 hours.  ProBNP (last 3 results) No results for input(s): PROBNP in the last 8760 hours.  CBG: No results for input(s): GLUCAP in the last 168 hours.     Signed:  Florencia Reasons MD, PhD, FACP  [facility] 05/30/2021, 9:41 AM

## 2021-05-31 ENCOUNTER — Other Ambulatory Visit (HOSPITAL_COMMUNITY): Payer: Self-pay

## 2021-06-05 ENCOUNTER — Ambulatory Visit (INDEPENDENT_AMBULATORY_CARE_PROVIDER_SITE_OTHER): Payer: Commercial Managed Care - HMO | Admitting: Endocrinology

## 2021-06-05 ENCOUNTER — Other Ambulatory Visit: Payer: Self-pay

## 2021-06-05 ENCOUNTER — Other Ambulatory Visit (HOSPITAL_COMMUNITY): Payer: Self-pay

## 2021-06-05 ENCOUNTER — Encounter: Payer: Self-pay | Admitting: Endocrinology

## 2021-06-05 VITALS — BP 100/58 | HR 68 | Ht 65.0 in | Wt 182.4 lb

## 2021-06-05 DIAGNOSIS — R7989 Other specified abnormal findings of blood chemistry: Secondary | ICD-10-CM | POA: Diagnosis not present

## 2021-06-05 MED ORDER — METHIMAZOLE 10 MG PO TABS
30.0000 mg | ORAL_TABLET | Freq: Two times a day (BID) | ORAL | 5 refills | Status: DC
Start: 2021-06-05 — End: 2021-07-02
  Filled 2021-06-05: qty 180, 30d supply, fill #0

## 2021-06-05 NOTE — Patient Instructions (Addendum)
I have sent a prescription to your pharmacy, to slow the thyroid.  If ever you have fever while taking methimazole, stop it and call us, even if the reason is obvious, because of the risk of a rare side-effect.  It is best to never miss the medication.  However, if you do miss it, next best is to double up the next time.   Given the overactive thyroid, your chances of thyroid cancer are low.  However, it is still fine with me to do the biopsy.  It is easy. Please come back for a follow-up appointment in 3 weeks.

## 2021-06-05 NOTE — Progress Notes (Signed)
Subjective:    Patient ID: Karen Ferguson, female    DOB: 09/09/1955, 66 y.o.   MRN: [id number]  HPI Pt is referred by Dr Erlinda Hong, for hyperthyroidism.  Pt reports he was dx'ed with hyperthyroidism in early 2023 (TSH was normal 1 year ago).  she has never been on therapy for this.  she has never had XRT to the anterior neck, or thyroid surgery.  she does not consume kelp non-prescribed thyroid medication.  she has never been on amiodarone.  She reports abd bloating, anxiety, palpitations, and headache.  She has lost 15 lbs recently.   Past Medical History:  Diagnosis Date   Arthritis    Asthma    Albuterol prn   Bronchitis    Degenerative disc disease    Depression    Diabetes mellitus without complication ([place])    Borderline diabetic in the past per pt   History of echocardiogram    Echo 1/18: EF 60-65, no RWMA, Gr 1 DD, PASP 20   Hypertension    PVC's (premature ventricular contractions)    Holter 12/17: NSR, occ PAC/PVCs, no AFib; one 4 beat run NSVT    Past Surgical History:  Procedure Laterality Date   ABDOMINAL HYSTERECTOMY  1988   for fibroid tumors   CARPAL TUNNEL RELEASE  1985   rt   [place] ARTHROSCOPY Right    TENDON REPAIR Right 03/03/2013   Procedure: RIGHT DEBRIDEMENT AND TENOLYSIS OF PERONEOUS LONGOUS AND BREVIS TENDONS ;  Surgeon: Wylene Simmer, MD;  Location: [place];  Service: Orthopedics;  Laterality: Right;   TOOTH EXTRACTION N/A 03/25/2019   Procedure: DENTAL EXTRACTIONS TEETH NUMBER THREE, FOUR, FIVE, SIX, SEVEN, NINE, TWELVE, FOURTEEN, THIRTY AND ALVEOLOPLASTY;  Surgeon: Diona Browner, DDS;  Location: [facility] [facility];  Service: Oral Surgery;  Laterality: N/A;    Social History   Socioeconomic History   Marital status: Single    Spouse name: Not on file   Number of children: 1   Years of education: Not on file   Highest education level: Not on file  Occupational History   Not on file  Tobacco Use    Smoking status: Never   Smokeless tobacco: Never  Vaping Use   Vaping Use: Never used  Substance and Sexual Activity   Alcohol use: Yes    Comment: occasional   Drug use: No   Sexual activity: Not Currently  Other Topics Concern   Not on file  Social History Narrative   Works at [employer] ([employer]) in [employer].     Single   1 daughter - passed away at age 23   Lives with her 2 grandchildren.   Right Handed   Social Determinants of Health   Financial Resource Strain: Not on file  Food Insecurity: Not on file  Transportation Needs: Not on file  Physical Activity: Not on file  Stress: Not on file  Social Connections: Not on file  Intimate Partner Violence: Not on file    Current Outpatient Medications on File Prior to Visit  Medication Sig Dispense Refill   acetaminophen (TYLENOL) 500 MG tablet Take 500 mg by mouth every 6 (six) hours as needed for mild pain.     albuterol (VENTOLIN HFA) 108 (90 Base) MCG/ACT inhaler INHALE 2 PUFFS INTO THE LUNGS FOUR TIMES DAILY AS NEEDED FOR SHORTNESS OF BREATH. (Patient taking differently: Inhale 2 puffs into the lungs every 6 (six)  hours as needed for wheezing or shortness of breath.) 8.5 g 3   apixaban (ELIQUIS) 5 MG TABS tablet Take 1 tablet (5 mg total) by mouth 2 (two) times daily. 60 tablet 2   Cholecalciferol (VITAMIN D3) 1.25 MG (50000 UT) CAPS TAKE 1 CAPSULE BY MOUTH ONCE A WEEK. 12 capsule 0   diphenhydrAMINE (BENADRYL) 25 mg capsule Take 25 mg by mouth every 6 (six) hours as needed for itching.     linaclotide (LINZESS) 290 MCG CAPS capsule TAKE 1 CAPSULE BY MOUTH AT LEAST 30 MINUTES BEFORE THE FIRST MEAL OF THE DAY ON AN EMPTY STOMACH (Patient taking differently: Take 290 mcg by mouth daily before breakfast.) 90 capsule 3   methocarbamol (ROBAXIN) 500 MG tablet Take 1 tablet (500 mg total) by mouth 2 (two) times daily. (Patient taking differently: Take 500 mg by mouth 3 (three) times daily as needed for muscle spasms.) 20  tablet 0   metoprolol tartrate (LOPRESSOR) 25 MG tablet Take 1 tablet (25 mg total) by mouth 2 (two) times daily. 60 tablet 2   omeprazole (PRILOSEC) 20 MG capsule Take 1 capsule (20 mg total) by mouth every morning 30 minutes before a meal. 30 capsule 0   pantoprazole (PROTONIX) 40 MG tablet Take 1 tablet (40 mg total) by mouth daily. 30 tablet 0   valsartan-hydrochlorothiazide (DIOVAN-HCT) 80-12.5 MG tablet TAKE 1 TABLET BY MOUTH DAILY 90 tablet 3   albuterol (VENTOLIN HFA) 108 (90 Base) MCG/ACT inhaler INHALE 2 PUFFS BY MOUTH 4 TIMES A DAY AS NEEDED FOR SHORTNESS OF BREATH 8.5 g 3   No current facility-administered medications on file prior to visit.    Allergies  Allergen Reactions   Shellfish Allergy Hives    Family History  Problem Relation Age of Onset   Thyroid disease Mother    Arthritis Mother    Hypertension Mother    Diabetes Mother    Kidney disease Mother    Arrhythmia Mother        AFib   Thyroid disease Sister    Arthritis Sister    Heart disease Sister    Hypertension Brother    Heart disease Maternal Grandfather    Heart attack Maternal Grandfather 84       MI   Heart disease Maternal Aunt    Heart failure Maternal Aunt    Heart disease Maternal Uncle    Cancer Maternal Uncle     BP (!) 100/58 (BP Location: Left Arm, Patient Position: Sitting, Cuff Size: Normal)    Pulse 68    Ht 5\' 5"  (1.651 m)    Wt 182 lb 6.4 oz (82.7 kg)    SpO2 98%    BMI 30.35 kg/m     Review of Systems denies excessive diaphoresis, tremor, and heat intolerance.      Objective:   Physical Exam VS: see vs page GEN: no distress HEAD: head: no deformity eyes: no periorbital swelling, no proptosis external nose and ears are normal NECK: supple, thyroid is not enlarged CHEST WALL: no deformity LUNGS: clear to auscultation CV: reg rate and rhythm, no murmur.  MUSCULOSKELETAL: gait is normal and steady EXTEMITIES: no deformity.  no leg edema NEURO:  readily moves all 4's.   sensation is intact to touch on all 4's.  No tremor SKIN:  Normal texture and temperature.  No rash or suspicious lesion is visible.  Not diaphoretic.  NODES:  None palpable at the neck PSYCH: alert, well-oriented.  Does not appear anxious nor depressed.  Lab Results  Component Value Date   TSH <0.010 (L) 05/27/2021   [place] (2023) 1. Nodule 1 (TI-RADS 4), measuring 2.5 x 1.5 x 1.9 cm, located in the isthmus, meets criteria for FNA. 2. Nodule 2 (TI-RADS 3), measuring 1.6 x 1.1 x 1.4 cm, located in the mid right thyroid lobe, meets criteria for imaging follow-up.  I have reviewed outside records, and summarized: Pt was noted to have abnormal TFT, and referred here.  She was in the hosp for abd pain + COVID, and was noted to have AF.        Assessment & Plan:  Hyperthyroidism: uncontrolled MNG: this decreases the risk of cancer.   Patient Instructions  I have sent a prescription to your pharmacy, to slow the thyroid.  If ever you have fever while taking methimazole, stop it and call us, even if the reason is obvious, because of the risk of a rare side-effect.  It is best to never miss the medication.  However, if you do miss it, next best is to double up the next time.   Given the overactive thyroid, your chances of thyroid cancer are low.  However, it is still fine with me to do the biopsy.  It is easy. Please come back for a follow-up appointment in 3 weeks.

## 2021-06-07 ENCOUNTER — Other Ambulatory Visit (HOSPITAL_COMMUNITY): Payer: Self-pay

## 2021-06-17 ENCOUNTER — Observation Stay (HOSPITAL_COMMUNITY)
Admission: EM | Admit: 2021-06-17 | Discharge: 2021-06-19 | Disposition: A | Discharge status: Home / Self Care | Payer: Medicare Other | Attending: Internal Medicine | Admitting: Internal Medicine

## 2021-06-17 ENCOUNTER — Other Ambulatory Visit: Payer: Self-pay

## 2021-06-17 ENCOUNTER — Encounter (HOSPITAL_COMMUNITY): Payer: Self-pay | Admitting: Emergency Medicine

## 2021-06-17 DIAGNOSIS — R531 Weakness: Secondary | ICD-10-CM

## 2021-06-17 DIAGNOSIS — E042 Nontoxic multinodular goiter: Secondary | ICD-10-CM | POA: Insufficient documentation

## 2021-06-17 DIAGNOSIS — Z8616 Personal history of COVID-19: Secondary | ICD-10-CM | POA: Diagnosis not present

## 2021-06-17 DIAGNOSIS — I4891 Unspecified atrial fibrillation: Secondary | ICD-10-CM | POA: Insufficient documentation

## 2021-06-17 DIAGNOSIS — K219 Gastro-esophageal reflux disease without esophagitis: Secondary | ICD-10-CM | POA: Diagnosis not present

## 2021-06-17 DIAGNOSIS — Z7901 Long term (current) use of anticoagulants: Secondary | ICD-10-CM | POA: Diagnosis not present

## 2021-06-17 DIAGNOSIS — R112 Nausea with vomiting, unspecified: Secondary | ICD-10-CM | POA: Diagnosis present

## 2021-06-17 DIAGNOSIS — E669 Obesity, unspecified: Secondary | ICD-10-CM | POA: Diagnosis not present

## 2021-06-17 DIAGNOSIS — R109 Unspecified abdominal pain: Secondary | ICD-10-CM

## 2021-06-17 DIAGNOSIS — E041 Nontoxic single thyroid nodule: Secondary | ICD-10-CM | POA: Diagnosis present

## 2021-06-17 DIAGNOSIS — E119 Type 2 diabetes mellitus without complications: Secondary | ICD-10-CM | POA: Insufficient documentation

## 2021-06-17 DIAGNOSIS — J45909 Unspecified asthma, uncomplicated: Secondary | ICD-10-CM | POA: Diagnosis not present

## 2021-06-17 DIAGNOSIS — N179 Acute kidney failure, unspecified: Secondary | ICD-10-CM | POA: Diagnosis not present

## 2021-06-17 DIAGNOSIS — Z79899 Other long term (current) drug therapy: Secondary | ICD-10-CM | POA: Insufficient documentation

## 2021-06-17 DIAGNOSIS — E0590 Thyrotoxicosis, unspecified without thyrotoxic crisis or storm: Secondary | ICD-10-CM | POA: Diagnosis not present

## 2021-06-17 DIAGNOSIS — G8929 Other chronic pain: Secondary | ICD-10-CM | POA: Diagnosis present

## 2021-06-17 DIAGNOSIS — R7989 Other specified abnormal findings of blood chemistry: Secondary | ICD-10-CM

## 2021-06-17 DIAGNOSIS — I10 Essential (primary) hypertension: Secondary | ICD-10-CM | POA: Insufficient documentation

## 2021-06-17 DIAGNOSIS — R051 Acute cough: Secondary | ICD-10-CM

## 2021-06-17 NOTE — ED Triage Notes (Signed)
Pt BIB EMS from home c/o weakness. Pt report increase weakness since she got discharge 2 weeks ago. 750ML NS bolus given By EMS. Pt denies N/V/D. Pt a/ox4.   BP 85/60 HR 90 RR 16 O2sat 98% on RA

## 2021-06-18 ENCOUNTER — Emergency Department (HOSPITAL_COMMUNITY): Payer: Medicare Other

## 2021-06-18 ENCOUNTER — Observation Stay (HOSPITAL_COMMUNITY): Payer: Medicare Other

## 2021-06-18 DIAGNOSIS — N179 Acute kidney failure, unspecified: Secondary | ICD-10-CM

## 2021-06-18 LAB — COMPREHENSIVE METABOLIC PANEL
ALT: 24 U/L (ref 0–44)
AST: 23 U/L (ref 15–41)
Albumin: 3.3 g/dL — ABNORMAL LOW (ref 3.5–5.0)
Alkaline Phosphatase: 72 U/L (ref 38–126)
Anion gap: 11 (ref 5–15)
BUN: 87 mg/dL — ABNORMAL HIGH (ref 8–23)
CO2: 20 mmol/L — ABNORMAL LOW (ref 22–32)
Calcium: 10.1 mg/dL (ref 8.9–10.3)
Chloride: 104 mmol/L (ref 98–111)
Creatinine, Ser: 1.58 mg/dL — ABNORMAL HIGH (ref 0.44–1.00)
GFR, Estimated: 36 mL/min — ABNORMAL LOW (ref >60)
Glucose, Bld: 102 mg/dL — ABNORMAL HIGH (ref 70–99)
Potassium: 3.9 mmol/L (ref 3.5–5.1)
Sodium: 135 mmol/L (ref 135–145)
Total Bilirubin: 0.8 mg/dL (ref 0.3–1.2)
Total Protein: 7.3 g/dL (ref 6.5–8.1)

## 2021-06-18 LAB — CBC WITH DIFFERENTIAL/PLATELET
Abs Immature Granulocytes: 0.02 10*3/uL (ref 0.00–0.07)
Basophils Absolute: 0 10*3/uL (ref 0.0–0.1)
Basophils Relative: 0 %
Eosinophils Absolute: 0.1 10*3/uL (ref 0.0–0.5)
Eosinophils Relative: 2 %
HCT: 31.9 % — ABNORMAL LOW (ref 36.0–46.0)
Hemoglobin: 10.5 g/dL — ABNORMAL LOW (ref 12.0–15.0)
Immature Granulocytes: 0 %
Lymphocytes Relative: 26 %
Lymphs Abs: 2 10*3/uL (ref 0.7–4.0)
MCH: 30.3 pg (ref 26.0–34.0)
MCHC: 32.9 g/dL (ref 30.0–36.0)
MCV: 91.9 fL (ref 80.0–100.0)
Monocytes Absolute: 1 10*3/uL (ref 0.1–1.0)
Monocytes Relative: 14 %
Neutro Abs: 4.3 10*3/uL (ref 1.7–7.7)
Neutrophils Relative %: 58 %
Platelets: 135 10*3/uL — ABNORMAL LOW (ref 150–400)
RBC: 3.47 MIL/uL — ABNORMAL LOW (ref 3.87–5.11)
RDW: 12 % (ref 11.5–15.5)
WBC: 7.5 10*3/uL (ref 4.0–10.5)
nRBC: 0 % (ref 0.0–0.2)

## 2021-06-18 LAB — LIPASE, BLOOD: Lipase: 75 U/L — ABNORMAL HIGH (ref 11–51)

## 2021-06-18 LAB — URINALYSIS, ROUTINE W REFLEX MICROSCOPIC
Bilirubin Urine: NEGATIVE
Glucose, UA: NEGATIVE mg/dL
Hgb urine dipstick: NEGATIVE
Ketones, ur: NEGATIVE mg/dL
Leukocytes,Ua: NEGATIVE
Nitrite: NEGATIVE
Protein, ur: NEGATIVE mg/dL
Specific Gravity, Urine: 1.013 (ref 1.005–1.030)
pH: 5 (ref 5.0–8.0)

## 2021-06-18 LAB — TSH: TSH: <0.01 u[IU]/mL — ABNORMAL LOW (ref 0.350–4.500)

## 2021-06-18 LAB — TROPONIN I (HIGH SENSITIVITY)
Troponin I (High Sensitivity): 9 ng/L (ref <18)
Troponin I (High Sensitivity): 10 ng/L (ref <18)

## 2021-06-18 LAB — MAGNESIUM: Magnesium: 1.8 mg/dL (ref 1.7–2.4)

## 2021-06-18 LAB — LACTIC ACID, PLASMA: Lactic Acid, Venous: 1.1 mmol/L (ref 0.5–1.9)

## 2021-06-18 MED ORDER — ACETAMINOPHEN 650 MG RE SUPP: 650.0000 mg | Freq: Four times a day (QID) | RECTAL | Status: DC | PRN

## 2021-06-18 MED ORDER — ONDANSETRON HCL 4 MG PO TABS: 4.0000 mg | ORAL_TABLET | Freq: Four times a day (QID) | ORAL | Status: DC | PRN

## 2021-06-18 MED ORDER — HYDROCODONE-ACETAMINOPHEN 5-325 MG PO TABS: 1.0000 | ORAL_TABLET | ORAL | Status: DC | PRN

## 2021-06-18 MED ORDER — METHIMAZOLE 10 MG PO TABS: 30.0000 mg | ORAL_TABLET | Freq: Two times a day (BID) | ORAL | Status: DC

## 2021-06-18 MED ORDER — ACETAMINOPHEN 325 MG PO TABS: 650.0000 mg | ORAL_TABLET | Freq: Four times a day (QID) | ORAL | Status: DC | PRN

## 2021-06-18 MED ORDER — SODIUM CHLORIDE 0.9 % IV BOLUS
1000.0000 mL | Freq: Once | INTRAVENOUS | Status: AC
  Administered 2021-06-18: 08:00:00 1000 mL via INTRAVENOUS

## 2021-06-18 MED ORDER — SODIUM CHLORIDE 0.9 % IV BOLUS
1000.0000 mL | Freq: Once | INTRAVENOUS | Status: AC
  Administered 2021-06-18: 10:00:00 1000 mL via INTRAVENOUS

## 2021-06-18 MED ORDER — ONDANSETRON HCL 4 MG/2ML IJ SOLN: 4.0000 mg | Freq: Four times a day (QID) | INTRAMUSCULAR | Status: DC | PRN

## 2021-06-18 MED ORDER — APIXABAN 5 MG PO TABS
5.0000 mg | ORAL_TABLET | Freq: Two times a day (BID) | ORAL | Status: DC
  Administered 2021-06-18 – 2021-06-19 (×3): 5 mg via ORAL
  Filled 2021-06-18 (×3): qty 1

## 2021-06-18 MED ORDER — PANTOPRAZOLE SODIUM 40 MG PO TBEC
40.0000 mg | DELAYED_RELEASE_TABLET | Freq: Every day | ORAL | Status: DC
  Administered 2021-06-18 – 2021-06-19 (×2): 40 mg via ORAL
  Filled 2021-06-18 (×2): qty 1

## 2021-06-18 MED ORDER — GUAIFENESIN-DM 100-10 MG/5ML PO SYRP
5.0000 mL | ORAL_SOLUTION | ORAL | Status: DC | PRN
  Administered 2021-06-18 – 2021-06-19 (×2): 5 mL via ORAL
  Filled 2021-06-18 (×2): qty 10

## 2021-06-18 MED ORDER — ALBUTEROL SULFATE HFA 108 (90 BASE) MCG/ACT IN AERS
2.0000 | INHALATION_SPRAY | Freq: Four times a day (QID) | RESPIRATORY_TRACT | Status: DC | PRN
  Filled 2021-06-18: qty 6.7

## 2021-06-18 MED ORDER — SODIUM CHLORIDE 0.9 % IV SOLN: INTRAVENOUS | Status: DC

## 2021-06-18 NOTE — ED Notes (Signed)
ED TO INPATIENT HANDOFF REPORT  ED Nurse Name and Phone #:   S Name/Age/Gender Karen Ferguson 66 y.o. female Room/Bed: WA14/WA14  Code Status   Code Status: Full Code  Home/SNF/Other Home Patient oriented to: self, place, time, and situation Is this baseline? Yes   Triage Complete: Triage complete  Chief Complaint AKI (acute kidney injury) ([place]) [N17.9]  Triage Note Pt BIB EMS from home c/o weakness. Pt report increase weakness since she got discharge 2 weeks ago. 750ML NS bolus given By EMS. Pt denies N/V/D. Pt a/ox4.   BP 85/60 HR 90 RR 16 O2sat 98% on RA    Allergies Allergies  Allergen Reactions   Shellfish Allergy Hives    Level of Care/Admitting Diagnosis ED Disposition     ED Disposition  Admit   Condition  --   Comment  Hospital Area: [place] [[id number]]  Level of Care: Telemetry [5]  Admit to tele based on following criteria: Monitor for Ischemic changes  May place patient in observation at [facility] if equivalent level of care is available:: No  Covid Evaluation: Asymptomatic Screening Protocol (No Symptoms)  Diagnosis: AKI (acute kidney injury) [facility]) [[id number]]  Admitting Physician: Jonnie Finner [[id number]]  Attending Physician: Jonnie Finner [[id number]]          B Medical/Surgery History Past Medical History:  Diagnosis Date   Arthritis    Asthma    Albuterol prn   Bronchitis    Degenerative disc disease    Depression    Diabetes mellitus without complication ([place])    Borderline diabetic in the past per pt   History of echocardiogram    Echo 1/18: EF 60-65, no RWMA, Gr 1 DD, PASP 20   Hypertension    PVC's (premature ventricular contractions)    Holter 12/17: NSR, occ PAC/PVCs, no AFib; one 4 beat run NSVT   Past Surgical History:  Procedure Laterality Date   ABDOMINAL HYSTERECTOMY  1988   for fibroid tumors   [place] ARTHROSCOPY Right    TENDON REPAIR Right 03/03/2013   Procedure: RIGHT DEBRIDEMENT AND TENOLYSIS OF PERONEOUS LONGOUS AND BREVIS TENDONS ;  Surgeon: Wylene Simmer, MD;  Location: [place];  Service: Orthopedics;  Laterality: Right;   TOOTH EXTRACTION N/A 03/25/2019   Procedure: DENTAL EXTRACTIONS TEETH NUMBER THREE, FOUR, FIVE, SIX, SEVEN, NINE, TWELVE, FOURTEEN, THIRTY AND ALVEOLOPLASTY;  Surgeon: Diona Browner, DDS;  Location: [place];  Service: Oral Surgery;  Laterality: N/A;     A IV Location/Drains/Wounds Patient Lines/Drains/Airways Status     Active Line/Drains/Airways     Name Placement date Placement time Site Days   Peripheral IV 06/18/21 18 G 1" Left Antecubital 06/18/21  0036  Antecubital  less than 1   Incision 03/03/13 Leg Right 03/03/13  0927  -- 3029   Incision (Closed) 03/25/19 Lip Other (Comment) 03/25/19  1104  -- 816            Intake/Output Last 24 hours No intake or output data in the 24 hours ending 06/18/21 1651  Labs/Imaging Results for orders placed or performed during the hospital encounter of 06/17/21 (from the past 48 hour(s))  CBC with Differential     Status: Abnormal   Collection Time: 06/18/21 12:37 AM  Result Value Ref Range   WBC 7.5 4.0 - 10.5 K/uL  RBC 3.47 (L) 3.87 - 5.11 MIL/uL   Hemoglobin 10.5 (L) 12.0 - 15.0 g/dL   HCT 31.9 (L) 36.0 - 46.0 %   MCV 91.9 80.0 - 100.0 fL   MCH 30.3 26.0 - 34.0 pg   MCHC 32.9 30.0 - 36.0 g/dL   RDW 12.0 11.5 - 15.5 %   Platelets 135 (L) 150 - 400 K/uL   nRBC 0.0 0.0 - 0.2 %   Neutrophils Relative % 58 %   Neutro Abs 4.3 1.7 - 7.7 K/uL   Lymphocytes Relative 26 %   Lymphs Abs 2.0 0.7 - 4.0 K/uL   Monocytes Relative 14 %   Monocytes Absolute 1.0 0.1 - 1.0 K/uL   Eosinophils Relative 2 %   Eosinophils Absolute 0.1 0.0 - 0.5 K/uL   Basophils Relative 0 %   Basophils Absolute 0.0 0.0 - 0.1 K/uL   Immature Granulocytes 0 %   Abs Immature Granulocytes 0.02 0.00 - 0.07 K/uL     Comment: Performed at [facility], [place] [address], [place], [place] [address]  Comprehensive metabolic panel     Status: Abnormal   Collection Time: 06/18/21 12:37 AM  Result Value Ref Range   Sodium 135 135 - 145 mmol/L   Potassium 3.9 3.5 - 5.1 mmol/L   Chloride 104 98 - 111 mmol/L   CO2 20 (L) 22 - 32 mmol/L   Glucose, Bld 102 (H) 70 - 99 mg/dL    Comment: Glucose reference range applies only to samples taken after fasting for at least 8 hours.   BUN 87 (H) 8 - 23 mg/dL   Creatinine, Ser 1.58 (H) 0.44 - 1.00 mg/dL   Calcium 10.1 8.9 - 10.3 mg/dL   Total Protein 7.3 6.5 - 8.1 g/dL   Albumin 3.3 (L) 3.5 - 5.0 g/dL   AST 23 15 - 41 U/L   ALT 24 0 - 44 U/L   Alkaline Phosphatase 72 38 - 126 U/L   Total Bilirubin 0.8 0.3 - 1.2 mg/dL   GFR, Estimated 36 (L) >60 mL/min    Comment: (NOTE) Calculated using the CKD-EPI Creatinine Equation (2021)    Anion gap 11 5 - 15    Comment: Performed at [facility], [place] [address], [place], [place] [address]  Troponin I (High Sensitivity)     Status: None   Collection Time: 06/18/21 12:37 AM  Result Value Ref Range   Troponin I (High Sensitivity) 9 <18 ng/L    Comment: (NOTE) Elevated high sensitivity troponin I (hsTnI) values and significant  changes across serial measurements may suggest ACS but many other  chronic and acute conditions are known to elevate hsTnI results.  Refer to the "Links" section for chest pain algorithms and additional  guidance. Performed at [facility], [place] [address] [place], [place] [address]   Urinalysis, Routine w reflex microscopic Urine, Clean Catch     Status: Abnormal   Collection Time: 06/18/21  8:23 AM  Result Value Ref Range   Color, Urine YELLOW YELLOW   APPearance HAZY (A) CLEAR   Specific Gravity, Urine 1.013 1.005 - 1.030   pH 5.0 5.0 - 8.0   Glucose, UA NEGATIVE NEGATIVE mg/dL   Hgb urine dipstick NEGATIVE NEGATIVE   Bilirubin Urine  NEGATIVE NEGATIVE   Ketones, ur NEGATIVE NEGATIVE mg/dL   Protein, ur NEGATIVE NEGATIVE mg/dL   Nitrite NEGATIVE NEGATIVE   Leukocytes,Ua NEGATIVE NEGATIVE    Comment: Performed at [facility],  [place] [address], [place], [place] [address]  Troponin I (High Sensitivity)     Status: None   Collection Time: 06/18/21  8:23 AM  Result Value Ref Range   Troponin I (High Sensitivity) 10 <18 ng/L    Comment: (NOTE) Elevated high sensitivity troponin I (hsTnI) values and significant  changes across serial measurements may suggest ACS but many other  chronic and acute conditions are known to elevate hsTnI results.  Refer to the "Links" section for chest pain algorithms and additional  guidance. Performed at [facility], [place] [address], [place], [place] [address]   Lactic acid, plasma     Status: None   Collection Time: 06/18/21  8:23 AM  Result Value Ref Range   Lactic Acid, Venous 1.1 0.5 - 1.9 mmol/L    Comment: Performed at [facility], [place] [address] [place], [place] [address]  Magnesium     Status: None   Collection Time: 06/18/21  8:23 AM  Result Value Ref Range   Magnesium 1.8 1.7 - 2.4 mg/dL    Comment: Performed at [facility], [place] [address], [place], [place] [address]  TSH     Status: Abnormal   Collection Time: 06/18/21  8:23 AM  Result Value Ref Range   TSH <0.010 (L) 0.350 - 4.500 uIU/mL    Comment: Performed by a 3rd Generation assay with a functional sensitivity of <=0.01 uIU/mL. Performed at [facility], [place] [address], [place], [place] [address]   Lipase, blood     Status: Abnormal   Collection Time: 06/18/21  8:28 AM  Result Value Ref Range   Lipase 75 (H) 11 - 51 U/L    Comment: Performed at [facility], [place] [address], [place], [place] [address]   DG Chest 2 View  Result Date: 06/18/2021 CLINICAL DATA:  66 year old female with history of  weakness and epigastric pain. COVID positive patient. EXAM: CHEST - 2 VIEW COMPARISON:  Chest x-ray 12/06/2016. FINDINGS: Lung volumes are normal. No consolidative airspace disease. No pleural effusions. No pneumothorax. No pulmonary nodule or mass noted. Pulmonary vasculature and the cardiomediastinal silhouette are within normal limits. IMPRESSION: No radiographic evidence of acute cardiopulmonary disease. Electronically Signed   By: Vinnie Langton M.D.   On: 06/18/2021 08:02   DG Abd 1 View  Result Date: 06/18/2021 CLINICAL DATA:  Weakness and abdominal pain EXAM: ABDOMEN - 1 VIEW COMPARISON:  05/26/2021 CT.  06/14/2019 plain film. FINDINGS: Cholecystectomy clips. Non-obstructive bowel gas pattern. No abnormal abdominal calcifications. No appendicolith. IMPRESSION: No acute findings. Electronically Signed   By: Abigail Miyamoto M.D.   On: 06/18/2021 12:07   US RENAL  Result Date: 06/18/2021 CLINICAL DATA:  Acute kidney injury EXAM: RENAL / URINARY TRACT ULTRASOUND COMPLETE COMPARISON:  CT abdomen and pelvis dated May 26, 2021 FINDINGS: Right Kidney: Renal measurements: 9.8 x 4.4 x 4.4 cm = volume: 99 mL. Echogenicity within normal limits. No mass or hydronephrosis visualized. Left Kidney: Renal measurements: 10.5 x 4.9 x 4.5 cm = volume: 122 mL. Echogenicity within normal limits. No mass or hydronephrosis visualized. Bladder: Appears normal for degree of bladder distention. Bilateral jets visualized. Other: None. IMPRESSION: No hydronephrosis. Electronically Signed   By: Yetta Glassman M.D.   On: 06/18/2021 12:02    Pending Labs Unresulted Labs (From admission, onward)     Start     Ordered   06/19/21 0500  Comprehensive metabolic panel  Tomorrow morning,   R  06/18/21 1206   06/19/21 0500  CBC  Tomorrow morning,   R        06/18/21 1206   06/18/21 0731  Urine Culture  Once,   STAT       Question:  Indication  Answer:  Sepsis   06/18/21 0730            Vitals/Pain Today's  Vitals   06/18/21 0500 06/18/21 0804 06/18/21 0815 06/18/21 1312  BP: (!) 101/46  (!) 105/52 112/61  Pulse: (!) 110  (!) 101 (!) 101  Resp: 17  18 18   Temp:  98.5 F (36.9 C)    TempSrc:  Rectal    SpO2: 97%  100% 99%  Weight:      Height:      PainSc:        Isolation Precautions No active isolations  Medications Medications  0.9 %  sodium chloride infusion ( Intravenous New Bag/Given 06/18/21 1246)  pantoprazole (PROTONIX) EC tablet 40 mg (40 mg Oral Given 06/18/21 1246)  apixaban (ELIQUIS) tablet 5 mg (5 mg Oral Given 06/18/21 1246)  albuterol (VENTOLIN HFA) 108 (90 Base) MCG/ACT inhaler 2 puff (has no administration in time range)  acetaminophen (TYLENOL) tablet 650 mg (has no administration in time range)    Or  acetaminophen (TYLENOL) suppository 650 mg (has no administration in time range)  HYDROcodone-acetaminophen (NORCO/VICODIN) 5-325 MG per tablet 1-2 tablet (has no administration in time range)  ondansetron (ZOFRAN) tablet 4 mg (has no administration in time range)    Or  ondansetron (ZOFRAN) injection 4 mg (has no administration in time range)  sodium chloride 0.9 % bolus 1,000 mL (0 mLs Intravenous Stopped 06/18/21 1030)  sodium chloride 0.9 % bolus 1,000 mL (0 mLs Intravenous Stopped 06/18/21 1341)    Mobility walks with device Low fall risk   Focused Assessments    R Recommendations: See Admitting Provider Note  Report given to:   Additional Notes:

## 2021-06-18 NOTE — ED Provider Notes (Signed)
[facility] DEPT Provider Note   CSN: [id number] Arrival date & time: 06/17/21  2116     History  Chief Complaint  Patient presents with   Weakness    Karen Ferguson is a 66 y.o. female hypertension, asthma, atrial fibrillation (on Eliquis), hyperthyroidism, recent COVID-19 infection, chronic abdominal discomfort.  Per chart review patient was admitted 1/2/ to 05/30/2021 for A. fib with RVR.    Patient presents to the emergency department with a chief complaint of generalized weakness and fatigue since being discharged from the hospital on 1/5.  Patient states that she has had decreased appetite and decreased p.o. intake over this time.  Patient also reports that she has had shortness of breath with exertion since being discharged from the hospital.  Denies any change in her shortness of breath over this time.  Denies any associated chest pain, pain on exertion, palpitations, lightheadedness, or syncope.  Patient does report that she has a productive cough.  States that mucus is "clear and thick."  Patient also endorses abdominal pain.  States that pain is to left upper quadrant and radiates to left flank.  States that this pain has been present for many years and is unchanged.  When asked patient reports that she has not started prescribed Acacian for hyperthyroidism.  Patient denies any fever, chills, dysuria, hematuria, urinary urgency, vaginal pain, vaginal bleeding, vaginal discharge, vomiting, constipation, diarrhea, blood in stool, melena, headache, visual disturbance, dysarthria, facial asymmetry, focal weakness.     Weakness Associated symptoms: abdominal pain, cough, nausea and shortness of breath   Associated symptoms: no chest pain, no diarrhea, no dizziness, no dysuria, no fever, no frequency, no headaches, no urgency and no vomiting       Home Medications Prior to Admission medications   Medication Sig Start Date End Date Taking? Authorizing  Provider  acetaminophen (TYLENOL) 500 MG tablet Take 500 mg by mouth every 6 (six) hours as needed for mild pain.    [provider]  albuterol (VENTOLIN HFA) 108 (90 Base) MCG/ACT inhaler INHALE 2 PUFFS INTO THE LUNGS FOUR TIMES DAILY AS NEEDED FOR SHORTNESS OF BREATH. Patient taking differently: Inhale 2 puffs into the lungs every 6 (six) hours as needed for wheezing or shortness of breath. 06/21/20   Tisovec, Fransico Him, MD  albuterol (VENTOLIN HFA) 108 (90 Base) MCG/ACT inhaler INHALE 2 PUFFS BY MOUTH 4 TIMES A DAY AS NEEDED FOR SHORTNESS OF BREATH 09/05/19 09/04/20  Tisovec, Fransico Him, MD  apixaban (ELIQUIS) 5 MG TABS tablet Take 1 tablet (5 mg total) by mouth 2 (two) times daily. 05/30/21   Florencia Reasons, MD  Cholecalciferol (VITAMIN D3) 1.25 MG (50000 UT) CAPS TAKE 1 CAPSULE BY MOUTH ONCE A WEEK. 06/21/20 06/21/21  Tisovec, Fransico Him, MD  diphenhydrAMINE (BENADRYL) 25 mg capsule Take 25 mg by mouth every 6 (six) hours as needed for itching.    [provider]  linaclotide (LINZESS) 290 MCG CAPS capsule TAKE 1 CAPSULE BY MOUTH AT LEAST 30 MINUTES BEFORE THE FIRST MEAL OF THE DAY ON AN EMPTY STOMACH Patient taking differently: Take 290 mcg by mouth daily before breakfast. 04/13/20   Ronnette Juniper, MD  methimazole (TAPAZOLE) 10 MG tablet Take 3 tablets (30 mg total) by mouth 2 (two) times daily. 06/05/21   Renato Shin, MD  methocarbamol (ROBAXIN) 500 MG tablet Take 1 tablet (500 mg total) by mouth 2 (two) times daily. Patient taking differently: Take 500 mg by mouth 3 (three) times daily as  needed for muscle spasms. 12/07/16   Junius Creamer, NP  metoprolol tartrate (LOPRESSOR) 25 MG tablet Take 1 tablet (25 mg total) by mouth 2 (two) times daily. 05/30/21   Florencia Reasons, MD  omeprazole (PRILOSEC) 20 MG capsule Take 1 capsule (20 mg total) by mouth every morning 30 minutes before a meal. 05/10/21     pantoprazole (PROTONIX) 40 MG tablet Take 1 tablet (40 mg total) by mouth daily. 05/30/21   Florencia Reasons, MD   valsartan-hydrochlorothiazide (DIOVAN-HCT) 80-12.5 MG tablet TAKE 1 TABLET BY MOUTH DAILY 08/13/20 08/13/21  Tisovec, Fransico Him, MD      Allergies    Shellfish allergy    Review of Systems   Review of Systems  Constitutional:  Positive for appetite change and fatigue. Negative for chills and fever.  HENT:  Positive for rhinorrhea and sneezing. Negative for congestion and sore throat.   Eyes:  Negative for visual disturbance.  Respiratory:  Positive for cough and shortness of breath.   Cardiovascular:  Negative for chest pain, palpitations and leg swelling.  Gastrointestinal:  Positive for abdominal pain and nausea. Negative for abdominal distention, anal bleeding, blood in stool, constipation, diarrhea, rectal pain and vomiting.  Genitourinary:  Negative for difficulty urinating, dysuria, frequency, genital sores, hematuria, pelvic pain, urgency, vaginal bleeding, vaginal discharge and vaginal pain.  Musculoskeletal:  Negative for back pain and neck pain.  Skin:  Negative for color change and rash.  Neurological:  Positive for weakness. Negative for dizziness, syncope, light-headedness and headaches.  Psychiatric/Behavioral:  Negative for confusion.    Physical Exam Updated Vital Signs BP (!) 101/46 (BP Location: Right Arm)    Pulse (!) 110    Temp 98.2 F (36.8 C)    Resp 17    Ht 5\' 5"  (1.651 m)    Wt 85 kg    SpO2 97%    BMI 31.18 kg/m  Physical Exam Vitals and nursing note reviewed.  Constitutional:      General: She is not in acute distress.    Appearance: She is not ill-appearing, toxic-appearing or diaphoretic.  HENT:     Head: Normocephalic.  Eyes:     General: No scleral icterus.       Right eye: No discharge.        Left eye: No discharge.  Cardiovascular:     Rate and Rhythm: Normal rate.     Pulses:          Radial pulses are 2+ on the right side and 2+ on the left side.  Pulmonary:     Effort: Pulmonary effort is normal. No tachypnea, bradypnea or respiratory  distress.     Breath sounds: Normal breath sounds. No stridor.     Comments: Speaks in full complete sentences without difficulty Abdominal:     General: Abdomen is flat. There is no distension. There are no signs of injury.     Palpations: Abdomen is soft. There is no mass or pulsatile mass.     Tenderness: There is abdominal tenderness in the left upper quadrant. There is no right CVA tenderness, left CVA tenderness, guarding or rebound.     Hernia: There is no hernia in the umbilical area or ventral area.     Comments: Minimal tenderness to left upper quadrant  Musculoskeletal:     Right lower leg: Normal. No swelling or tenderness. No edema.     Left lower leg: Normal. No swelling or tenderness. No edema.  Skin:    General: Skin  is warm and dry.  Neurological:     General: No focal deficit present.     Mental Status: She is alert and oriented to person, place, and time.     GCS: GCS eye subscore is 4. GCS verbal subscore is 5. GCS motor subscore is 6.     Comments: No facial asymmetry or dysarthria.  Patient moves all limbs equally without difficulty  Psychiatric:        Behavior: Behavior is cooperative.    ED Results / Procedures / Treatments   Labs (all labs ordered are listed, but only abnormal results are displayed) Labs Reviewed  CBC WITH DIFFERENTIAL/PLATELET - Abnormal; Notable for the following components:      Result Value   RBC 3.47 (*)    Hemoglobin 10.5 (*)    HCT 31.9 (*)    Platelets 135 (*)    All other components within normal limits  COMPREHENSIVE METABOLIC PANEL - Abnormal; Notable for the following components:   CO2 20 (*)    Glucose, Bld 102 (*)    BUN 87 (*)    Creatinine, Ser 1.58 (*)    Albumin 3.3 (*)    GFR, Estimated 36 (*)    All other components within normal limits  URINALYSIS, ROUTINE W REFLEX MICROSCOPIC - Abnormal; Notable for the following components:   APPearance HAZY (*)    All other components within normal limits  URINE CULTURE   LACTIC ACID, PLASMA  MAGNESIUM  TSH  TROPONIN I (HIGH SENSITIVITY)  TROPONIN I (HIGH SENSITIVITY)    EKG EKG Interpretation  Date/Time:  Tuesday June 18 2021 00:32:46 EST Ventricular Rate:  92 PR Interval:  158 QRS Duration: 88 QT Interval:  340 QTC Calculation: 421 R Axis:   27 Text Interpretation: Sinus rhythm Low voltage, precordial leads Confirmed by Ripley Fraise [phone]) on 06/18/2021 1:33:19 AM  Radiology DG Chest 2 View  Result Date: 06/18/2021 CLINICAL DATA:  67 year old female with history of weakness and epigastric pain. COVID positive patient. EXAM: CHEST - 2 VIEW COMPARISON:  Chest x-ray 12/06/2016. FINDINGS: Lung volumes are normal. No consolidative airspace disease. No pleural effusions. No pneumothorax. No pulmonary nodule or mass noted. Pulmonary vasculature and the cardiomediastinal silhouette are within normal limits. IMPRESSION: No radiographic evidence of acute cardiopulmonary disease. Electronically Signed   By: Vinnie Langton M.D.   On: 06/18/2021 08:02    Procedures Procedures    Medications Ordered in ED Medications  sodium chloride 0.9 % bolus 1,000 mL (has no administration in time range)  sodium chloride 0.9 % bolus 1,000 mL (1,000 mLs Intravenous New Bag/Given 06/18/21 1914)    ED Course/ Medical Decision Making/ A&P Clinical Course as of 06/18/21 1727  Tue Jun 18, 2021  0937 Spoke to Dr. Marylyn Ishihara who will see the patient for admission. [PB]    Clinical Course User Index [PB] Loni Beckwith, PA-C                           Medical Decision Making Amount and/or Complexity of Data Reviewed Labs: ordered. Radiology: ordered.  Risk Decision regarding hospitalization.   This patient presents to the ED for concern of fatigue, generalized weakness, cough, and shortness of breath, this involves an extensive number of treatment options, and is a complaint that carries with it a high risk of complications and morbidity.     Co  morbidities that complicate the patient evaluation  Hypertension, A. fib   Additional history  obtained:  External records from outside source obtained and reviewed including previous provider notes, lab work, and imaging Last echocardiogram performed January 2018 which showed EF within normal limits.   Lab Tests:  I Ordered, and personally interpreted labs.  The pertinent results include:   BUN 87, creatinine 1.58.  Creatinine doubled from previous lab results obtained on 1/5 Troponin within normal limits Slight anemia which appears baseline for patient Lactic acid within normal limits Troponin 9 and 10 with delta of +1 UA shows no signs of infection   Imaging Studies ordered:  I ordered imaging studies including chest x-ray I independently visualized and interpreted imaging which showed no acute cardiopulmonary disease I agree with the radiologist interpretation   Cardiac Monitoring:  The patient was maintained on a cardiac monitor.  I personally viewed and interpreted the cardiac monitored which showed an underlying rhythm of: Sinus rhythm   Medicines ordered and prescription drug management:  I ordered medication including fluid bolus for soft blood pressure Reevaluation of the patient after these medicines showed that the patient improved I have reviewed the patients home medicines and have made adjustments as needed   Problem List / ED Course:  Fatigue/generalized weakness Patient reports the symptoms have been present over the last 2 weeks after being discharged from the hospital.  Patient reports decreased appetite and decreased p.o. intake over this time. Blood pressure soft and patient had episode of tachycardia concern for possible sepsis. Low suspicion for sepsis at this time as no leukocytosis, afebrile, lactic acid within normal limits, no signs of infection on chest x-ray or urinalysis. Shortness of breath Patient reports exertional shortness of breath  over the last 2 weeks.  No associated chest pain. Negative delta troponin, EKG shows sinus rhythm, chest x-ray shows no active cardiopulmonary disease. Patient did test positive for COVID-19 2 weeks prior.  Patient's shortness of breath may be related to her recent viral illness. AKI Patient noted to have creatinine doubled from results obtained on 1/5.  Suspect this is due to dehydration as patient reports decreased p.o. intake and is hypotensive.  Patient has received fluid bolus with normal saline.  Will consult hospitalist for admission.   Reevaluation:  After the interventions noted above, I reevaluated the patient and found that they have :improved   Disposition:  After consideration of the diagnostic results and the patients response to treatment, I feel that the patent would benefit from admission.          Final Clinical Impression(s) / ED Diagnoses Final diagnoses:  AKI (acute kidney injury) ([place])  Generalized weakness  Acute cough    Rx / DC Orders ED Discharge Orders     None         Dyann Ruddle 06/18/21 1729    Milton Ferguson, MD 06/19/21 [phone]

## 2021-06-18 NOTE — ED Provider Triage Note (Signed)
Emergency Medicine Provider Triage Evaluation Note  Karen Ferguson , a 66 y.o. female  was evaluated in triage.  Pt complains of weakness. States that same has been ongoing since she was discharged from the hospital. She was admitted for new onset afib at that time. States that she also tested positive for covid at that time. She states that she has epigastric abdominal pain as well that has not resolved from when she was admitted before. She states that the hospitalist team was unable to determine the cause of this pain. She states she has not eaten anything since she was discharged because it makes her pain worse. She denies nausea, vomiting, or diarrhea. No fevers or chills  Review of Systems  Positive:  Negative: See above  Physical Exam  BP (!) 106/58 (BP Location: Right Arm)    Pulse 96    Temp 98.2 F (36.8 C)    Resp 17    Ht 5\' 5"  (1.651 m)    Wt 85 kg    SpO2 100%    BMI 31.18 kg/m  Gen:   Awake, no distress   Resp:  Normal effort  MSK:   Moves extremities without difficulty  Other:    Medical Decision Making  Medically screening exam initiated at 12:06 AM.  Appropriate orders placed.  Karen Ferguson was informed that the remainder of the evaluation will be completed by another provider, this initial triage assessment does not replace that evaluation, and the importance of remaining in the ED until their evaluation is complete.     Bud Face, PA-C 06/18/21 0011

## 2021-06-18 NOTE — Progress Notes (Signed)
Pt arrived to unit from ED in w/c. Independently ambulatory to bed. VSS as charted. Pt oriented to callbell and environment. POC discussed. No c/o abd pain or nausea at present.

## 2021-06-18 NOTE — H&P (Addendum)
History and Physical    TENNILLE Ferguson EHM:[id number] DOB: 10-26-1955 DOA: 06/17/2021  PCP: Haywood Pao, MD  Patient coming from: Home  Chief Complaint: "I feel sick."  HPI: Karen Ferguson is a 66 y.o. female with medical history significant of HTN, asthma, chronic abdominal pain, GERD, hyperthyroidism, a fib on eliquis. Presenting with abdominal pain. Recently in the hospital for abdominal pain and a fib. She was discharged to home on 05/30/21. She reports the next day after getting home, her abdominal pain returned. She has had poor appetite. Her abdominal pain is vague. She reports that as the days progressed, she just feel weaker. She finally decided to come back to the ED for help.   ED Course: She was noted to have an AKI. She ws given fluids. [facility] was called for admission.   Review of Systems:  Denies CP, palpitations, N/V/D, fever, sick contacts. Review of systems is otherwise negative for all not mentioned in HPI.   PMHx Past Medical History:  Diagnosis Date   Arthritis    Asthma    Albuterol prn   Bronchitis    Degenerative disc disease    Depression    Diabetes mellitus without complication ([place])    Borderline diabetic in the past per pt   History of echocardiogram    Echo 1/18: EF 60-65, no RWMA, Gr 1 DD, PASP 20   Hypertension    PVC's (premature ventricular contractions)    Holter 12/17: NSR, occ PAC/PVCs, no AFib; one 4 beat run NSVT    PSHx Past Surgical History:  Procedure Laterality Date   ABDOMINAL HYSTERECTOMY  1988   for fibroid tumors   CARPAL TUNNEL RELEASE  1985   rt   [facility] ARTHROSCOPY Right    TENDON REPAIR Right 03/03/2013   Procedure: RIGHT DEBRIDEMENT AND TENOLYSIS OF PERONEOUS LONGOUS AND BREVIS TENDONS ;  Surgeon: Wylene Simmer, MD;  Location: [facility];  Service: Orthopedics;  Laterality: Right;   TOOTH EXTRACTION N/A 03/25/2019   Procedure: DENTAL EXTRACTIONS TEETH NUMBER  THREE, FOUR, FIVE, SIX, SEVEN, NINE, TWELVE, FOURTEEN, THIRTY AND ALVEOLOPLASTY;  Surgeon: Diona Browner, DDS;  Location: [facility];  Service: Oral Surgery;  Laterality: N/A;    SocHx  reports that she has never smoked. She has never used smokeless tobacco. She reports current alcohol use. She reports that she does not use drugs.  Allergies  Allergen Reactions   Shellfish Allergy Hives    FamHx Family History  Problem Relation Age of Onset   Thyroid disease Mother    Arthritis Mother    Hypertension Mother    Diabetes Mother    Kidney disease Mother    Arrhythmia Mother        AFib   Thyroid disease Sister    Arthritis Sister    Heart disease Sister    Hypertension Brother    Heart disease Maternal Grandfather    Heart attack Maternal Grandfather 24       MI   Heart disease Maternal Aunt    Heart failure Maternal Aunt    Heart disease Maternal Uncle    Cancer Maternal Uncle     Prior to Admission medications   Medication Sig Start Date End Date Taking? Authorizing Provider  acetaminophen (TYLENOL) 500 MG tablet Take 500 mg by mouth every 6 (six) hours as needed for mild pain.    [provider]  albuterol (VENTOLIN HFA) 108 (  90 Base) MCG/ACT inhaler INHALE 2 PUFFS INTO THE LUNGS FOUR TIMES DAILY AS NEEDED FOR SHORTNESS OF BREATH. Patient taking differently: Inhale 2 puffs into the lungs every 6 (six) hours as needed for wheezing or shortness of breath. 06/21/20   Tisovec, Fransico Him, MD  albuterol (VENTOLIN HFA) 108 (90 Base) MCG/ACT inhaler INHALE 2 PUFFS BY MOUTH 4 TIMES A DAY AS NEEDED FOR SHORTNESS OF BREATH 09/05/19 09/04/20  Tisovec, Fransico Him, MD  apixaban (ELIQUIS) 5 MG TABS tablet Take 1 tablet (5 mg total) by mouth 2 (two) times daily. 05/30/21   Florencia Reasons, MD  Cholecalciferol (VITAMIN D3) 1.25 MG (50000 UT) CAPS TAKE 1 CAPSULE BY MOUTH ONCE A WEEK. 06/21/20 06/21/21  Tisovec, Fransico Him, MD  diphenhydrAMINE (BENADRYL) 25 mg capsule Take 25 mg by mouth every 6 (six)  hours as needed for itching.    [provider]  linaclotide (LINZESS) 290 MCG CAPS capsule TAKE 1 CAPSULE BY MOUTH AT LEAST 30 MINUTES BEFORE THE FIRST MEAL OF THE DAY ON AN EMPTY STOMACH Patient taking differently: Take 290 mcg by mouth daily before breakfast. 04/13/20   Ronnette Juniper, MD  methimazole (TAPAZOLE) 10 MG tablet Take 3 tablets (30 mg total) by mouth 2 (two) times daily. 06/05/21   Renato Shin, MD  methocarbamol (ROBAXIN) 500 MG tablet Take 1 tablet (500 mg total) by mouth 2 (two) times daily. Patient taking differently: Take 500 mg by mouth 3 (three) times daily as needed for muscle spasms. 12/07/16   Junius Creamer, NP  metoprolol tartrate (LOPRESSOR) 25 MG tablet Take 1 tablet (25 mg total) by mouth 2 (two) times daily. 05/30/21   Florencia Reasons, MD  omeprazole (PRILOSEC) 20 MG capsule Take 1 capsule (20 mg total) by mouth every morning 30 minutes before a meal. 05/10/21     pantoprazole (PROTONIX) 40 MG tablet Take 1 tablet (40 mg total) by mouth daily. 05/30/21   Florencia Reasons, MD  valsartan-hydrochlorothiazide (DIOVAN-HCT) 80-12.5 MG tablet TAKE 1 TABLET BY MOUTH DAILY 08/13/20 08/13/21  Tisovec, Fransico Him, MD    Physical Exam: Vitals:   06/17/21 2345 06/18/21 0500 06/18/21 0804 06/18/21 0815  BP: (!) 106/58 (!) 101/46  (!) 105/52  Pulse: 96 (!) 110  (!) 101  Resp: 17 17  18   Temp:   98.5 F (36.9 C)   TempSrc:   Rectal   SpO2: 100% 97%  100%  Weight:      Height:        General: 66 y.o. female resting in bed in NAD Eyes: PERRL, normal sclera ENMT: Nares patent w/o discharge, orophaynx clear, dentition normal, ears w/o discharge/lesions/ulcers Neck: Supple, trachea midline Cardiovascular: RRR, +S1, S2, no m/g/r, equal pulses throughout Respiratory: CTABL, no w/r/r, normal WOB GI: BS+, ND, TTP RUQ/LUQ mild, no masses noted, no organomegaly noted MSK: No e/c/c Neuro: A&O x 3, no focal deficits Psyc: Appropriate interaction but flat affect, calm/cooperative  Labs on  Admission: I have personally reviewed following labs and imaging studies  CBC: Recent Labs  Lab 06/18/21 0037  WBC 7.5  NEUTROABS 4.3  HGB 10.5*  HCT 31.9*  MCV 91.9  PLT 678*   Basic Metabolic Panel: Recent Labs  Lab 06/18/21 0037 06/18/21 0823  NA 135  --   K 3.9  --   CL 104  --   CO2 20*  --   GLUCOSE 102*  --   BUN 87*  --   CREATININE 1.58*  --   CALCIUM 10.1  --  MG  --  1.8   GFR: Estimated Creatinine Clearance: 37.2 mL/min (A) (by C-G formula based on SCr of 1.58 mg/dL (H)). Liver Function Tests: Recent Labs  Lab 06/18/21 0037  AST 23  ALT 24  ALKPHOS 72  BILITOT 0.8  PROT 7.3  ALBUMIN 3.3*   No results for input(s): LIPASE, AMYLASE in the last 168 hours. No results for input(s): AMMONIA in the last 168 hours. Coagulation Profile: No results for input(s): INR, PROTIME in the last 168 hours. Cardiac Enzymes: No results for input(s): CKTOTAL, CKMB, CKMBINDEX, TROPONINI in the last 168 hours. BNP (last 3 results) No results for input(s): PROBNP in the last 8760 hours. HbA1C: No results for input(s): HGBA1C in the last 72 hours. CBG: No results for input(s): GLUCAP in the last 168 hours. Lipid Profile: No results for input(s): CHOL, HDL, LDLCALC, TRIG, CHOLHDL, LDLDIRECT in the last 72 hours. Thyroid Function Tests: Recent Labs    06/18/21 0823  TSH <0.010*   Anemia Panel: No results for input(s): VITAMINB12, FOLATE, FERRITIN, TIBC, IRON, RETICCTPCT in the last 72 hours. Urine analysis:    Component Value Date/Time   COLORURINE YELLOW 06/18/2021 0823   APPEARANCEUR HAZY (A) 06/18/2021 0823   LABSPEC 1.013 06/18/2021 0823   PHURINE 5.0 06/18/2021 0823   GLUCOSEU NEGATIVE 06/18/2021 0823   HGBUR NEGATIVE 06/18/2021 0823   BILIRUBINUR NEGATIVE 06/18/2021 0823   KETONESUR NEGATIVE 06/18/2021 0823   PROTEINUR NEGATIVE 06/18/2021 0823   UROBILINOGEN 1.0 09/24/2011 2046   NITRITE NEGATIVE 06/18/2021 0823   LEUKOCYTESUR NEGATIVE 06/18/2021  0823    Radiological Exams on Admission: DG Chest 2 View  Result Date: 06/18/2021 CLINICAL DATA:  66 year old female with history of weakness and epigastric pain. COVID positive patient. EXAM: CHEST - 2 VIEW COMPARISON:  Chest x-ray 12/06/2016. FINDINGS: Lung volumes are normal. No consolidative airspace disease. No pleural effusions. No pneumothorax. No pulmonary nodule or mass noted. Pulmonary vasculature and the cardiomediastinal silhouette are within normal limits. IMPRESSION: No radiographic evidence of acute cardiopulmonary disease. Electronically Signed   By: Vinnie Langton M.D.   On: 06/18/2021 08:02    EKG: Independently reviewed. Sinus, no st elevations  Assessment/Plan AKI     - place in obs, tele     - check renal US     - fluids  Abdominal pain     - check lipase, KUB     - vague abdominal pain; may need to try bentyl  A fib on eliquis     - continue eliquis; hold metoprolol d/t hypotension  HTN     - hold metoprolol d/t hypotension, hold ACEi/diuretic d/t AKI   Recent COVID 19 infection     - completed tx with lagevrio; no symptoms; no need for isolation  Hyperthyroidism     - continue outpt endo follow up; she is not taking tapazole  GERD     - protonix  DVT prophylaxis: eliquis  Code Status: FULL  Family Communication: None at bedside  Consults called: None   Status is: Observation  The patient remains OBS appropriate and will d/c before 2 midnights.  Jonnie Finner DO [facility]  If 7PM-7AM, please contact night-coverage [email or web address]  06/18/2021, 9:38 AM

## 2021-06-19 ENCOUNTER — Other Ambulatory Visit (HOSPITAL_COMMUNITY): Payer: Self-pay

## 2021-06-19 DIAGNOSIS — I480 Paroxysmal atrial fibrillation: Secondary | ICD-10-CM | POA: Insufficient documentation

## 2021-06-19 DIAGNOSIS — I4891 Unspecified atrial fibrillation: Secondary | ICD-10-CM

## 2021-06-19 DIAGNOSIS — E0590 Thyrotoxicosis, unspecified without thyrotoxic crisis or storm: Secondary | ICD-10-CM | POA: Diagnosis present

## 2021-06-19 DIAGNOSIS — R112 Nausea with vomiting, unspecified: Secondary | ICD-10-CM | POA: Diagnosis present

## 2021-06-19 DIAGNOSIS — E041 Nontoxic single thyroid nodule: Secondary | ICD-10-CM

## 2021-06-19 LAB — URINE CULTURE: Culture: NO GROWTH

## 2021-06-19 LAB — CBC
HCT: 28.7 % — ABNORMAL LOW (ref 36.0–46.0)
Hemoglobin: 9.1 g/dL — ABNORMAL LOW (ref 12.0–15.0)
MCH: 30.4 pg (ref 26.0–34.0)
MCHC: 31.7 g/dL (ref 30.0–36.0)
MCV: 96 fL (ref 80.0–100.0)
Platelets: 115 10*3/uL — ABNORMAL LOW (ref 150–400)
RBC: 2.99 MIL/uL — ABNORMAL LOW (ref 3.87–5.11)
RDW: 12.1 % (ref 11.5–15.5)
WBC: 5.4 10*3/uL (ref 4.0–10.5)
nRBC: 0 % (ref 0.0–0.2)

## 2021-06-19 LAB — COMPREHENSIVE METABOLIC PANEL
ALT: 22 U/L (ref 0–44)
AST: 24 U/L (ref 15–41)
Albumin: 2.8 g/dL — ABNORMAL LOW (ref 3.5–5.0)
Alkaline Phosphatase: 61 U/L (ref 38–126)
Anion gap: 8 (ref 5–15)
BUN: 67 mg/dL — ABNORMAL HIGH (ref 8–23)
CO2: 20 mmol/L — ABNORMAL LOW (ref 22–32)
Calcium: 10.2 mg/dL (ref 8.9–10.3)
Chloride: 114 mmol/L — ABNORMAL HIGH (ref 98–111)
Creatinine, Ser: 1.13 mg/dL — ABNORMAL HIGH (ref 0.44–1.00)
GFR, Estimated: 53 mL/min — ABNORMAL LOW (ref >60)
Glucose, Bld: 81 mg/dL (ref 70–99)
Potassium: 4.2 mmol/L (ref 3.5–5.1)
Sodium: 142 mmol/L (ref 135–145)
Total Bilirubin: 1 mg/dL (ref 0.3–1.2)
Total Protein: 6.3 g/dL — ABNORMAL LOW (ref 6.5–8.1)

## 2021-06-19 LAB — BASIC METABOLIC PANEL
Anion gap: 5 (ref 5–15)
BUN: 52 mg/dL — ABNORMAL HIGH (ref 8–23)
CO2: 24 mmol/L (ref 22–32)
Calcium: 10.1 mg/dL (ref 8.9–10.3)
Chloride: 111 mmol/L (ref 98–111)
Creatinine, Ser: 0.93 mg/dL (ref 0.44–1.00)
GFR, Estimated: >60 mL/min (ref >60)
Glucose, Bld: 115 mg/dL — ABNORMAL HIGH (ref 70–99)
Potassium: 4.3 mmol/L (ref 3.5–5.1)
Sodium: 140 mmol/L (ref 135–145)

## 2021-06-19 MED ORDER — SENNOSIDES-DOCUSATE SODIUM 8.6-50 MG PO TABS
1.0000 | ORAL_TABLET | Freq: Every day | ORAL | 1 refills | Status: DC | PRN
  Filled 2021-06-19: qty 30, 30d supply, fill #0

## 2021-06-19 NOTE — Hospital Course (Addendum)
66 year old female with past medical history of hypertension, hyperthyroidism, atrial fibrillation on Eliquis, obesity and chronic abdominal pain presented to the emergency room on the night of 1/23 with complaints of abdominal pain as well as poor p.o. intake.  Patient found to have acute kidney injury felt to be secondary to poor p.o. intake.  By following day, feeling much better.  Tolerating p.o.  Acute kidney injury resolved with IV fluids.

## 2021-06-19 NOTE — Assessment & Plan Note (Signed)
Secondary to nausea and vomiting.  Resolved with IV fluids.

## 2021-06-19 NOTE — Assessment & Plan Note (Signed)
Much improved.  Tolerating p.o.  May be secondary to hyperthyroidism.

## 2021-06-19 NOTE — Assessment & Plan Note (Signed)
Patient has been wary about taking her Tapazole medication, fearful of long-term side effects.  We had extensive discussion about this.  I explained to her that this may be the cause of many of her issues like atrial fibrillation and even nausea/vomiting/abdominal pain.  She says that she will think about.  TSH checked and found to be quite suppressed.

## 2021-06-19 NOTE — Assessment & Plan Note (Signed)
Meets criteria BMI greater than 30 

## 2021-06-19 NOTE — TOC Initial Note (Signed)
Transition of Care [facility]) - Initial/Assessment Note    Patient Details  Name: Karen Ferguson MRN: [id number] Date of Birth: 1955/05/31  Transition of Care [facility]) CM/SW Contact:    Leeroy Cha, RN Phone Number: 06/19/2021, 7:44 AM  Clinical Narrative:                  Transition of Care [facility]) Screening Note   Patient Details  Name: Karen Ferguson Date of Birth: 06-14-1955   Transition of Care [facility]) CM/SW Contact:    Leeroy Cha, RN Phone Number: 06/19/2021, 7:44 AM    Transition of Care Department (TOC) has reviewed patient and no TOC needs have been identified at this time. We will continue to monitor patient advancement through interdisciplinary progression rounds. If new patient transition needs arise, please place a TOC consult.    Expected Discharge Plan: Home/Self Care Barriers to Discharge: Continued Medical Work up   Patient Goals and CMS Choice Patient states their goals for this hospitalization and ongoing recovery are:: to go home and be well CMS Medicare.gov Compare Post Acute Care list provided to:: Patient    Expected Discharge Plan and Services Expected Discharge Plan: Home/Self Care   Discharge Planning Services: CM Consult   Living arrangements for the past 2 months: Single Family Home                                      Prior Living Arrangements/Services Living arrangements for the past 2 months: Single Family Home Lives with:: Self Patient language and need for interpreter reviewed:: Yes Do you feel safe going back to the place where you live?: Yes            Criminal Activity/Legal Involvement Pertinent to Current Situation/Hospitalization: No - Comment as needed  Activities of Daily Living Home Assistive Devices/Equipment: CPAP ADL Screening (condition at time of admission) Patient's cognitive ability adequate to safely complete daily activities?: Yes Is the patient deaf or have difficulty hearing?: No Does the  patient have difficulty seeing, even when wearing glasses/contacts?: No Does the patient have difficulty concentrating, remembering, or making decisions?: No Patient able to express need for assistance with ADLs?: Yes Does the patient have difficulty dressing or bathing?: No Independently performs ADLs?: Yes (appropriate for developmental age) Does the patient have difficulty walking or climbing stairs?: No Weakness of Legs: None Weakness of Arms/Hands: None  Permission Sought/Granted                  Emotional Assessment Appearance:: Appears stated age     Orientation: : Oriented to Self, Oriented to Place, Oriented to  Time, Oriented to Situation Alcohol / Substance Use: Not Applicable Psych Involvement: No (comment)  Admission diagnosis:  Generalized weakness [R53.1] AKI (acute kidney injury) ([place]) [N17.9] Abdominal pain [R10.9] Acute cough [R05.1] Patient Active Problem List   Diagnosis Date Noted   AKI (acute kidney injury) ([place]) 05/27/2021   Pancreatitis 05/27/2021   COVID-19 virus detected 05/27/2021   Atrial fibrillation with RVR ([place]) 05/27/2021   Abnormal TSH 05/27/2021   Abdominal pain    Atrial fibrillation with rapid ventricular response ([place]) 05/26/2021   Palpitations 04/01/2016   Family history of premature coronary heart disease 04/01/2016   Obstructive sleep apnea 12/28/2015   Bilateral leg numbness 07/03/2015   GERD (gastroesophageal reflux disease) 07/03/2015   Constipation 07/03/2015   Dizziness and giddiness 01/10/2015   Foot  pain 01/10/2013   Tendon tear, ankle 01/10/2013   Cyst and pseudocyst of pancreas 03/16/2012   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 [place] [address] Phone: [phone] Fax: [phone]     Social Determinants of Health (SDOH) Interventions    Readmission Risk Interventions No flowsheet data found.

## 2021-06-19 NOTE — Discharge Instructions (Signed)
Need to restart your tapazol medication

## 2021-06-19 NOTE — Discharge Summary (Addendum)
Physician Discharge Summary   Patient: Karen Ferguson MRN: [id number] DOB: 08/07/1955  Admit date:     06/17/2021  Discharge date: 06/19/21  Discharge Physician: Annita Brod   PCP: Haywood Pao, MD   Recommendations at discharge:   Patient patient has requested information for a new primary care provider.  Have given her the information for Dr. Rica Records. Patient advised to restart her Tapazole. Interventional radiology will follow up with patient for ultrasound-guided thyroid nodule  Discharge Diagnoses Principal Problem:   AKI (acute kidney injury) ([place]) Active Problems:   Chronic abdominal pain   Hyperthyroidism   Essential hypertension   Atrial fibrillation with rapid ventricular response ([facility])   Obesity   Nausea & vomiting  Resolved Problems:   * No resolved hospital problems. [facility] Course   67 year old female with past medical history of hypertension, hyperthyroidism, atrial fibrillation on Eliquis, obesity and chronic abdominal pain presented to the emergency room on the night of 1/23 with complaints of abdominal pain as well as poor p.o. intake.  Patient found to have acute kidney injury felt to be secondary to poor p.o. intake.  By following day, feeling much better.  Tolerating p.o.  Acute kidney injury resolved with IV fluids.  * AKI (acute kidney injury) ([place])- (present on admission) Secondary to nausea and vomiting.  Resolved with IV fluids.  Chronic abdominal pain- (present on admission) Much improved.  Tolerating p.o.  May be secondary to hyperthyroidism.  Hyperthyroidism- (present on admission) Patient has been wary about taking her Tapazole medication, fearful of long-term side effects.  We had extensive discussion about this.  I explained to her that this may be the cause of many of her issues like atrial fibrillation and even nausea/vomiting/abdominal pain.  She says that she will think about.  TSH checked and found to be quite  suppressed.  Essential hypertension- (present on admission) Restarting home medications upon discharge.  Atrial fibrillation with rapid ventricular response ([place])- (present on admission) Rate controlled.  Resume metoprolol discharge.  Continued on Eliquis.  Underlying cause may be poor control of hyperthyroidism.  Obesity- (present on admission) Meets criteria BMI greater than 30  Nausea & vomiting- (present on admission) May be secondary to hyperthyroidism versus gastroenteritis.  Acute issue resolved.  Patient tolerating p.o.  Thyroid nodule: Patient went thyroid ultrasound and found to have 2 nodules, 1 in the middle right lobe which was recommended for monitoring and reimaging in the next 5 years.  The other nodule is located in the isthmus and large enough with recommendation for fine-needle aspiration.   Consultants: None Procedures performed: None Disposition: Home Diet recommendation: Cardiac diet  DISCHARGE MEDICATION: Allergies as of 06/19/2021       Reactions   Shellfish Allergy Hives        Medication List     TAKE these medications    acetaminophen 500 MG tablet Commonly known as: TYLENOL Take 500 mg by mouth every 6 (six) hours as needed for mild pain.   albuterol 108 (90 Base) MCG/ACT inhaler Commonly known as: VENTOLIN HFA INHALE 2 PUFFS INTO THE LUNGS FOUR TIMES DAILY AS NEEDED FOR SHORTNESS OF BREATH. What changed:  how much to take how to take this when to take this reasons to take this   diphenhydrAMINE 25 mg capsule Commonly known as: BENADRYL Take 25 mg by mouth every 6 (six) hours as needed for itching.   Eliquis 5 MG Tabs tablet Generic drug: apixaban Take 1 tablet (5  mg total) by mouth 2 (two) times daily.   Linzess 290 MCG Caps capsule Generic drug: linaclotide TAKE 1 CAPSULE BY MOUTH AT LEAST 30 MINUTES BEFORE THE FIRST MEAL OF THE DAY ON AN EMPTY STOMACH What changed: when to take this   methimazole 10 MG tablet Commonly known  as: TAPAZOLE Take 3 tablets (30 mg total) by mouth 2 (two) times daily.   metoprolol tartrate 25 MG tablet Commonly known as: LOPRESSOR Take 1 tablet (25 mg total) by mouth 2 (two) times daily.   pantoprazole 40 MG tablet Commonly known as: PROTONIX Take 1 tablet (40 mg total) by mouth daily. What changed: when to take this   senna-docusate 8.6-50 MG tablet Commonly known as: Senokot-S Take 1 tablet by mouth daily as needed for mild constipation.   valsartan-hydrochlorothiazide 80-12.5 MG tablet Commonly known as: DIOVAN-HCT TAKE 1 TABLET BY MOUTH DAILY What changed: when to take this           Discharge Exam: Filed Weights   06/17/21 2128  Weight: 85 kg   General: Alert and oriented x3, no acute distress Cardiovascular: Regular rate and rhythm, S1-S2 Lungs: Clear to auscultation bilaterally  Condition at discharge: good  The results of significant diagnostics from this hospitalization (including imaging, microbiology, ancillary and laboratory) are listed below for reference.   Imaging Studies: DG Chest 2 View  Result Date: 06/18/2021 CLINICAL DATA:  66 year old female with history of weakness and epigastric pain. COVID positive patient. EXAM: CHEST - 2 VIEW COMPARISON:  Chest x-ray 12/06/2016. FINDINGS: Lung volumes are normal. No consolidative airspace disease. No pleural effusions. No pneumothorax. No pulmonary nodule or mass noted. Pulmonary vasculature and the cardiomediastinal silhouette are within normal limits. IMPRESSION: No radiographic evidence of acute cardiopulmonary disease. Electronically Signed   By: Vinnie Langton M.D.   On: 06/18/2021 08:02   DG Abd 1 View  Result Date: 06/18/2021 CLINICAL DATA:  Weakness and abdominal pain EXAM: ABDOMEN - 1 VIEW COMPARISON:  05/26/2021 CT.  06/14/2019 plain film. FINDINGS: Cholecystectomy clips. Non-obstructive bowel gas pattern. No abnormal abdominal calcifications. No appendicolith. IMPRESSION: No acute findings.  Electronically Signed   By: Abigail Miyamoto M.D.   On: 06/18/2021 12:07   CT ABDOMEN PELVIS W CONTRAST  Result Date: 05/26/2021 CLINICAL DATA:  Unspecified abdominal pain, pancreatitis EXAM: CT ABDOMEN AND PELVIS WITH CONTRAST TECHNIQUE: Multidetector CT imaging of the abdomen and pelvis was performed using the standard protocol following bolus administration of intravenous contrast. CONTRAST:  10mL OMNIPAQUE IOHEXOL 300 MG/ML  SOLN COMPARISON:  05/11/2019 FINDINGS: Lower chest: No acute abnormality. Hepatobiliary: No focal liver abnormality is seen. Status post cholecystectomy. No biliary dilatation. Pancreas: Unremarkable. No pancreatic ductal dilatation or surrounding inflammatory changes. 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 The spleen is otherwise unremarkable. Adrenals/Urinary Tract: Adrenal glands are unremarkable. The kidneys are normal in size and position. Subcentimeter hypodensity within the left kidney likely represent tiny cortical cysts are stable since prior examination. The kidneys are otherwise unremarkable. Bladder unremarkable. Stomach/Bowel: Moderate descending colonic diverticulosis. The stomach, small bowel, and large bowel are otherwise unremarkable. Appendix normal. No free intraperitoneal gas or fluid. Vascular/Lymphatic: No significant vascular findings are present. No enlarged abdominal or pelvic lymph nodes. Reproductive: Status post hysterectomy. No adnexal masses. Other: No abdominal wall hernia or abnormality. No abdominopelvic ascites. Musculoskeletal: No acute bone abnormality. No lytic or  blastic bone lesion. IMPRESSION: No acute intra-abdominal pathology identified. No CT evidence of acute pancreatitis. 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 Descending colonic diverticulosis without superimposed acute inflammatory change. Electronically Signed   By: Fidela Salisbury M.D.   On: 05/26/2021 21:58   US RENAL  Result Date: 06/18/2021 CLINICAL DATA:  Acute kidney injury EXAM: RENAL / URINARY TRACT ULTRASOUND COMPLETE COMPARISON:  CT abdomen and pelvis dated May 26, 2021 FINDINGS: Right Kidney: Renal measurements: 9.8 x 4.4 x 4.4 cm = volume: 99 mL. Echogenicity within normal limits. No mass or hydronephrosis visualized. Left Kidney: Renal measurements: 10.5 x 4.9 x 4.5 cm = volume: 122 mL. Echogenicity within normal limits. No mass or hydronephrosis visualized. Bladder: Appears normal for degree of bladder distention. Bilateral jets visualized. Other: None. IMPRESSION: No hydronephrosis. Electronically Signed   By: Yetta Glassman M.D.   On: 06/18/2021 12:02   US THYROID  Result Date: 05/30/2021 CLINICAL DATA:  Hyperthyroidism EXAM: THYROID ULTRASOUND TECHNIQUE: Ultrasound examination of the thyroid gland and adjacent soft tissues was performed. COMPARISON:  None. FINDINGS: Parenchymal Echotexture: Mildly heterogeneous Isthmus: 0.7 cm Right lobe: 3.0 x 1.6 x 1.5 cm Left lobe: 4.3 x 2.4 x 2.4 cm _________________________________________________________ Estimated total number of nodules >/= 1 cm: 3 Number of spongiform nodules >/=  2 cm not described below (TR1): 0 Number of mixed cystic and solid nodules >/= 1.5 cm not described below (TR2): 0 _________________________________________________________ Nodule # 1: Location: Isthmus; superior Maximum size: 2.5 cm; Other 2 dimensions: 1.5 x 1.9 cm Composition: solid/almost completely solid (2) Echogenicity: isoechoic (1) Shape: not taller-than-wide (0) Margins: smooth (0) Echogenic foci: punctate echogenic foci (3) ACR TI-RADS total points: 6. ACR TI-RADS risk category: TR4 (4-6 points). ACR TI-RADS recommendations: **Given size (>/= 1.5 cm) and appearance, fine  needle aspiration of this moderately suspicious nodule should be considered based on TI-RADS criteria. _________________________________________________________ Nodule # 2: Location: Right; mid Maximum size: 1.6 cm; Other 2 dimensions: 1.1 x 1.4 cm Composition: solid/almost completely solid (2) Echogenicity: isoechoic (1) Shape: not taller-than-wide (0) Margins: smooth (0) Echogenic foci: none (0) ACR TI-RADS total points: 3. ACR TI-RADS risk category: TR3 (3 points). ACR TI-RADS recommendations: *Given size (>/= 1.5 - 2.4 cm) and appearance, a follow-up ultrasound in 1 year should be considered based on TI-RADS criteria. _________________________________________________________ Nodule 3: 2 2.6 x 2.3 x 2.3 cm heterogeneous region in the mid left thyroid lobe is favored to be a pseudo nodule given lack of defined margins. IMPRESSION: 1. Nodule 1 (TI-RADS 4), measuring 2.5 x 1.5 x 1.9 cm, located in the isthmus, meets criteria for FNA. 2. Nodule 2 (TI-RADS 3), measuring 1.6 x 1.1 x 1.4 cm, located in the mid right thyroid lobe, meets criteria for imaging follow-up. Annual ultrasound surveillance is recommended until 5 years of stability is documented. The above is in keeping with the ACR TI-RADS recommendations - J Am Coll Radiol 2017;14:587-595. Electronically Signed   By: Miachel Roux M.D.   On: 05/30/2021 07:55    Microbiology: Results for orders placed or performed during the hospital encounter of 06/17/21  Urine Culture     Status: None   Collection Time: 06/18/21  8:23 AM   Specimen: Urine, Clean Catch  Result Value Ref Range Status   Specimen Description   Final    URINE, CLEAN CATCH Performed at [facility], [place] [address], [place], [place] [address]    Special Requests  Final    NONE Performed at [facility], [place] [address], [place], [place] [address]    Culture   Final    NO GROWTH Performed at [facility], [place] [address] [place], [place]  [address]    Report Status 06/19/2021 FINAL  Final    Labs: CBC: Recent Labs  Lab 06/18/21 0037 06/19/21 0422  WBC 7.5 5.4  NEUTROABS 4.3  --   HGB 10.5* 9.1*  HCT 31.9* 28.7*  MCV 91.9 96.0  PLT 135* 379*   Basic Metabolic Panel: Recent Labs  Lab 06/18/21 0037 06/18/21 0823 06/19/21 0422 06/19/21 1332  NA 135  --  142 140  K 3.9  --  4.2 4.3  CL 104  --  114* 111  CO2 20*  --  20* 24  GLUCOSE 102*  --  81 115*  BUN 87*  --  67* 52*  CREATININE 1.58*  --  1.13* 0.93  CALCIUM 10.1  --  10.2 10.1  MG  --  1.8  --   --    Liver Function Tests: Recent Labs  Lab 06/18/21 0037 06/19/21 0422  AST 23 24  ALT 24 22  ALKPHOS 72 61  BILITOT 0.8 1.0  PROT 7.3 6.3*  ALBUMIN 3.3* 2.8*   CBG: No results for input(s): GLUCAP in the last 168 hours.  Discharge time spent: less than 30 minutes.  Signed: Annita Brod, MD [facility] 06/19/2021

## 2021-06-19 NOTE — Assessment & Plan Note (Signed)
Rate controlled.  Resume metoprolol discharge.  Continued on Eliquis.  Underlying cause may be poor control of hyperthyroidism.

## 2021-06-19 NOTE — Assessment & Plan Note (Signed)
Restarting home medications upon discharge.

## 2021-06-19 NOTE — Assessment & Plan Note (Signed)
Referred to interventional radiology for fine-needle aspiration.  Second nodule needs to be monitored o

## 2021-06-19 NOTE — Progress Notes (Signed)
Cardiology Office Note   Date:  06/20/2021   ID:  Karen Ferguson, Karen Ferguson Mar 02, 1956, MRN [id number]  PCP:  Isaac Bliss, Rayford Halsted, MD  Cardiologist:   Minus Breeding, MD   Chief Complaint  Patient presents with   Atrial Fibrillation      History of Present Illness: Karen Ferguson is a 66 y.o. female who presents for evaluation of atrial fibrillation.   The patient was discharged from the hospital two days ago.  She was admitted with abdominal pain and she had AKI.  She was hydrated.  She is hyperthyroid but has not been taking her Tapazole.  TSH was very low this admisison.  She was in atrial fib and rate was controlled with beta blocker.  She has been treated with Eliquis.       She actually presented at the beginning of January with abdominal discomfort that is when she was found to have thyroid abnormality and atrial fibrillation.  She came back a second time with abdominal discomfort not taking her meds.  As far as I can see she was in sinus rhythm at the time of discharge.  She says she is felt her heart racing and skipping for years.  She has a sensation of sometimes it beating in her head.  She feels some headaches.  She has had PVCs documented before.  She has some rare dizziness.  However, its not clear that she has any symptoms consistent with sustained tachyarrhythmias.  She sometimes gets short of breath walking but she can do her own grocery shopping and driving and she lives independently.  She does not describe chest pressure routinely, neck or arm discomfort.  She chronically sleeps on 3-4 pillows.  She did have an echo in 2018 with some diastolic dysfunction.      Past Medical History:  Diagnosis Date   Arthritis    Asthma    Albuterol prn   Bronchitis    Degenerative disc disease    Depression    Diabetes mellitus without complication ([place])    Borderline diabetic in the past per pt   History of echocardiogram    Echo 1/18: EF 60-65, no RWMA, Gr 1 DD, PASP 20    Hypertension    PVC's (premature ventricular contractions)    Holter 12/17: NSR, occ PAC/PVCs, no AFib; one 4 beat run NSVT    Past Surgical History:  Procedure Laterality Date   ABDOMINAL HYSTERECTOMY  1988   for fibroid tumors   CARPAL TUNNEL RELEASE  1985   rt   [place] ARTHROSCOPY Right    TENDON REPAIR Right 03/03/2013   Procedure: RIGHT DEBRIDEMENT AND TENOLYSIS OF PERONEOUS LONGOUS AND BREVIS TENDONS ;  Surgeon: Wylene Simmer, MD;  Location: [facility];  Service: Orthopedics;  Laterality: Right;   TOOTH EXTRACTION N/A 03/25/2019   Procedure: DENTAL EXTRACTIONS TEETH NUMBER THREE, FOUR, FIVE, SIX, SEVEN, NINE, TWELVE, FOURTEEN, THIRTY AND ALVEOLOPLASTY;  Surgeon: Diona Browner, DDS;  Location: [facility];  Service: Oral Surgery;  Laterality: N/A;     Current Outpatient Medications  Medication Sig Dispense Refill   apixaban (ELIQUIS) 5 MG TABS tablet Take 1 tablet (5 mg total) by mouth 2 (two) times daily. 60 tablet 2   linaclotide (LINZESS) 290 MCG CAPS capsule TAKE 1 CAPSULE BY MOUTH AT LEAST 30 MINUTES BEFORE THE FIRST MEAL OF THE DAY ON AN EMPTY STOMACH (Patient taking differently: Take 290  mcg by mouth daily before breakfast.) 90 capsule 3   methimazole (TAPAZOLE) 10 MG tablet Take 3 tablets (30 mg total) by mouth 2 (two) times daily. 180 tablet 5   metoprolol tartrate (LOPRESSOR) 25 MG tablet Take 1 tablet (25 mg total) by mouth 2 (two) times daily. 60 tablet 2   pantoprazole (PROTONIX) 40 MG tablet Take 1 tablet (40 mg total) by mouth daily. (Patient taking differently: Take 40 mg by mouth every morning.) 30 tablet 0   valsartan-hydrochlorothiazide (DIOVAN-HCT) 80-12.5 MG tablet TAKE 1 TABLET BY MOUTH DAILY (Patient taking differently: Take 1 tablet by mouth every morning.) 90 tablet 3   acetaminophen (TYLENOL) 500 MG tablet Take 500 mg by mouth every 6 (six) hours as needed for mild pain. (Patient not taking: Reported on  06/20/2021)     albuterol (VENTOLIN HFA) 108 (90 Base) MCG/ACT inhaler INHALE 2 PUFFS INTO THE LUNGS FOUR TIMES DAILY AS NEEDED FOR SHORTNESS OF BREATH. (Patient not taking: Reported on 06/20/2021) 8.5 g 3   diphenhydrAMINE (BENADRYL) 25 mg capsule Take 25 mg by mouth every 6 (six) hours as needed for itching. (Patient not taking: Reported on 06/20/2021)     senna-docusate (SENOKOT-S) 8.6-50 MG tablet Take 1 tablet by mouth daily as needed for mild constipation. (Patient not taking: Reported on 06/20/2021) 30 tablet 1   No current facility-administered medications for this visit.    Allergies:   Shellfish allergy    Social History:  The patient  reports that she has never smoked. She has never used smokeless tobacco. She reports current alcohol use. She reports that she does not use drugs.   Family History:  The patient's family history includes Arrhythmia in her mother; Arthritis in her mother and sister; Cancer in her maternal uncle; Diabetes in her mother; Heart attack (age of onset: 31) in her maternal grandfather; Heart disease in her maternal aunt, maternal grandfather, maternal uncle, and sister; Heart failure in her maternal aunt; Hypertension in her brother and mother; Kidney disease in her mother; Thyroid disease in her mother and sister.   Of note she does report some vague history of the renal disease in her family but I do not see any detail on this.   ROS:  Please see the history of present illness.   Otherwise, review of systems are positive for none.   All other systems are reviewed and negative.    PHYSICAL EXAM: VS:  BP (!) 106/54 (BP Location: Left Arm, Patient Position: Sitting, Cuff Size: Normal)    Pulse 84    Ht 5\' 4"  (1.626 m)    Wt 178 lb 12.8 oz (81.1 kg)    SpO2 97%    BMI 30.69 kg/m  , BMI Body mass index is 30.69 kg/m. GENERAL:  Well appearing HEENT:  Pupils equal round and reactive, fundi not visualized, oral mucosa unremarkable NECK:  No jugular venous distention,  waveform within normal limits, carotid upstroke brisk and symmetric, no bruits, no thyromegaly LYMPHATICS:  No cervical, inguinal adenopathy LUNGS:  Clear to auscultation bilaterally BACK:  No CVA tenderness CHEST:  Unremarkable HEART:  PMI not displaced or sustained,S1 and S2 within normal limits, no S3, no S4, no clicks, no rubs, grade 2 out of 6 apical and mid right sternal border systolic murmur, no diastolic murmurs ABD:  Flat, positive bowel sounds normal in frequency in pitch, no bruits, no rebound, no guarding, no midline pulsatile mass, no hepatomegaly, no splenomegaly EXT:  2 plus pulses throughout, no edema, no  cyanosis no clubbing SKIN:  No rashes no nodules NEURO:  Cranial nerves II through XII grossly intact, motor grossly intact throughout PSYCH:  Cognitively intact, oriented to person place and time    EKG:  EKG is not ordered today. The ekg ordered 06/18/2020 demonstrates sinus rhythm, rate 92, axis within normal limits, intervals within normal limits, no acute ST-T wave changes.   Recent Labs: 06/18/2021: Magnesium 1.8; TSH <0.010 06/19/2021: ALT 22; BUN 52; Creatinine, Ser 0.93; Hemoglobin 9.1; Platelets 115; Potassium 4.3; Sodium 140    Lipid Panel    Component Value Date/Time   CHOL 164 01/10/2015 1009   TRIG 49 05/27/2021 0300   HDL 47 01/10/2015 1009   CHOLHDL 3.5 01/10/2015 1009   VLDL 20 01/10/2015 1009   LDLCALC 97 01/10/2015 1009      Wt Readings from Last 3 Encounters:  06/20/21 178 lb 12.8 oz (81.1 kg)  06/17/21 187 lb 6.3 oz (85 kg)  06/05/21 182 lb 6.4 oz (82.7 kg)      Other studies Reviewed: Additional studies/ records that were reviewed today include: Extensive review of hospital records. Review of the above records demonstrates:  Please see elsewhere in the note.     ASSESSMENT AND PLAN:  ATRIAL FIB: The patient has paroxysmal atrial fibrillation probably related to her thyroid abnormalities.  She is not taking her medications.  It is  reasonable to continue the beta-blocker and the Eliquis so I might be able to discontinue this in several months once her thyroid is normalized.  HYPERTHYROIDISM: She is following with endocrinology.  THYROID NODULE: She reports she is to have a biopsy.  CKD II: Her creatinine has been mildly elevated and can be followed.  MURMUR: The patient has a systolic murmur and is describing some shortness of breath.  I will order an echocardiogram.   Current medicines are reviewed at length with the patient today  The patient does not have concerns regarding medicines.  The following changes have been made:  no change  Labs/ tests ordered today include:   Orders Placed This Encounter  Procedures   ECHOCARDIOGRAM COMPLETE     Disposition:   FU with me or APP in 4 months.   Signed, Minus Breeding, MD  06/20/2021 3:30 PM    [place]

## 2021-06-19 NOTE — Progress Notes (Signed)
D/C instructions reviewed w/ pt. Pt verbalizes understanding and all questions answered. Pt d/c in w/c to dtr's car in stable condition. Pt in possession of d/c packet and all personal belongings.

## 2021-06-19 NOTE — Assessment & Plan Note (Signed)
May be secondary to hyperthyroidism versus gastroenteritis.  Acute issue resolved.  Patient tolerating p.o.

## 2021-06-20 ENCOUNTER — Encounter: Payer: Self-pay | Admitting: Cardiology

## 2021-06-20 ENCOUNTER — Other Ambulatory Visit: Payer: Self-pay

## 2021-06-20 ENCOUNTER — Ambulatory Visit (INDEPENDENT_AMBULATORY_CARE_PROVIDER_SITE_OTHER): Payer: Medicare Other | Admitting: Cardiology

## 2021-06-20 VITALS — BP 106/54 | HR 84 | Ht 64.0 in | Wt 178.8 lb

## 2021-06-20 DIAGNOSIS — R011 Cardiac murmur, unspecified: Secondary | ICD-10-CM

## 2021-06-20 DIAGNOSIS — I480 Paroxysmal atrial fibrillation: Secondary | ICD-10-CM | POA: Diagnosis not present

## 2021-06-20 NOTE — Patient Instructions (Signed)
Medication Instructions:  Your Physician recommend you continue on your current medication as directed.    *If you need a refill on your cardiac medications before your next appointment, please call your pharmacy*   Testing/Procedures: Your physician has requested that you have an echocardiogram. Echocardiography is a painless test that uses sound waves to create images of your heart. It provides your doctor with information about the size and shape of your heart and how well your heart's chambers and valves are working. This procedure takes approximately one hour. There are no restrictions for this procedure.   Follow-Up: At [facility], you and your health needs are our priority.  As part of our continuing mission to provide you with exceptional heart care, we have created designated Provider Care Teams.  These Care Teams include your primary Cardiologist (physician) and Advanced Practice Providers (APPs -  Physician Assistants and Nurse Practitioners) who all work together to provide you with the care you need, when you need it.  We recommend signing up for the patient portal called "MyChart".  Sign up information is provided on this After Visit Summary.  MyChart is used to connect with patients for Virtual Visits (Telemedicine).  Patients are able to view lab/test results, encounter notes, upcoming appointments, etc.  Non-urgent messages can be sent to your provider as well.   To learn more about what you can do with MyChart, go to [email or web address].    Your next appointment:   4 month(s)  The format for your next appointment:   In Person  Provider:   Almyra Deforest, PA-C

## 2021-06-23 ENCOUNTER — Emergency Department (HOSPITAL_COMMUNITY): Payer: Medicare Other

## 2021-06-23 ENCOUNTER — Other Ambulatory Visit: Payer: Self-pay

## 2021-06-23 ENCOUNTER — Emergency Department (HOSPITAL_COMMUNITY)
Admission: EM | Admit: 2021-06-23 | Discharge: 2021-06-23 | Disposition: A | Discharge status: Home / Self Care | Payer: Medicare Other | Attending: Emergency Medicine | Admitting: Emergency Medicine

## 2021-06-23 DIAGNOSIS — R222 Localized swelling, mass and lump, trunk: Secondary | ICD-10-CM | POA: Insufficient documentation

## 2021-06-23 DIAGNOSIS — Z20822 Contact with and (suspected) exposure to covid-19: Secondary | ICD-10-CM | POA: Insufficient documentation

## 2021-06-23 DIAGNOSIS — R1084 Generalized abdominal pain: Secondary | ICD-10-CM | POA: Diagnosis present

## 2021-06-23 DIAGNOSIS — R531 Weakness: Secondary | ICD-10-CM | POA: Insufficient documentation

## 2021-06-23 DIAGNOSIS — E0590 Thyrotoxicosis, unspecified without thyrotoxic crisis or storm: Secondary | ICD-10-CM | POA: Insufficient documentation

## 2021-06-23 DIAGNOSIS — Z79899 Other long term (current) drug therapy: Secondary | ICD-10-CM | POA: Insufficient documentation

## 2021-06-23 LAB — CBC WITH DIFFERENTIAL/PLATELET
Abs Immature Granulocytes: 0.02 10*3/uL (ref 0.00–0.07)
Basophils Absolute: 0 10*3/uL (ref 0.0–0.1)
Basophils Relative: 0 %
Eosinophils Absolute: 0.2 10*3/uL (ref 0.0–0.5)
Eosinophils Relative: 3 %
HCT: 29.7 % — ABNORMAL LOW (ref 36.0–46.0)
Hemoglobin: 10 g/dL — ABNORMAL LOW (ref 12.0–15.0)
Immature Granulocytes: 0 %
Lymphocytes Relative: 28 %
Lymphs Abs: 1.7 10*3/uL (ref 0.7–4.0)
MCH: 30 pg (ref 26.0–34.0)
MCHC: 33.7 g/dL (ref 30.0–36.0)
MCV: 89.2 fL (ref 80.0–100.0)
Monocytes Absolute: 0.9 10*3/uL (ref 0.1–1.0)
Monocytes Relative: 15 %
Neutro Abs: 3.2 10*3/uL (ref 1.7–7.7)
Neutrophils Relative %: 54 %
Platelets: 203 10*3/uL (ref 150–400)
RBC: 3.33 MIL/uL — ABNORMAL LOW (ref 3.87–5.11)
RDW: 11.9 % (ref 11.5–15.5)
WBC: 5.9 10*3/uL (ref 4.0–10.5)
nRBC: 0 % (ref 0.0–0.2)

## 2021-06-23 LAB — COMPREHENSIVE METABOLIC PANEL
ALT: 29 U/L (ref 0–44)
AST: 31 U/L (ref 15–41)
Albumin: 2.9 g/dL — ABNORMAL LOW (ref 3.5–5.0)
Alkaline Phosphatase: 79 U/L (ref 38–126)
Anion gap: 11 (ref 5–15)
BUN: 15 mg/dL (ref 8–23)
CO2: 22 mmol/L (ref 22–32)
Calcium: 10.7 mg/dL — ABNORMAL HIGH (ref 8.9–10.3)
Chloride: 100 mmol/L (ref 98–111)
Creatinine, Ser: 0.96 mg/dL (ref 0.44–1.00)
GFR, Estimated: >60 mL/min (ref >60)
Glucose, Bld: 109 mg/dL — ABNORMAL HIGH (ref 70–99)
Potassium: 3.3 mmol/L — ABNORMAL LOW (ref 3.5–5.1)
Sodium: 133 mmol/L — ABNORMAL LOW (ref 135–145)
Total Bilirubin: 0.7 mg/dL (ref 0.3–1.2)
Total Protein: 6.8 g/dL (ref 6.5–8.1)

## 2021-06-23 LAB — T4, FREE: Free T4: 3.69 ng/dL — ABNORMAL HIGH (ref 0.61–1.12)

## 2021-06-23 LAB — RESP PANEL BY RT-PCR (FLU A&B, COVID) ARPGX2
Influenza A by PCR: NEGATIVE
Influenza B by PCR: NEGATIVE
SARS Coronavirus 2 by RT PCR: NEGATIVE

## 2021-06-23 LAB — TSH: TSH: <0.01 u[IU]/mL — ABNORMAL LOW (ref 0.350–4.500)

## 2021-06-23 LAB — LIPASE, BLOOD: Lipase: 76 U/L — ABNORMAL HIGH (ref 11–51)

## 2021-06-23 MED ORDER — ONDANSETRON HCL 4 MG PO TABS: 4.0000 mg | ORAL_TABLET | Freq: Three times a day (TID) | ORAL | 0 refills | Status: DC | PRN

## 2021-06-23 MED ORDER — ONDANSETRON HCL 4 MG/2ML IJ SOLN
4.0000 mg | Freq: Once | INTRAMUSCULAR | Status: AC
  Administered 2021-06-23: 4 mg via INTRAVENOUS
  Filled 2021-06-23: qty 2

## 2021-06-23 MED ORDER — SODIUM CHLORIDE 0.9 % IV BOLUS
500.0000 mL | Freq: Once | INTRAVENOUS | Status: AC
  Administered 2021-06-23: 500 mL via INTRAVENOUS

## 2021-06-23 NOTE — ED Notes (Signed)
Patient transported to X-ray 

## 2021-06-23 NOTE — Discharge Instructions (Addendum)
You were seen in the emergency department for nausea vomiting abdominal pain.  You had lab work and an Publishing rights manager.  Your thyroid levels are still high and you need to keep your appointment to get your thyroid biopsy.  Please continue your regular medications.  Follow-up with your primary care doctor.  We are prescribing you some nausea medication if needed.  Turn to the emergency department if any worsening or concerning symptoms

## 2021-06-23 NOTE — ED Provider Notes (Signed)
[facility] EMERGENCY DEPARTMENT Provider Note   CSN: [id number] Arrival date & time: 06/23/21  1245     History  Chief Complaint  Patient presents with   Abdominal Pain   Emesis   Weakness    Karen Ferguson is a 66 y.o. female.  She is here with multiple complaints.  She said she has a ball on her left lower back and she wants to know what it is.  It sometimes causes pain.  She is also has nausea and abdominal pain which she follows with GI for and are chronic.  She feels generally weak.  States her blood pressures were low this morning.  She states he has been taking medications as prescribed.  She was just in the hospital for A. fib and chronic abdominal pain.  Found to be hypothyroid and needs to have thyroid nodule biopsied.  The history is provided by the patient.  Abdominal Pain Pain location:  Generalized Pain quality: aching   Pain severity:  Moderate Timing:  Intermittent Progression:  Unchanged Chronicity:  Chronic Context: not trauma   Relieved by:  Nothing Worsened by:  Nothing Ineffective treatments:  None tried Associated symptoms: fatigue and nausea   Associated symptoms: no chest pain, no dysuria, no fever, no shortness of breath and no sore throat   Weakness Severity:  Moderate Onset quality:  Unable to specify Timing:  Intermittent Progression:  Unchanged Relieved by:  Nothing Worsened by:  Activity Ineffective treatments:  None tried Associated symptoms: abdominal pain and nausea   Associated symptoms: no chest pain, no dysuria, no fever, no headaches, no loss of consciousness and no shortness of breath       Home Medications Prior to Admission medications   Medication Sig Start Date End Date Taking? Authorizing Provider  acetaminophen (TYLENOL) 500 MG tablet Take 500 mg by mouth every 6 (six) hours as needed for mild pain. Patient not taking: Reported on 06/20/2021    [provider]  albuterol (VENTOLIN HFA) 108 (90  Base) MCG/ACT inhaler INHALE 2 PUFFS INTO THE LUNGS FOUR TIMES DAILY AS NEEDED FOR SHORTNESS OF BREATH. Patient not taking: Reported on 06/20/2021 06/21/20   Tisovec, Fransico Him, MD  apixaban (ELIQUIS) 5 MG TABS tablet Take 1 tablet (5 mg total) by mouth 2 (two) times daily. 05/30/21   Florencia Reasons, MD  diphenhydrAMINE (BENADRYL) 25 mg capsule Take 25 mg by mouth every 6 (six) hours as needed for itching. Patient not taking: Reported on 06/20/2021    [provider]  linaclotide (LINZESS) 290 MCG CAPS capsule TAKE 1 CAPSULE BY MOUTH AT LEAST 30 MINUTES BEFORE THE FIRST MEAL OF THE DAY ON AN EMPTY STOMACH Patient taking differently: Take 290 mcg by mouth daily before breakfast. 04/13/20   Ronnette Juniper, MD  methimazole (TAPAZOLE) 10 MG tablet Take 3 tablets (30 mg total) by mouth 2 (two) times daily. 06/05/21   Renato Shin, MD  metoprolol tartrate (LOPRESSOR) 25 MG tablet Take 1 tablet (25 mg total) by mouth 2 (two) times daily. 05/30/21   Florencia Reasons, MD  pantoprazole (PROTONIX) 40 MG tablet Take 1 tablet (40 mg total) by mouth daily. Patient taking differently: Take 40 mg by mouth every morning. 05/30/21   Florencia Reasons, MD  senna-docusate (SENOKOT-S) 8.6-50 MG tablet Take 1 tablet by mouth daily as needed for mild constipation. Patient not taking: Reported on 06/20/2021 06/19/21   Annita Brod, MD  valsartan-hydrochlorothiazide (DIOVAN-HCT) 80-12.5 MG tablet TAKE 1 TABLET BY MOUTH  DAILY Patient taking differently: Take 1 tablet by mouth every morning. 08/13/20 08/13/21  Tisovec, Fransico Him, MD      Allergies    Shellfish allergy    Review of Systems   Review of Systems  Constitutional:  Positive for fatigue. Negative for fever.  HENT:  Negative for sore throat.   Eyes:  Negative for visual disturbance.  Respiratory:  Negative for shortness of breath.   Cardiovascular:  Negative for chest pain.  Gastrointestinal:  Positive for abdominal pain and nausea.  Genitourinary:  Negative for dysuria.   Musculoskeletal:  Positive for back pain.  Skin:  Negative for rash.  Neurological:  Positive for weakness. Negative for loss of consciousness and headaches.   Physical Exam Updated Vital Signs BP (!) 115/57    Pulse 97    Temp 99 F (37.2 C) (Oral)    Resp 16    Ht 5\' 4"  (1.626 m)    Wt 77.1 kg    SpO2 97%    BMI 29.18 kg/m  Physical Exam Vitals and nursing note reviewed.  Constitutional:      General: She is not in acute distress.    Appearance: Normal appearance. She is well-developed.  HENT:     Head: Normocephalic and atraumatic.  Eyes:     Conjunctiva/sclera: Conjunctivae normal.  Cardiovascular:     Rate and Rhythm: Normal rate and regular rhythm.     Heart sounds: No murmur heard. Pulmonary:     Effort: Pulmonary effort is normal. No respiratory distress.     Breath sounds: Normal breath sounds.  Abdominal:     Palpations: Abdomen is soft.     Tenderness: There is no abdominal tenderness.  Musculoskeletal:        General: No swelling, deformity or signs of injury.     Cervical back: Neck supple.     Comments: She has a soft tissue mass in her left paralumbar area.  Approximately 6 cm.  No overlying erythema.  No fluctuance.  Question lipoma.  Skin:    General: Skin is warm and dry.     Capillary Refill: Capillary refill takes less than 2 seconds.  Neurological:     General: No focal deficit present.     Mental Status: She is alert.  Psychiatric:        Mood and Affect: Mood normal.    ED Results / Procedures / Treatments   Labs (all labs ordered are listed, but only abnormal results are displayed) Labs Reviewed  COMPREHENSIVE METABOLIC PANEL - Abnormal; Notable for the following components:      Result Value   Sodium 133 (*)    Potassium 3.3 (*)    Glucose, Bld 109 (*)    Calcium 10.7 (*)    Albumin 2.9 (*)    All other components within normal limits  CBC WITH DIFFERENTIAL/PLATELET - Abnormal; Notable for the following components:   RBC 3.33 (*)     Hemoglobin 10.0 (*)    HCT 29.7 (*)    All other components within normal limits  LIPASE, BLOOD - Abnormal; Notable for the following components:   Lipase 76 (*)    All other components within normal limits  TSH - Abnormal; Notable for the following components:   TSH <0.010 (*)    All other components within normal limits  T4, FREE - Abnormal; Notable for the following components:   Free T4 3.69 (*)    All other components within normal limits  RESP PANEL BY  RT-PCR (FLU A&B, COVID) ARPGX2  T3, FREE    EKG EKG Interpretation  Date/Time:  Sunday June 23 2021 13:17:32 EST Ventricular Rate:  90 PR Interval:  163 QRS Duration: 84 QT Interval:  360 QTC Calculation: 441 R Axis:   25 Text Interpretation: Sinus rhythm Borderline T abnormalities, anterior leads Minimal ST elevation, inferior leads No significant change since prior 1/23 Confirmed by Aletta Edouard [phone]) on 06/23/2021 1:21:11 PM  Radiology DG Abdomen Acute W/Chest  Result Date: 06/23/2021 CLINICAL DATA:  Abdominal pain EXAM: DG ABDOMEN ACUTE WITH 1 VIEW CHEST COMPARISON:  06/18/2021 FINDINGS: Numerous air-filled loops of large and small bowel throughout the abdomen with multiple air-fluid levels. Small bowel distension of up to 2.5 cm. No radiopaque calculi or other significant radiographic abnormality is seen. Heart size and mediastinal contours are within normal limits. Both lungs are clear. IMPRESSION: 1. Findings most consistent with enterocolitis or ileus. No dilated small bowel loops to suggest bowel obstruction at this time. 2. No acute cardiopulmonary disease. Electronically Signed   By: Davina Poke D.O.   On: 06/23/2021 16:39    Procedures Procedures    Medications Ordered in ED Medications  ondansetron (ZOFRAN) injection 4 mg (4 mg Intravenous Given 06/23/21 1328)  sodium chloride 0.9 % bolus 500 mL (0 mLs Intravenous Stopped 06/23/21 1359)    ED Course/ Medical Decision Making/ A&P Clinical Course  as of 06/23/21 2206  Sun Jun 23, 2021  1311 Patient's last CT abdomen and pelvis was done 4 weeks ago and was negative for any acute findings. [MB]  2831 Patient's lab work has been fairly similar to her priors.  TSH still undetectable.  Patient states she has been compliant with her medications.  No evidence of thyroid crisis. [MB]  1630 Abdominal series x-rays interpreted by me as no acute infiltrates no free air, nonspecific bowel gas pattern.  Awaiting radiology reading [MB]    Clinical Course User Index [MB] Hayden Rasmussen, MD                           Medical Decision Making Amount and/or Complexity of Data Reviewed Labs: ordered. Radiology: ordered.  Risk Prescription drug management.  This patient complains of generalized abdominal pain, fluctuating blood pressures, soft tissue mass left flank, hypothyroidism; this involves an extensive number of treatment Options and is a complaint that carries with it a high risk of complications and Morbidity. The differential includes poor thyroid, diverticulitis, colitis, anemia, metabolic derangement COVID, flu  I ordered, reviewed and interpreted labs, which included CBC with normal white count, hemoglobin Low similar to priors, chemistries with low sodium low potassium TSH and T4 priors, COVID and flu negative  I ordered medication IV fluids and nausea medication with improvement in her symptoms I ordered imaging studies which included acute abdominal series and I independently    visualized and interpreted imaging which showed no acute findings  Previous records obtained and reviewed in epic including recent admission for AKI   After the interventions stated above, I reevaluated the patient and found patient be otherwise well-appearing.  No indications for admission at this time.  Mother close follow-up with her treating providers.  Return instructions discussed MYA SUELL was evaluated in Emergency Department on 06/23/2021 for the  symptoms described in the history of present illness. She was evaluated in the context of the global COVID-19 pandemic, which necessitated consideration that the patient might be at risk for infection with  the SARS-CoV-2 virus that causes COVID-19. Institutional protocols and algorithms that pertain to the evaluation of patients at risk for COVID-19 are in a state of rapid change based on information released by regulatory bodies including the CDC and federal and state organizations. These policies and algorithms were followed during the patient's care in the ED.          Final Clinical Impression(s) / ED Diagnoses Final diagnoses:  Generalized abdominal pain  Hyperthyroidism    Rx / DC Orders ED Discharge Orders     None         Hayden Rasmussen, MD 06/23/21 2210

## 2021-06-23 NOTE — ED Notes (Signed)
Pt d/c home per MD order. Discharge summary reviewed, pt verbalizes understanding. Ambulatory off unit. No s/s of acute distress noted Reports daughter is discharge ride home

## 2021-06-23 NOTE — ED Triage Notes (Signed)
Pt to ED via POV, presents with multiple complaints. Reports discharged from [facility] last week with dehydration. Currently reports increased weakness, nausea, vomiting, abdominal pain, blood pressure changes and chronic left leg pain.

## 2021-06-25 LAB — T3, FREE: T3, Free: 9.2 pg/mL — ABNORMAL HIGH (ref 2.0–4.4)

## 2021-06-26 ENCOUNTER — Ambulatory Visit: Payer: Commercial Managed Care - HMO | Admitting: Endocrinology

## 2021-06-27 ENCOUNTER — Telehealth: Payer: Self-pay | Admitting: Internal Medicine

## 2021-06-27 NOTE — Telephone Encounter (Signed)
Shauna Hugh with [facility] called in trying to schedule an appointment for patient for a HFU. Patient is listed as a Runner, broadcasting/film/video patient but doesn't have any prior visits with Dr.Hernandez.  Is this okay to schedule?  Caryl Pina could be contacted at [phone].  Please advise.

## 2021-06-28 ENCOUNTER — Other Ambulatory Visit (HOSPITAL_COMMUNITY): Payer: Self-pay

## 2021-06-28 MED ORDER — METHIMAZOLE 10 MG PO TABS: 20.0000 mg | ORAL_TABLET | Freq: Every day | ORAL | 0 refills | Status: DC

## 2021-07-01 NOTE — Telephone Encounter (Signed)
Karen Ferguson states she has scheduled a local HFU appt for pt in [place].   Will call pt to schedule new patient appt with Dr Jerilee Hoh. LVM instructions for pt to call office to schedule new patient appt with Dr Jerilee Hoh. Pt MUST keep existing appts with other providers (specifically the hospital f/u).

## 2021-07-02 ENCOUNTER — Other Ambulatory Visit: Payer: Self-pay

## 2021-07-02 ENCOUNTER — Other Ambulatory Visit (HOSPITAL_COMMUNITY): Payer: Self-pay

## 2021-07-02 ENCOUNTER — Ambulatory Visit (INDEPENDENT_AMBULATORY_CARE_PROVIDER_SITE_OTHER): Payer: Medicare Other | Admitting: Endocrinology

## 2021-07-02 VITALS — BP 130/70 | HR 77 | Ht 64.0 in | Wt 184.4 lb

## 2021-07-02 DIAGNOSIS — E0590 Thyrotoxicosis, unspecified without thyrotoxic crisis or storm: Secondary | ICD-10-CM

## 2021-07-02 MED ORDER — METHIMAZOLE 10 MG PO TABS
20.0000 mg | ORAL_TABLET | Freq: Every day | ORAL | 3 refills | Status: DC
Start: 2021-07-02 — End: 2021-08-09
  Filled 2021-07-02 – 2021-07-08 (×2): qty 180, 90d supply, fill #0

## 2021-07-02 NOTE — Progress Notes (Signed)
Subjective:    Patient ID: Karen Ferguson, female    DOB: [birth year], 66 y.o.   MRN: [id number]  HPI Pt returns for f/u of hyperthyroidism (dx'ed early 2023 (TSH was normal 1 year earlier); tapazole was chosen as initial rx, due to severity).  She has been in [place], and [facility].  Pt says she takes tapazole as rx'ed, but noncompliance was noted in hosp record.  She has intermitt palpitations.  She was noted to have MNG; Bx was requested 06/19/21. Past Medical History:  Diagnosis Date   Arthritis    Asthma    Albuterol prn   Bronchitis    Degenerative disc disease    Depression    Diabetes mellitus without complication ([place])    Borderline diabetic in the past per pt   History of echocardiogram    Echo 1/18: EF 60-65, no RWMA, Gr 1 DD, PASP 20   Hypertension    PVC's (premature ventricular contractions)    Holter 12/17: NSR, occ PAC/PVCs, no AFib; one 4 beat run NSVT    Past Surgical History:  Procedure Laterality Date   ABDOMINAL HYSTERECTOMY  1988   for fibroid tumors   CARPAL TUNNEL RELEASE  1985   rt   [place] ARTHROSCOPY Right    TENDON REPAIR Right 03/03/2013   Procedure: RIGHT DEBRIDEMENT AND TENOLYSIS OF PERONEOUS LONGOUS AND BREVIS TENDONS ;  Surgeon: Wylene Simmer, MD;  Location: [place];  Service: Orthopedics;  Laterality: Right;   TOOTH EXTRACTION N/A 03/25/2019   Procedure: DENTAL EXTRACTIONS TEETH NUMBER THREE, FOUR, FIVE, SIX, SEVEN, NINE, TWELVE, FOURTEEN, THIRTY AND ALVEOLOPLASTY;  Surgeon: Diona Browner, DDS;  Location: [place];  Service: Oral Surgery;  Laterality: N/A;    Social History   Socioeconomic History   Marital status: Single    Spouse name: Not on file   Number of children: 1   Years of education: Not on file   Highest education level: Not on file  Occupational History   Not on file  Tobacco Use   Smoking status: Never   Smokeless tobacco: Never  Vaping Use   Vaping Use: Never  used  Substance and Sexual Activity   Alcohol use: Yes    Comment: occasional   Drug use: No   Sexual activity: Not Currently  Other Topics Concern   Not on file  Social History Narrative   Works at [employer] ([employer]) in [employer].     Single   1 daughter - passed away at age 39   Lives with her 2 grandchildren.   Right Handed   Social Determinants of Health   Financial Resource Strain: Not on file  Food Insecurity: Not on file  Transportation Needs: Not on file  Physical Activity: Not on file  Stress: Not on file  Social Connections: Not on file  Intimate Partner Violence: Not on file    Current Outpatient Medications on File Prior to Visit  Medication Sig Dispense Refill   acetaminophen (TYLENOL) 500 MG tablet Take 500 mg by mouth every 6 (six) hours as needed for mild pain.     albuterol (VENTOLIN HFA) 108 (90 Base) MCG/ACT inhaler INHALE 2 PUFFS INTO THE LUNGS FOUR TIMES DAILY AS NEEDED FOR SHORTNESS OF BREATH. 8.5 g 3   apixaban (ELIQUIS) 5 MG TABS tablet Take 1 tablet (5 mg total) by mouth 2 (two) times daily. 60 tablet 2  diphenhydrAMINE (BENADRYL) 25 mg capsule Take 25 mg by mouth every 6 (six) hours as needed for itching.     linaclotide (LINZESS) 290 MCG CAPS capsule TAKE 1 CAPSULE BY MOUTH AT LEAST 30 MINUTES BEFORE THE FIRST MEAL OF THE DAY ON AN EMPTY STOMACH (Patient taking differently: Take 290 mcg by mouth daily before breakfast.) 90 capsule 3   metoprolol tartrate (LOPRESSOR) 25 MG tablet Take 1 tablet (25 mg total) by mouth 2 (two) times daily. 60 tablet 2   ondansetron (ZOFRAN) 4 MG tablet Take 1 tablet (4 mg total) by mouth every 8 (eight) hours as needed for nausea or vomiting. 15 tablet 0   pantoprazole (PROTONIX) 40 MG tablet Take 1 tablet (40 mg total) by mouth daily. (Patient taking differently: Take 40 mg by mouth every morning.) 30 tablet 0   senna-docusate (SENOKOT-S) 8.6-50 MG tablet Take 1 tablet by mouth daily as needed for mild  constipation. 30 tablet 1   valsartan-hydrochlorothiazide (DIOVAN-HCT) 80-12.5 MG tablet TAKE 1 TABLET BY MOUTH DAILY (Patient taking differently: Take 1 tablet by mouth every morning.) 90 tablet 3   No current facility-administered medications on file prior to visit.    Allergies  Allergen Reactions   Shellfish Allergy Hives    Family History  Problem Relation Age of Onset   Thyroid disease Mother    Arthritis Mother    Hypertension Mother    Diabetes Mother    Kidney disease Mother    Arrhythmia Mother        AFib   Thyroid disease Sister    Arthritis Sister    Heart disease Sister    Hypertension Brother    Heart disease Maternal Grandfather    Heart attack Maternal Grandfather 83       MI   Heart disease Maternal Aunt    Heart failure Maternal Aunt    Heart disease Maternal Uncle    Cancer Maternal Uncle     BP 130/70    Pulse 77    Ht 5\' 4"  (1.626 m)    Wt 184 lb 6.4 oz (83.6 kg)    SpO2 98%    BMI 31.65 kg/m   Review of Systems Denies fever.      Objective:   Physical Exam VITAL SIGNS:  See vs page GENERAL: no distress NECK: thyroid is 3 times normal size, with smooth surface  Lab Results  Component Value Date   TSH <0.010 (L) 06/23/2021       Assessment & Plan:  Hyperthyroidism: uncontrolled: I advised pt to take 30 mg BID, but she declines to increase beyond 20 mg QD.  MNG: we discussed low risk of malignancy, but I am fine with doing the bx.  We discussed.  She wants to go ahead.   Patient Instructions  Please continue the same methimazole.  If ever you have fever while taking methimazole, stop it and call us, even if the reason is obvious, because of the risk of a rare side-effect.  It is best to never miss the medication.  However, if you do miss it, next best is to double up the next time.   you will receive a phone call, about a day and time for an appointment, for the biopsy.   Please come back for a follow-up appointment in 1 month.

## 2021-07-02 NOTE — Patient Instructions (Addendum)
Please continue the same methimazole.  If ever you have fever while taking methimazole, stop it and call us, even if the reason is obvious, because of the risk of a rare side-effect.  It is best to never miss the medication.  However, if you do miss it, next best is to double up the next time.   you will receive a phone call, about a day and time for an appointment, for the biopsy.   Please come back for a follow-up appointment in 1 month.

## 2021-07-05 ENCOUNTER — Telehealth: Payer: Self-pay | Admitting: Cardiology

## 2021-07-05 NOTE — Telephone Encounter (Signed)
Pt is calling in about upcoming echo scheduled for 07/10/21.  Pt wanted to let Dr. Percival Spanish know that she was recently admitted to [facility] at the end of Jan, and they did an echo on her, on 06/24/21.  Pt is asking if another repeat echo is necessary, and would he be able to look at the echo from 1/30 and review and advise on that result.   Did confirm this with the pt and noted Echo was done on 06/24/21 and is visible in care everywhere.  Informed the pt that I will route this message to Dr. Percival Spanish and his covering RN, to make them aware that she had an echo done at [facility] on 06/24/21 (visible in care everywhere).  Will have them advise if they can use 1/30 echo results, and if so, to cancel her upcoming echo appt with our office on 2/15.   She is aware that once advisement is provided, she will get a call back shortly thereafter.  Pt verbalized understanding and agrees with this plan.

## 2021-07-05 NOTE — Telephone Encounter (Signed)
Paidyn, Mcferran - 07/05/2021 10:22 AM Minus Breeding, MD  Sent: Fri July 05, 2021  1:13 PM  To: Nuala Alpha, LPN          Message  Yes cancel the echo   Echo appt for 07/10/21 cancelled as okayed above per Dr. Percival Spanish.

## 2021-07-05 NOTE — Telephone Encounter (Signed)
Pt states shes been in and out of the hospital the past few weeks and believes that an echo has been completed.. pt would like to know if she needs to come on for appt to have this repeated or if shes ok to cancel... please advise

## 2021-07-08 ENCOUNTER — Other Ambulatory Visit (HOSPITAL_COMMUNITY): Payer: Self-pay

## 2021-07-10 ENCOUNTER — Other Ambulatory Visit (HOSPITAL_COMMUNITY): Payer: Medicare Other

## 2021-07-23 DIAGNOSIS — G935 Compression of brain: Secondary | ICD-10-CM | POA: Insufficient documentation

## 2021-07-29 ENCOUNTER — Other Ambulatory Visit (HOSPITAL_COMMUNITY): Payer: Self-pay

## 2021-08-09 ENCOUNTER — Other Ambulatory Visit: Payer: Self-pay

## 2021-08-09 ENCOUNTER — Encounter: Payer: Self-pay | Admitting: Endocrinology

## 2021-08-09 ENCOUNTER — Ambulatory Visit (INDEPENDENT_AMBULATORY_CARE_PROVIDER_SITE_OTHER): Payer: Medicare Other | Admitting: Endocrinology

## 2021-08-09 VITALS — BP 122/70 | HR 84 | Ht 64.0 in | Wt 180.0 lb

## 2021-08-09 DIAGNOSIS — E041 Nontoxic single thyroid nodule: Secondary | ICD-10-CM

## 2021-08-09 DIAGNOSIS — E0590 Thyrotoxicosis, unspecified without thyrotoxic crisis or storm: Secondary | ICD-10-CM | POA: Diagnosis not present

## 2021-08-09 LAB — T4, FREE: Free T4: 0.58 ng/dL — ABNORMAL LOW (ref 0.60–1.60)

## 2021-08-09 LAB — TSH: TSH: 0.02 u[IU]/mL — ABNORMAL LOW (ref 0.35–5.50)

## 2021-08-09 MED ORDER — METHIMAZOLE 10 MG PO TABS: 10.0000 mg | ORAL_TABLET | Freq: Two times a day (BID) | ORAL | 1 refills | Status: DC

## 2021-08-09 MED ORDER — METHIMAZOLE 10 MG PO TABS: 20.0000 mg | ORAL_TABLET | Freq: Two times a day (BID) | ORAL | 3 refills | Status: DC

## 2021-08-09 NOTE — Progress Notes (Signed)
? ?Subjective:  ? ? Patient ID: Karen Ferguson, female    DOB: 05-30-1955, 66 y.o.   MRN: [id number] ? ?HPI ?Pt returns for f/u of hyperthyroidism (dx'ed early 2023 (TSH was normal 1 year earlier); tapazole was chosen as initial rx, due to severity; she has been in [place], and [facility] ER.  Pt says she takes tapazole as rx'ed, but noncompliance was noted in hosp record).  She again reports intermitt palpitations.  She was noted to have MNG; Bx was requested 06/19/21, but pt has not been called to schedule.  Pt says she never misses tapazole.   ?Past Medical History:  ?Diagnosis Date  ? Arthritis   ? Asthma   ? Albuterol prn  ? Bronchitis   ? Degenerative disc disease   ? Depression   ? Diabetes mellitus without complication ([place])   ? Borderline diabetic in the past per pt  ? History of echocardiogram   ? Echo 1/18: EF 60-65, no RWMA, Gr 1 DD, PASP 20  ? Hypertension   ? PVC's (premature ventricular contractions)   ? Holter 12/17: NSR, occ PAC/PVCs, no AFib; one 4 beat run NSVT  ? ? ?Past Surgical History:  ?Procedure Laterality Date  ? ABDOMINAL HYSTERECTOMY  1988  ? for fibroid tumors  ? [place]  ? rt  ? CHOLECYSTECTOMY  1989  ? [place]  ? KNEE ARTHROSCOPY Right   ? TENDON REPAIR Right 03/03/2013  ? Procedure: RIGHT DEBRIDEMENT AND TENOLYSIS OF PERONEOUS LONGOUS AND BREVIS TENDONS ;  Surgeon: Wylene Simmer, MD;  Location: [place];  Service: Orthopedics;  Laterality: Right;  ? TOOTH EXTRACTION N/A 03/25/2019  ? Procedure: DENTAL EXTRACTIONS TEETH NUMBER THREE, FOUR, FIVE, SIX, SEVEN, NINE, TWELVE, FOURTEEN, THIRTY AND ALVEOLOPLASTY;  Surgeon: Diona Browner, DDS;  Location: [place];  Service: Oral Surgery;  Laterality: N/A;  ? ? ?Social History  ? ?Socioeconomic History  ? Marital status: Single  ?  Spouse name: Not on file  ? Number of children: 1  ? Years of education: Not on file  ? Highest education level: Not on file  ?Occupational History  ? Not on file  ?Tobacco Use   ? Smoking status: Never  ? Smokeless tobacco: Never  ?Vaping Use  ? Vaping Use: Never used  ?Substance and Sexual Activity  ? Alcohol use: Yes  ?  Comment: occasional  ? Drug use: No  ? Sexual activity: Not Currently  ?Other Topics Concern  ? Not on file  ?Social History Narrative  ? Works at [employer] ([employer]) in [employer].    ? Single  ? 1 daughter - passed away at age 36  ? Lives with her 2 grandchildren.  ? Right Handed  ? ?Social Determinants of Health  ? ?Financial Resource Strain: Not on file  ?Food Insecurity: Not on file  ?Transportation Needs: Not on file  ?Physical Activity: Not on file  ?Stress: Not on file  ?Social Connections: Not on file  ?Intimate Partner Violence: Not on file  ? ? ?Current Outpatient Medications on File Prior to Visit  ?Medication Sig Dispense Refill  ? acetaminophen (TYLENOL) 500 MG tablet Take 500 mg by mouth every 6 (six) hours as needed for mild pain.    ? albuterol (VENTOLIN HFA) 108 (90 Base) MCG/ACT inhaler INHALE 2 PUFFS INTO THE LUNGS FOUR TIMES DAILY AS NEEDED FOR SHORTNESS OF BREATH. 8.5 g 3  ? apixaban (ELIQUIS) 5 MG TABS tablet  Take 1 tablet (5 mg total) by mouth 2 (two) times daily. 60 tablet 2  ? diphenhydrAMINE (BENADRYL) 25 mg capsule Take 25 mg by mouth every 6 (six) hours as needed for itching.    ? linaclotide (LINZESS) 290 MCG CAPS capsule TAKE 1 CAPSULE BY MOUTH AT LEAST 30 MINUTES BEFORE THE FIRST MEAL OF THE DAY ON AN EMPTY STOMACH (Patient taking differently: Take 290 mcg by mouth daily before breakfast.) 90 capsule 3  ? ondansetron (ZOFRAN) 4 MG tablet Take 1 tablet (4 mg total) by mouth every 8 (eight) hours as needed for nausea or vomiting. 15 tablet 0  ? pantoprazole (PROTONIX) 40 MG tablet Take 1 tablet (40 mg total) by mouth daily. (Patient taking differently: Take 40 mg by mouth every morning.) 30 tablet 0  ? senna-docusate (SENOKOT-S) 8.6-50 MG tablet Take 1 tablet by mouth daily as needed for mild constipation. 30 tablet 1  ?  valsartan-hydrochlorothiazide (DIOVAN-HCT) 80-12.5 MG tablet TAKE 1 TABLET BY MOUTH DAILY (Patient taking differently: Take 1 tablet by mouth every morning.) 90 tablet 3  ? [DISCONTINUED] metoprolol tartrate (LOPRESSOR) 25 MG tablet Take 1 tablet (25 mg total) by mouth 2 (two) times daily. 60 tablet 2  ? ?No current facility-administered medications on file prior to visit.  ? ? ?Allergies  ?Allergen Reactions  ? Shellfish Allergy Hives  ? ? ?Family History  ?Problem Relation Age of Onset  ? Thyroid disease Mother   ? Arthritis Mother   ? Hypertension Mother   ? Diabetes Mother   ? Kidney disease Mother   ? Arrhythmia Mother   ?     AFib  ? Thyroid disease Sister   ? Arthritis Sister   ? Heart disease Sister   ? Hypertension Brother   ? Heart disease Maternal Grandfather   ? Heart attack Maternal Grandfather 45  ?     MI  ? Heart disease Maternal Aunt   ? Heart failure Maternal Aunt   ? Heart disease Maternal Uncle   ? Cancer Maternal Uncle   ? ? ?BP 122/70 (BP Location: Left Arm, Patient Position: Sitting, Cuff Size: Normal)   Pulse 84   Ht '5\' 4"'$  (1.626 m)   Wt 180 lb (81.6 kg)   SpO2 98%   BMI 30.90 kg/m?  ? ? ?Review of Systems ?Denies fever ?   ?Objective:  ? Physical Exam ?VITAL SIGNS:  See vs page ?GENERAL: no distress ?NECK: thyroid is 3-5 times normal size, with smooth surface.   ? ? ?Lab Results  ?Component Value Date  ? TSH 0.02 (L) 08/09/2021  ?Free T4=0.58 ?   ?Assessment & Plan:  ?Hyperthyroidism: overcontrolled.  I have sent a prescription to your pharmacy, to reduce tapazole to 10-BID.   ? ?

## 2021-08-09 NOTE — Patient Instructions (Addendum)
I have sent a prescription to your pharmacy, to increase methimazole to 2 pills, twice per day. ?Blood tests are requested for you today.  We'll let you know about the results.  ?If ever you have fever while taking methimazole, stop it and call us, even if the reason is obvious, because of the risk of a rare side-effect.  ?It is best to never miss the medication.  However, if you do miss it, next best is to double up the next time.   ?you will receive a phone call, about a day and time for an appointment, for the biopsy.   ?Please come back for a follow-up appointment in 1 month.   ? ?

## 2021-08-26 ENCOUNTER — Other Ambulatory Visit (HOSPITAL_COMMUNITY)
Admission: RE | Admit: 2021-08-26 | Discharge: 2021-08-26 | Disposition: A | Discharge status: Home / Self Care | Payer: Medicare Other | Source: Ambulatory Visit | Attending: Interventional Radiology | Admitting: Interventional Radiology

## 2021-08-26 ENCOUNTER — Ambulatory Visit
Admission: RE | Admit: 2021-08-26 | Discharge: 2021-08-26 | Disposition: A | Discharge status: Home / Self Care | Payer: Medicare Other | Source: Ambulatory Visit | Attending: Endocrinology | Admitting: Endocrinology

## 2021-08-26 DIAGNOSIS — D34 Benign neoplasm of thyroid gland: Secondary | ICD-10-CM | POA: Diagnosis not present

## 2021-08-26 DIAGNOSIS — E041 Nontoxic single thyroid nodule: Secondary | ICD-10-CM | POA: Diagnosis present

## 2021-08-27 LAB — CYTOLOGY - NON PAP

## 2021-09-05 ENCOUNTER — Other Ambulatory Visit (HOSPITAL_COMMUNITY): Payer: Self-pay

## 2021-09-10 ENCOUNTER — Ambulatory Visit (INDEPENDENT_AMBULATORY_CARE_PROVIDER_SITE_OTHER): Payer: Medicare Other | Admitting: Endocrinology

## 2021-09-10 VITALS — BP 118/70 | HR 73 | Ht 64.0 in | Wt 182.2 lb

## 2021-09-10 DIAGNOSIS — E0590 Thyrotoxicosis, unspecified without thyrotoxic crisis or storm: Secondary | ICD-10-CM | POA: Diagnosis not present

## 2021-09-10 LAB — TSH: TSH: 10.69 u[IU]/mL — ABNORMAL HIGH (ref 0.35–5.50)

## 2021-09-10 LAB — T4, FREE: Free T4: 0.51 ng/dL — ABNORMAL LOW (ref 0.60–1.60)

## 2021-09-10 MED ORDER — METHIMAZOLE 10 MG PO TABS
10.0000 mg | ORAL_TABLET | Freq: Every day | ORAL | 0 refills | Status: DC
  Filled 2021-09-10: qty 90, 90d supply, fill #0

## 2021-09-10 NOTE — Patient Instructions (Addendum)
Blood tests are requested for you today.  We'll let you know about the results.   ?If ever you have fever while taking methimazole, stop it and call us, even if the reason is obvious, because of the risk of a rare side-effect.  ?It is best to never miss the medication.  However, if you do miss it, next best is to double up the next time.   ?You should have an endocrinology follow-up appointment in 3 months.   ?

## 2021-09-10 NOTE — Progress Notes (Signed)
? ?Subjective:  ? ? Patient ID: Karen Ferguson, female    DOB: 10/12/1955, 66 y.o.   MRN: [id number] ? ?HPI ?Pt returns for f/u of hyperthyroidism/MNG (dx'ed early 2023 (TSH was normal 1 year earlier); tapazole was chosen as initial rx, due to severity; pt says she takes tapazole as rx'ed, but noncompliance has been noted in hosp record; bx 2023 was cat 2).  She again reports intermitt palpitations.  Pt says she never misses tapazole.  ?Past Medical History:  ?Diagnosis Date  ? Arthritis   ? Asthma   ? Albuterol prn  ? Bronchitis   ? Degenerative disc disease   ? Depression   ? Diabetes mellitus without complication ([place])   ? Borderline diabetic in the past per pt  ? History of echocardiogram   ? Echo 1/18: EF 60-65, no RWMA, Gr 1 DD, PASP 20  ? Hypertension   ? PVC's (premature ventricular contractions)   ? Holter 12/17: NSR, occ PAC/PVCs, no AFib; one 4 beat run NSVT  ? ? ?Past Surgical History:  ?Procedure Laterality Date  ? ABDOMINAL HYSTERECTOMY  1988  ? for fibroid tumors  ? [place]  ? rt  ? CHOLECYSTECTOMY  1989  ? [place]  ? KNEE ARTHROSCOPY Right   ? TENDON REPAIR Right 03/03/2013  ? Procedure: RIGHT DEBRIDEMENT AND TENOLYSIS OF PERONEOUS LONGOUS AND BREVIS TENDONS ;  Surgeon: Wylene Simmer, MD;  Location: [place];  Service: Orthopedics;  Laterality: Right;  ? TOOTH EXTRACTION N/A 03/25/2019  ? Procedure: DENTAL EXTRACTIONS TEETH NUMBER THREE, FOUR, FIVE, SIX, SEVEN, NINE, TWELVE, FOURTEEN, THIRTY AND ALVEOLOPLASTY;  Surgeon: Diona Browner, DDS;  Location: [place];  Service: Oral Surgery;  Laterality: N/A;  ? ? ?Social History  ? ?Socioeconomic History  ? Marital status: Single  ?  Spouse name: Not on file  ? Number of children: 1  ? Years of education: Not on file  ? Highest education level: Not on file  ?Occupational History  ? Not on file  ?Tobacco Use  ? Smoking status: Never  ? Smokeless tobacco: Never  ?Vaping Use  ? Vaping Use: Never used  ?Substance and  Sexual Activity  ? Alcohol use: Yes  ?  Comment: occasional  ? Drug use: No  ? Sexual activity: Not Currently  ?Other Topics Concern  ? Not on file  ?Social History Narrative  ? Works at [employer] ([employer]) in [employer].    ? Single  ? 1 daughter - passed away at age 7  ? Lives with her 2 grandchildren.  ? Right Handed  ? ?Social Determinants of Health  ? ?Financial Resource Strain: Not on file  ?Food Insecurity: Not on file  ?Transportation Needs: Not on file  ?Physical Activity: Not on file  ?Stress: Not on file  ?Social Connections: Not on file  ?Intimate Partner Violence: Not on file  ? ? ?Current Outpatient Medications on File Prior to Visit  ?Medication Sig Dispense Refill  ? acetaminophen (TYLENOL) 500 MG tablet Take 500 mg by mouth every 6 (six) hours as needed for mild pain.    ? albuterol (VENTOLIN HFA) 108 (90 Base) MCG/ACT inhaler INHALE 2 PUFFS INTO THE LUNGS FOUR TIMES DAILY AS NEEDED FOR SHORTNESS OF BREATH. 8.5 g 3  ? apixaban (ELIQUIS) 5 MG TABS tablet Take 1 tablet (5 mg total) by mouth 2 (two) times daily. 60 tablet 2  ? diphenhydrAMINE (BENADRYL) 25 mg capsule Take 25  mg by mouth every 6 (six) hours as needed for itching.    ? linaclotide (LINZESS) 290 MCG CAPS capsule TAKE 1 CAPSULE BY MOUTH AT LEAST 30 MINUTES BEFORE THE FIRST MEAL OF THE DAY ON AN EMPTY STOMACH (Patient taking differently: Take 290 mcg by mouth daily before breakfast.) 90 capsule 3  ? ondansetron (ZOFRAN) 4 MG tablet Take 1 tablet (4 mg total) by mouth every 8 (eight) hours as needed for nausea or vomiting. 15 tablet 0  ? pantoprazole (PROTONIX) 40 MG tablet Take 1 tablet (40 mg total) by mouth daily. (Patient taking differently: Take 40 mg by mouth every morning.) 30 tablet 0  ? senna-docusate (SENOKOT-S) 8.6-50 MG tablet Take 1 tablet by mouth daily as needed for mild constipation. 30 tablet 1  ? amLODipine (NORVASC) 10 MG tablet Take 10 mg by mouth daily.    ? valsartan-hydrochlorothiazide (DIOVAN-HCT) 80-12.5 MG  tablet TAKE 1 TABLET BY MOUTH DAILY (Patient taking differently: Take 1 tablet by mouth every morning.) 90 tablet 3  ? [DISCONTINUED] metoprolol tartrate (LOPRESSOR) 25 MG tablet Take 1 tablet (25 mg total) by mouth 2 (two) times daily. 60 tablet 2  ? ?No current facility-administered medications on file prior to visit.  ? ? ?Allergies  ?Allergen Reactions  ? Shellfish Allergy Hives  ? ? ?Family History  ?Problem Relation Age of Onset  ? Thyroid disease Mother   ? Arthritis Mother   ? Hypertension Mother   ? Diabetes Mother   ? Kidney disease Mother   ? Arrhythmia Mother   ?     AFib  ? Thyroid disease Sister   ? Arthritis Sister   ? Heart disease Sister   ? Hypertension Brother   ? Heart disease Maternal Grandfather   ? Heart attack Maternal Grandfather 45  ?     MI  ? Heart disease Maternal Aunt   ? Heart failure Maternal Aunt   ? Heart disease Maternal Uncle   ? Cancer Maternal Uncle   ? ? ?BP 118/70 (BP Location: Left Arm, Patient Position: Sitting, Cuff Size: Normal)   Pulse 73   Ht '5\' 4"'$  (1.626 m)   Wt 182 lb 3.2 oz (82.6 kg)   SpO2 99%   BMI 31.27 kg/m?  ? ? ?Review of Systems ?Denies fever.   ?   ?Objective:  ? Physical Exam ?VITAL SIGNS:  See vs page ?GENERAL: no distress ?NECK: thyroid is 5 times normal size, with MNG surface.   ? ? ?Lab Results  ?Component Value Date  ? TSH 10.69 (H) 09/10/2021  ? ?   ?Assessment & Plan:  ?Hypothyroidism.  Overcontrolled.  I have sent a prescription to your pharmacy, to reduce the methimazole ? ?

## 2021-09-11 ENCOUNTER — Other Ambulatory Visit (HOSPITAL_COMMUNITY): Payer: Self-pay

## 2021-09-19 ENCOUNTER — Other Ambulatory Visit: Payer: Self-pay | Admitting: General Surgery

## 2021-09-19 DIAGNOSIS — R2242 Localized swelling, mass and lump, left lower limb: Secondary | ICD-10-CM

## 2021-10-18 ENCOUNTER — Ambulatory Visit: Payer: Medicare Other | Admitting: Physician Assistant

## 2021-10-18 ENCOUNTER — Telehealth: Payer: Self-pay | Admitting: Endocrinology

## 2021-10-18 DIAGNOSIS — E0590 Thyrotoxicosis, unspecified without thyrotoxic crisis or storm: Secondary | ICD-10-CM

## 2021-10-18 MED ORDER — METHIMAZOLE 10 MG PO TABS: 10.0000 mg | ORAL_TABLET | Freq: Every day | ORAL | 0 refills | Status: DC

## 2021-10-18 NOTE — Telephone Encounter (Signed)
Patient called to request that her last Methimazole be sent to the [facility] on [facility] not [facility].

## 2021-10-18 NOTE — Telephone Encounter (Signed)
RX has now been sent to preferred pharmacy 

## 2021-10-30 NOTE — Progress Notes (Addendum)
Office Visit    Patient Name: Karen Ferguson Date of Encounter: 10/31/2021  Primary Care Provider:  Myrtie Neither, PA-C Primary Cardiologist:  Minus Breeding, MD Primary Electrophysiologist: None  Chief Complaint    Karen Ferguson is a 66 y.o. female with PMH of HTN, OSA, atrial fibrillation on Eliquis, hypothyroidism, thyroid nodule, obesity.  Past Medical History    Past Medical History:  Diagnosis Date   Arthritis    Asthma    Albuterol prn   Bronchitis    Degenerative disc disease    Depression    Diabetes mellitus without complication ([place])    Borderline diabetic in the past per pt   History of echocardiogram    Echo 1/18: EF 60-65, no RWMA, Gr 1 DD, PASP 20   Hypertension    PVC's (premature ventricular contractions)    Holter 12/17: NSR, occ PAC/PVCs, no AFib; one 4 beat run NSVT   Past Surgical History:  Procedure Laterality Date   ABDOMINAL HYSTERECTOMY  1988   for fibroid tumors   CARPAL TUNNEL RELEASE  1985   rt   [place] ARTHROSCOPY Right    TENDON REPAIR Right 03/03/2013   Procedure: RIGHT DEBRIDEMENT AND TENOLYSIS OF PERONEOUS LONGOUS AND BREVIS TENDONS ;  Surgeon: Wylene Simmer, MD;  Location: [facility];  Service: Orthopedics;  Laterality: Right;   TOOTH EXTRACTION N/A 03/25/2019   Procedure: DENTAL EXTRACTIONS TEETH NUMBER THREE, FOUR, FIVE, SIX, SEVEN, NINE, TWELVE, FOURTEEN, THIRTY AND ALVEOLOPLASTY;  Surgeon: Diona Browner, DDS;  Location: [facility];  Service: Oral Surgery;  Laterality: N/A;    Allergies  Allergies  Allergen Reactions   Shellfish Allergy Hives    History of Present Illness    Karen Ferguson is a 66 year old female with the above-mentioned past medical history who presents today for 4-week follow-up for 2D echo.  She was seen by Richardson Dopp, PA originally and December 2017 for complaint of palpitations.  She also noted significant fatigue and dyspnea requiring 3 pillows  to sleep at night.  She wore Holter monitor for 48 hours and also had 2D echo performed and also carotid ultrasound.  Her Holter revealed overall normal study with no A-fib or pauses and one 4 beat run of NSVT.  The 2D echo completed and revealed EF of 60-65%, with no RWMA and grade 1 D. There was no mitral valve regurgitation noted.  She also had carotid ultrasounds performed that revealed 1-39% stenosis on the right and left ICA.     She was lost to follow-up in cardiology until recently seen in January 2023 by Dr. Percival Spanish following hospitalization for new onset atrial fibrillation.  She was admitted with complaint of abdominal pain and was found to have acute kidney injury secondary to poor p.o. intake.  She was started on metoprolol and Eliquis prior to discharge.  Patient was not taking her medications as reported and was advised to continue beta-blocker and Eliquis with a goal to discontinue once thyroid normalized. She was noted to have a systolic murmur and shortness of breath which required 2D echo for evaluation.  Echo was completed in [place] in January 2023 that revealed EF 66% with normal LV function and no LVH present.  Since last being seen in the office patient reports that she has been doing well and is maintaining a better p.o. intake.  She is sinus rhythm today and denies any complaints of palpitations.  She has been experiencing occasional bouts of dizziness and presyncopal symptoms.  She also has complaints of visual changes such as spots and blurriness.  She is currently being seen by endocrinologist for her thyroid.  She has carotid bruit located on the left side and previous carotid ultrasounds completed in 2019 revealed mild stenosis. Ultrasounds patient denies chest pain, palpitations, dyspnea, PND, orthopnea, nausea, vomiting, dizziness, syncope, edema, weight gain, or early satiety.  Home Medications    Current Outpatient Medications  Medication Sig Dispense Refill    acetaminophen (TYLENOL) 500 MG tablet Take 500 mg by mouth every 6 (six) hours as needed for mild pain.     albuterol (VENTOLIN HFA) 108 (90 Base) MCG/ACT inhaler INHALE 2 PUFFS INTO THE LUNGS FOUR TIMES DAILY AS NEEDED FOR SHORTNESS OF BREATH. 8.5 g 3   amLODipine (NORVASC) 10 MG tablet Take 10 mg by mouth daily.     apixaban (ELIQUIS) 5 MG TABS tablet Take 1 tablet (5 mg total) by mouth 2 (two) times daily. 60 tablet 2   diphenhydrAMINE (BENADRYL) 25 mg capsule Take 25 mg by mouth every 6 (six) hours as needed for itching.     methimazole (TAPAZOLE) 10 MG tablet Take 1 tablet (10 mg total) by mouth daily. 90 tablet 0   ondansetron (ZOFRAN) 4 MG tablet Take 1 tablet (4 mg total) by mouth every 8 (eight) hours as needed for nausea or vomiting. 15 tablet 0   pantoprazole (PROTONIX) 40 MG tablet Take 1 tablet (40 mg total) by mouth daily. (Patient taking differently: Take 40 mg by mouth every morning.) 30 tablet 0   senna-docusate (SENOKOT-S) 8.6-50 MG tablet Take 1 tablet by mouth daily as needed for mild constipation. 30 tablet 1   No current facility-administered medications for this visit.     Review of Systems  Please see the history of present illness.    (+) Unsteady gait (+) Presyncopal episodes  All other systems reviewed and are otherwise negative except as noted above.  Physical Exam    Wt Readings from Last 3 Encounters:  10/31/21 191 lb 6.4 oz (86.8 kg)  09/10/21 182 lb 3.2 oz (82.6 kg)  08/09/21 180 lb (81.6 kg)   VS: Vitals:   10/31/21 1100  BP: 120/74  Pulse: 66  SpO2: 95%  ,Body mass index is 32.85 kg/m.  Constitutional:      Appearance: Healthy appearance. Not in distress.  Neck:     Vascular: JVD normal.  Pulmonary:     Effort: Pulmonary effort is normal.     Breath sounds: No wheezing. No rales. Diminished in the bases Cardiovascular:     Normal rate. Regular rhythm. Normal S1. Normal S2.  Bruit present and left carotid. Murmurs: There is no murmur.   Edema:    Peripheral edema absent.  Abdominal:     Palpations: Abdomen is soft non tender. There is no hepatomegaly.  Skin:    General: Skin is warm and dry.  Neurological:     General: No focal deficit present.     Mental Status: Alert and oriented to person, place and time.     Cranial Nerves: Cranial nerves are intact.  EKG/LABS/Other Studies Reviewed    ECG personally reviewed by me today -normal sinus rhythm with no acute changes and rate of 66  Risk Assessment/Calculations:    CHA2DS2-VASc Score = 4   This indicates a 4.8% annual risk of stroke. The patient's score is based upon: CHF History: 0 HTN History: 1 Diabetes  History: 1 Stroke History: 0 Vascular Disease History: 0 Age Score: 1 Gender Score: 1     Lab Results  Component Value Date   WBC 5.9 06/23/2021   HGB 10.0 (L) 06/23/2021   HCT 29.7 (L) 06/23/2021   MCV 89.2 06/23/2021   PLT 203 06/23/2021   Lab Results  Component Value Date   CREATININE 0.96 06/23/2021   BUN 15 06/23/2021   NA 133 (L) 06/23/2021   K 3.3 (L) 06/23/2021   CL 100 06/23/2021   CO2 22 06/23/2021   Lab Results  Component Value Date   ALT 29 06/23/2021   AST 31 06/23/2021   ALKPHOS 79 06/23/2021   BILITOT 0.7 06/23/2021   Lab Results  Component Value Date   CHOL 164 01/10/2015   HDL 47 01/10/2015   LDLCALC 97 01/10/2015   TRIG 49 05/27/2021   CHOLHDL 3.5 01/10/2015    Lab Results  Component Value Date   HGBA1C 5.5 03/22/2019    Assessment & Plan    1.  Atrial fibrillation: -2D echo completed with EF of 60-65% -Patient with no complaints of palpitations chest pain -EKG today with sinus rhythm rate of 66 -Continue Eliquis 5 mg twice daily  2.  Presyncope: -Patient reports dizziness with ambulation and sensation of falling with loss of balance -We will have her wear a 14-day ZIO monitor to evaluate for arrhythmia and source of presyncope  3.  Hyperthyroidism: -Currently followed by  endocrinologist -Methimazole was recently reduced for better control  4.  Thyroid nodule: -Currently followed by endocrinologist with biopsy completed last month  Disposition: Follow-up with Minus Breeding, MD or APP in 1 months    Medication Adjustments/Labs and Tests Ordered: Current medicines are reviewed at length with the patient today.  Concerns regarding medicines are outlined above.   Signed, Mable Fill, Marissa Nestle, NP 10/31/2021, 12:15 PM [place]

## 2021-10-31 ENCOUNTER — Ambulatory Visit (INDEPENDENT_AMBULATORY_CARE_PROVIDER_SITE_OTHER): Payer: Medicare Other | Admitting: Nurse Practitioner

## 2021-10-31 ENCOUNTER — Ambulatory Visit (INDEPENDENT_AMBULATORY_CARE_PROVIDER_SITE_OTHER): Payer: Medicare Other

## 2021-10-31 ENCOUNTER — Encounter: Payer: Self-pay | Admitting: Physician Assistant

## 2021-10-31 VITALS — BP 120/74 | HR 66 | Ht 64.0 in | Wt 191.4 lb

## 2021-10-31 DIAGNOSIS — I10 Essential (primary) hypertension: Secondary | ICD-10-CM | POA: Diagnosis not present

## 2021-10-31 DIAGNOSIS — E041 Nontoxic single thyroid nodule: Secondary | ICD-10-CM | POA: Diagnosis not present

## 2021-10-31 DIAGNOSIS — R55 Syncope and collapse: Secondary | ICD-10-CM

## 2021-10-31 DIAGNOSIS — I480 Paroxysmal atrial fibrillation: Secondary | ICD-10-CM

## 2021-10-31 DIAGNOSIS — R42 Dizziness and giddiness: Secondary | ICD-10-CM

## 2021-10-31 NOTE — Progress Notes (Unsigned)
Enrolled for [employer] to mail a ZIO XT long term holter monitor to the patients address on file.   Dr. Hochrein to read. 

## 2021-10-31 NOTE — Patient Instructions (Signed)
Medication Instructions:  Your physician recommends that you continue on your current medications as directed. Please refer to the Current Medication list given to you today.  *If you need a refill on your cardiac medications before your next appointment, please call your pharmacy*  Lab Work: NONE ordered at this time of appointment   If you have labs (blood work) drawn today and your tests are completely normal, you will receive your results only by: [facility] (if you have MyChart) OR A paper copy in the mail If you have any lab test that is abnormal or we need to change your treatment, we will call you to review the results.  Testing/Procedures:  Bryn Gulling- Long Term Monitor Instructions  Your physician has requested you wear a ZIO patch monitor for 14 days.  This is a single patch monitor. [employer] supplies one patch monitor per enrollment. Additional stickers are not available. Please do not apply patch if you will be having a Nuclear Stress Test,  Echocardiogram, Cardiac CT, MRI, or Chest Xray during the period you would be wearing the  monitor. The patch cannot be worn during these tests. You cannot remove and re-apply the  ZIO XT patch monitor.  Your ZIO patch monitor will be mailed 3 day USPS to your address on file. It may take 3-5 days  to receive your monitor after you have been enrolled.  Once you have received your monitor, please review the enclosed instructions. Your monitor  has already been registered assigning a specific monitor serial # to you.  Billing and Patient Assistance Program Information  We have supplied [employer] with any of your insurance information on file for billing purposes. [employer] offers a sliding scale Patient Assistance Program for patients that do not have  insurance, or whose insurance does not completely cover the cost of the ZIO monitor.  You must apply for the Patient Assistance Program to qualify for this discounted rate.  To apply,  please call [employer] at [phone], select option 4, select option 2, ask to apply for  Patient Assistance Program. Theodore Demark will ask your household income, and how many people  are in your household. They will quote your out-of-pocket cost based on that information.  [employer] will also be able to set up a 67-month interest-free payment plan if needed.  Applying the monitor   Shave hair from upper left chest.  Hold abrader disc by orange tab. Rub abrader in 40 strokes over the upper left chest as  indicated in your monitor instructions.  Clean area with 4 enclosed alcohol pads. Let dry.  Apply patch as indicated in monitor instructions. Patch will be placed under collarbone on left  side of chest with arrow pointing upward.  Rub patch adhesive wings for 2 minutes. Remove white label marked "1". Remove the white  label marked "2". Rub patch adhesive wings for 2 additional minutes.  While looking in a mirror, press and release button in center of patch. A small green light will  flash 3-4 times. This will be your only indicator that the monitor has been turned on.  Do not shower for the first 24 hours. You may shower after the first 24 hours.  Press the button if you feel a symptom. You will hear a small click. Record Date, Time and  Symptom in the Patient Logbook.  When you are ready to remove the patch, follow instructions on the last 2 pages of Patient  Logbook. Stick patch monitor onto the last page of  Patient Logbook.  Place Patient Logbook in the blue and white box. Use locking tab on box and tape box closed  securely. The blue and white box has prepaid postage on it. Please place it in the mailbox as  soon as possible. Your physician should have your test results approximately 7 days after the  monitor has been mailed back to [facility].  Call [facility] at [phone] if you have questions regarding  your ZIO XT patch monitor. Call them immediately if you see  an orange light blinking on your  monitor.  If your monitor falls off in less than 4 days, contact our Monitor department at [phone].  If your monitor becomes loose or falls off after 4 days call [employer] at [phone] for  suggestions on securing your monitor  Follow-Up: At [facility], you and your health needs are our priority.  As part of our continuing mission to provide you with exceptional heart care, we have created designated Provider Care Teams.  These Care Teams include your primary Cardiologist (physician) and Advanced Practice Providers (APPs -  Physician Assistants and Nurse Practitioners) who all work together to provide you with the care you need, when you need it.   Your next appointment:   5-6 week(s)  The format for your next appointment:   In Person  Provider:   Minus Breeding, MD  or  APP         Other Instructions   Important Information About [place]

## 2021-11-30 DIAGNOSIS — R011 Cardiac murmur, unspecified: Secondary | ICD-10-CM | POA: Insufficient documentation

## 2021-11-30 DIAGNOSIS — N182 Chronic kidney disease, stage 2 (mild): Secondary | ICD-10-CM | POA: Insufficient documentation

## 2021-11-30 NOTE — Progress Notes (Deleted)
Cardiology Office Note   Date:  11/30/2021   ID:  [name], Karen Ferguson, MRN [id number]  PCP:  Myrtie Neither, PA-C  Cardiologist:   Minus Breeding, MD   No chief complaint on file.     History of Present Illness: Karen Ferguson is a 66 y.o. female who presents for evaluation of atrial fibrillation.  ***   ***   The patient was discharged from the hospital two days ago.  She was admitted with abdominal pain and she had AKI.  She was hydrated.  She is hyperthyroid but has not been taking her Tapazole.  TSH was very low this admisison.  She was in atrial fib and rate was controlled with beta blocker.  She has been treated with Eliquis.       She actually presented at the beginning of January with abdominal discomfort that is when she was found to have thyroid abnormality and atrial fibrillation.  She came back a second time with abdominal discomfort not taking her meds.  As far as I can see she was in sinus rhythm at the time of discharge.  She says she is felt her heart racing and skipping for years.  She has a sensation of sometimes it beating in her head.  She feels some headaches.  She has had PVCs documented before.  She has some rare dizziness.  However, its not clear that she has any symptoms consistent with sustained tachyarrhythmias.  She sometimes gets short of breath walking but she can do her own grocery shopping and driving and she lives independently.  She does not describe chest pressure routinely, neck or arm discomfort.  She chronically sleeps on 3-4 pillows.  She did have an echo in 2018 with some diastolic dysfunction.      Past Medical History:  Diagnosis Date   Arthritis    Asthma    Albuterol prn   Bronchitis    Degenerative disc disease    Depression    Diabetes mellitus without complication ([place])    Borderline diabetic in the past per pt   History of echocardiogram    Echo 1/18: EF 60-65, no RWMA, Gr 1 DD, PASP 20   Hypertension    PVC's  (premature ventricular contractions)    Holter 12/17: NSR, occ PAC/PVCs, no AFib; one 4 beat run NSVT    Past Surgical History:  Procedure Laterality Date   ABDOMINAL HYSTERECTOMY  1988   for fibroid tumors   CARPAL TUNNEL RELEASE  1985   rt   [place] ARTHROSCOPY Right    TENDON REPAIR Right 03/03/2013   Procedure: RIGHT DEBRIDEMENT AND TENOLYSIS OF PERONEOUS LONGOUS AND BREVIS TENDONS ;  Surgeon: Wylene Simmer, MD;  Location: [facility];  Service: Orthopedics;  Laterality: Right;   TOOTH EXTRACTION N/A 03/25/2019   Procedure: DENTAL EXTRACTIONS TEETH NUMBER THREE, FOUR, FIVE, SIX, SEVEN, NINE, TWELVE, FOURTEEN, THIRTY AND ALVEOLOPLASTY;  Surgeon: Diona Browner, DDS;  Location: [facility];  Service: Oral Surgery;  Laterality: N/A;     Current Outpatient Medications  Medication Sig Dispense Refill   acetaminophen (TYLENOL) 500 MG tablet Take 500 mg by mouth every 6 (six) hours as needed for mild pain.     albuterol (VENTOLIN HFA) 108 (90 Base) MCG/ACT inhaler INHALE 2 PUFFS INTO THE LUNGS FOUR TIMES DAILY AS NEEDED FOR SHORTNESS OF BREATH. 8.5 g 3   amLODipine (NORVASC) 10 MG tablet  Take 10 mg by mouth daily.     apixaban (ELIQUIS) 5 MG TABS tablet Take 1 tablet (5 mg total) by mouth 2 (two) times daily. 60 tablet 2   diphenhydrAMINE (BENADRYL) 25 mg capsule Take 25 mg by mouth every 6 (six) hours as needed for itching.     methimazole (TAPAZOLE) 10 MG tablet Take 1 tablet (10 mg total) by mouth daily. 90 tablet 0   ondansetron (ZOFRAN) 4 MG tablet Take 1 tablet (4 mg total) by mouth every 8 (eight) hours as needed for nausea or vomiting. 15 tablet 0   pantoprazole (PROTONIX) 40 MG tablet Take 1 tablet (40 mg total) by mouth daily. (Patient taking differently: Take 40 mg by mouth every morning.) 30 tablet 0   senna-docusate (SENOKOT-S) 8.6-50 MG tablet Take 1 tablet by mouth daily as needed for mild constipation. 30 tablet 1   No  current facility-administered medications for this visit.    Allergies:   Shellfish allergy  \  ROS:  Please see the history of present illness.   Otherwise, review of systems are positive for ***.   All other systems are reviewed and negative.    PHYSICAL EXAM: VS:  There were no vitals taken for this visit. , BMI There is no height or weight on file to calculate BMI. GENERAL:  Well appearing NECK:  No jugular venous distention, waveform within normal limits, carotid upstroke brisk and symmetric, no bruits, no thyromegaly LUNGS:  Clear to auscultation bilaterally CHEST:  Unremarkable HEART:  PMI not displaced or sustained,S1 and S2 within normal limits, no S3, no S4, no clicks, no rubs, *** murmurs ABD:  Flat, positive bowel sounds normal in frequency in pitch, no bruits, no rebound, no guarding, no midline pulsatile mass, no hepatomegaly, no splenomegaly EXT:  2 plus pulses throughout, no edema, no cyanosis no clubbing     ***GENERAL:  Well appearing HEENT:  Pupils equal round and reactive, fundi not visualized, oral mucosa unremarkable NECK:  No jugular venous distention, waveform within normal limits, carotid upstroke brisk and symmetric, no bruits, no thyromegaly LYMPHATICS:  No cervical, inguinal adenopathy LUNGS:  Clear to auscultation bilaterally BACK:  No CVA tenderness CHEST:  Unremarkable HEART:  PMI not displaced or sustained,S1 and S2 within normal limits, no S3, no S4, no clicks, no rubs, grade 2 out of 6 apical and mid right sternal border systolic murmur, no diastolic murmurs ABD:  Flat, positive bowel sounds normal in frequency in pitch, no bruits, no rebound, no guarding, no midline pulsatile mass, no hepatomegaly, no splenomegaly EXT:  2 plus pulses throughout, no edema, no cyanosis no clubbing SKIN:  No rashes no nodules NEURO:  Cranial nerves II through XII grossly intact, motor grossly intact throughout PSYCH:  Cognitively intact, oriented to person place and  time    EKG:  EKG is *** ordered today. The ekg ordered *** demonstrates sinus rhythm, rate ***, axis within normal limits, intervals within normal limits, no acute ST-T wave changes.   Recent Labs: 06/18/2021: Magnesium 1.8 06/23/2021: ALT 29; BUN 15; Creatinine, Ser 0.96; Hemoglobin 10.0; Platelets 203; Potassium 3.3; Sodium 133 09/10/2021: TSH 10.69    Lipid Panel    Component Value Date/Time   CHOL 164 01/10/2015 1009   TRIG 49 05/27/2021 0300   HDL 47 01/10/2015 1009   CHOLHDL 3.5 01/10/2015 1009   VLDL 20 01/10/2015 1009   LDLCALC 97 01/10/2015 1009      Wt Readings from Last 3 Encounters:  10/31/21 191  lb 6.4 oz (86.8 kg)  09/10/21 182 lb 3.2 oz (82.6 kg)  08/09/21 180 lb (81.6 kg)      Other studies Reviewed: Additional studies/ records that were reviewed today include: *** Review of the above records demonstrates:  Please see elsewhere in the note.     ASSESSMENT AND PLAN:  ATRIAL FIB:   ***  The patient has paroxysmal atrial fibrillation probably related to her thyroid abnormalities.  She is not taking her medications.  It is reasonable to continue the beta-blocker and the Eliquis so I might be able to discontinue this in several months once her thyroid is normalized.  HYPERTHYROIDISM:     ***  She is following with endocrinology.  THYROID NODULE:  ***  She reports she is to have a biopsy.  CKD II: Her creatinine is *** has been mildly elevated and can be followed.  MURMUR:    She was to have an echo.  ***  The patient has a systolic murmur and is describing some shortness of breath.  I will order an echocardiogram.   Current medicines are reviewed at length with the patient today  The patient does not have concerns regarding medicines.  The following changes have been made:  no change  Labs/ tests ordered today include:   No orders of the defined types were placed in this encounter.    Disposition:   FU with me or APP in 4  months.   Signed, Minus Breeding, MD  11/30/2021 12:53 PM    [facility]

## 2021-12-02 ENCOUNTER — Ambulatory Visit: Payer: Medicare Other | Admitting: Cardiology

## 2021-12-02 DIAGNOSIS — I480 Paroxysmal atrial fibrillation: Secondary | ICD-10-CM

## 2021-12-02 DIAGNOSIS — R011 Cardiac murmur, unspecified: Secondary | ICD-10-CM

## 2021-12-02 DIAGNOSIS — N182 Chronic kidney disease, stage 2 (mild): Secondary | ICD-10-CM

## 2021-12-02 DIAGNOSIS — E0590 Thyrotoxicosis, unspecified without thyrotoxic crisis or storm: Secondary | ICD-10-CM

## 2021-12-02 DIAGNOSIS — E041 Nontoxic single thyroid nodule: Secondary | ICD-10-CM

## 2021-12-04 ENCOUNTER — Telehealth: Payer: Self-pay

## 2021-12-04 NOTE — Telephone Encounter (Signed)
She is currently undergoing cardiac monitoring for presyncope.  Would recommend that we await monitor results before providing general cardiac clearance.  Request has been routed to pharmacy team for advisement on holding Eliquis.   Emmaline Life, NP-C    12/04/2021, 12:18 PM [facility] [address], [address] Office [phone] Fax [phone]

## 2021-12-04 NOTE — Telephone Encounter (Signed)
   Pre-operative Risk Assessment    Patient Name: Karen Ferguson  DOB: 1956-04-08 MRN: [id number]      Request for Surgical Clearance    Procedure:   Excision of Left Hip Mass  Date of Surgery:  Clearance TBD                                 Surgeon:  Stark Klein, MD Surgeon's Group or Practice Name:  [facility] Phone number:  [phone] Fax number:  [phone]   Type of Clearance Requested:   - Pharmacy:  Hold Apixaban (Eliquis)     Type of Anesthesia:  General    Additional requests/questions:   We need instructions as to how the patient should HOLD medication preoperatively. Please call to advise if this patient will require an office visit or further medical work-up before clearance can be given.  Signed, Elsie Lincoln Delancey Moraes   12/04/2021, 8:37 AM

## 2021-12-05 NOTE — Telephone Encounter (Signed)
Patient with diagnosis of afib on Eliquis for anticoagulation.    Procedure: excision of left hip mass Date of procedure: TBD  CHA2DS2-VASc Score = 4  This indicates a 4.8% annual risk of stroke. The patient's score is based upon: CHF History: 0 HTN History: 1 Diabetes History: 0 Stroke History: 0 Vascular Disease History: 1 Age Score: 1 Gender Score: 1   CrCl 69m/min using adjusted body weight due to obesity Platelet count 229K  Per office protocol, patient can hold Eliquis for 2 days prior to procedure.    **This guidance is not considered finalized until pre-operative APP has relayed final recommendations.**

## 2021-12-20 ENCOUNTER — Encounter (HOSPITAL_COMMUNITY): Payer: Self-pay | Admitting: Emergency Medicine

## 2021-12-20 ENCOUNTER — Ambulatory Visit (HOSPITAL_COMMUNITY)
Admission: EM | Admit: 2021-12-20 | Discharge: 2021-12-20 | Disposition: A | Discharge status: Home / Self Care | Payer: Medicare Other | Attending: Internal Medicine | Admitting: Internal Medicine

## 2021-12-20 DIAGNOSIS — R350 Frequency of micturition: Secondary | ICD-10-CM | POA: Diagnosis present

## 2021-12-20 DIAGNOSIS — N390 Urinary tract infection, site not specified: Secondary | ICD-10-CM | POA: Diagnosis present

## 2021-12-20 LAB — POCT URINALYSIS DIPSTICK, ED / UC
Bilirubin Urine: NEGATIVE
Glucose, UA: NEGATIVE mg/dL
Nitrite: NEGATIVE
Protein, ur: 100 mg/dL — AB
Specific Gravity, Urine: 1.025 (ref 1.005–1.030)
Urobilinogen, UA: 0.2 mg/dL (ref 0.0–1.0)
pH: 5.5 (ref 5.0–8.0)

## 2021-12-20 MED ORDER — CEPHALEXIN 500 MG PO CAPS: 500.0000 mg | ORAL_CAPSULE | Freq: Four times a day (QID) | ORAL | 0 refills | Status: AC

## 2021-12-20 NOTE — ED Provider Notes (Signed)
[place]    CSN: [id number] Arrival date & time: 12/20/21  1352      History   Chief Complaint Chief Complaint  Patient presents with   Urinary Frequency    HPI Karen Ferguson is a 66 y.o. female.   Patient presents with urinary frequency that has been present for a few days.  Denies urinary burning, vaginal discharge, abdominal pain, pelvic pain, fever, hematuria.  She does report some lower back pain but reports this is baseline given that she has degenerative disc disease.   Urinary Frequency    Past Medical History:  Diagnosis Date   Arthritis    Asthma    Albuterol prn   Bronchitis    Degenerative disc disease    Depression    Diabetes mellitus without complication ([place])    Borderline diabetic in the past per pt   History of echocardiogram    Echo 1/18: EF 60-65, no RWMA, Gr 1 DD, PASP 20   Hypertension    PVC's (premature ventricular contractions)    Holter 12/17: NSR, occ PAC/PVCs, no AFib; one 4 beat run NSVT    Patient Active Problem List   Diagnosis Date Noted   Murmur 11/30/2021   CKD (chronic kidney disease), stage II 11/30/2021   PAF (paroxysmal atrial fibrillation) ([place]) 06/19/2021   Hyperthyroidism    Nausea & vomiting    Thyroid nodule greater than or equal to 1 cm in diameter incidentally noted on imaging study    AKI (acute kidney injury) ([place]) 05/27/2021   Pancreatitis 05/27/2021   COVID-19 virus detected 05/27/2021   Atrial fibrillation with RVR ([place]) 05/27/2021   Abnormal TSH 05/27/2021   Chronic abdominal pain    Atrial fibrillation with rapid ventricular response ([place]) 05/26/2021   Palpitations 04/01/2016   Family history of premature coronary heart disease 04/01/2016   Obstructive sleep apnea 12/28/2015   Bilateral leg numbness 07/03/2015   GERD (gastroesophageal reflux disease) 07/03/2015   Constipation 07/03/2015   Dizziness and giddiness 01/10/2015   Foot pain 01/10/2013   Tendon tear, ankle 01/10/2013   Cyst  and pseudocyst of pancreas 03/16/2012   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   Degenerative disc disease    Asthma 06/10/2011   Obesity 10/17/2010   DENTAL CARIES 12/15/2008   OSTEOARTHRITIS, GENERALIZED, MULTIPLE JOINTS 12/15/2008   Essential hypertension 09/18/2007   OVERACTIVE BLADDER 04/29/2007   RECTAL BLEEDING 09/23/2006    Past Surgical History:  Procedure Laterality Date   ABDOMINAL HYSTERECTOMY  1988   for fibroid tumors   CARPAL TUNNEL RELEASE  1985   rt   [place] ARTHROSCOPY Right    TENDON REPAIR Right 03/03/2013   Procedure: RIGHT DEBRIDEMENT AND TENOLYSIS OF PERONEOUS LONGOUS AND BREVIS TENDONS ;  Surgeon: Wylene Simmer, MD;  Location: [facility];  Service: Orthopedics;  Laterality: Right;   TOOTH EXTRACTION N/A 03/25/2019   Procedure: DENTAL EXTRACTIONS TEETH NUMBER THREE, FOUR, FIVE, SIX, SEVEN, NINE, TWELVE, FOURTEEN, THIRTY AND ALVEOLOPLASTY;  Surgeon: Diona Browner, DDS;  Location: [facility];  Service: Oral Surgery;  Laterality: N/A;    OB History   No obstetric history on file.      Home Medications    Prior to Admission medications   Medication Sig Start Date End Date Taking? Authorizing Provider  cephALEXin (KEFLEX) 500 MG capsule Take 1 capsule (500 mg total) by mouth 4 (four) times daily for 5 days. 12/20/21 12/25/21 Yes Oswaldo Conroy  E, FNP  acetaminophen (TYLENOL) 500 MG tablet Take 500 mg by mouth every 6 (six) hours as needed for mild pain.    [provider]  albuterol (VENTOLIN HFA) 108 (90 Base) MCG/ACT inhaler INHALE 2 PUFFS INTO THE LUNGS FOUR TIMES DAILY AS NEEDED FOR SHORTNESS OF BREATH. 06/21/20   Tisovec, Fransico Him, MD  amLODipine (NORVASC) 10 MG tablet Take 10 mg by mouth daily. 09/05/21   [provider]  apixaban (ELIQUIS) 5 MG TABS tablet Take 1 tablet (5 mg total) by mouth 2 (two) times daily. 05/30/21   Florencia Reasons, MD  diphenhydrAMINE (BENADRYL) 25 mg capsule Take 25 mg  by mouth every 6 (six) hours as needed for itching.    [provider]  methimazole (TAPAZOLE) 10 MG tablet Take 1 tablet (10 mg total) by mouth daily. 10/18/21   Philemon Kingdom, MD  ondansetron (ZOFRAN) 4 MG tablet Take 1 tablet (4 mg total) by mouth every 8 (eight) hours as needed for nausea or vomiting. 06/23/21   Hayden Rasmussen, MD  pantoprazole (PROTONIX) 40 MG tablet Take 1 tablet (40 mg total) by mouth daily. Patient taking differently: Take 40 mg by mouth every morning. 05/30/21   Florencia Reasons, MD  senna-docusate (SENOKOT-S) 8.6-50 MG tablet Take 1 tablet by mouth daily as needed for mild constipation. 06/19/21   Annita Brod, MD  metoprolol tartrate (LOPRESSOR) 25 MG tablet Take 1 tablet (25 mg total) by mouth 2 (two) times daily. 05/30/21 07/08/21  Florencia Reasons, MD    Family History Family History  Problem Relation Age of Onset   Thyroid disease Mother    Arthritis Mother    Hypertension Mother    Diabetes Mother    Kidney disease Mother    Arrhythmia Mother        AFib   Thyroid disease Sister    Arthritis Sister    Heart disease Sister    Hypertension Brother    Heart disease Maternal Grandfather    Heart attack Maternal Grandfather 1       MI   Heart disease Maternal Aunt    Heart failure Maternal Aunt    Heart disease Maternal Uncle    Cancer Maternal Uncle     Social History Social History   Tobacco Use   Smoking status: Never   Smokeless tobacco: Never  Vaping Use   Vaping Use: Never used  Substance Use Topics   Alcohol use: Yes    Comment: occasional   Drug use: No     Allergies   Shellfish allergy   Review of Systems Review of Systems Per HPI  Physical Exam Triage Vital Signs ED Triage Vitals  Enc Vitals Group     BP 12/20/21 1409 123/77     Pulse Rate 12/20/21 1409 68     Resp 12/20/21 1409 18     Temp 12/20/21 1409 97.8 F (36.6 C)     Temp Source 12/20/21 1409 Oral     SpO2 12/20/21 1409 96 %     Weight --      Height --       Head Circumference --      Peak Flow --      Pain Score 12/20/21 1407 0     Pain Loc --      Pain Edu? --      Excl. in Interior? --    No data found.  Updated Vital Signs BP 123/77 (BP Location: Right Arm)   Pulse  68   Temp 97.8 F (36.6 C) (Oral)   Resp 18   SpO2 96%   Visual Acuity Right Eye Distance:   Left Eye Distance:   Bilateral Distance:    Right Eye Near:   Left Eye Near:    Bilateral Near:     Physical Exam Constitutional:      General: She is not in acute distress.    Appearance: Normal appearance. She is not toxic-appearing or diaphoretic.  HENT:     Head: Normocephalic and atraumatic.  Eyes:     Extraocular Movements: Extraocular movements intact.     Conjunctiva/sclera: Conjunctivae normal.  Cardiovascular:     Rate and Rhythm: Normal rate and regular rhythm.     Pulses: Normal pulses.     Heart sounds: Normal heart sounds.  Pulmonary:     Effort: Pulmonary effort is normal. No respiratory distress.     Breath sounds: Normal breath sounds.  Abdominal:     General: Bowel sounds are normal. There is no distension.     Palpations: Abdomen is soft.     Tenderness: There is no abdominal tenderness.  Neurological:     General: No focal deficit present.     Mental Status: She is alert and oriented to person, place, and time. Mental status is at baseline.  Psychiatric:        Mood and Affect: Mood normal.        Behavior: Behavior normal.        Thought Content: Thought content normal.        Judgment: Judgment normal.      UC Treatments / Results  Labs (all labs ordered are listed, but only abnormal results are displayed) Labs Reviewed  POCT URINALYSIS DIPSTICK, ED / UC - Abnormal; Notable for the following components:      Result Value   Ketones, ur TRACE (*)    Hgb urine dipstick SMALL (*)    Protein, ur 100 (*)    Leukocytes,Ua SMALL (*)    All other components within normal limits  URINE CULTURE    EKG   Radiology No results  found.  Procedures Procedures (including critical care time)  Medications Ordered in UC Medications - No data to display  Initial Impression / Assessment and Plan / UC Course  I have reviewed the triage vital signs and the nursing notes.  Pertinent labs & imaging results that were available during my care of the patient were reviewed by me and considered in my medical decision making (see chart for details).     UA showing small leuks.  This could indicate urinary tract infection.  Will treat with cephalexin antibiotic.  Creatinine clearance appears to be 68 so no dosage adjustment necessary. Suspect ketones and protein are due to urinary tract infection versus low p.o. intake.  No concern for hyperglycemia or other worrisome etiologies.  Urine culture is pending.  Patient to follow-up if symptoms persist or worsen.  Patient verbalized understanding and was agreeable with plan. Final Clinical Impressions(s) / UC Diagnoses   Final diagnoses:  Urinary frequency  Lower urinary tract infection     Discharge Instructions      It appears that you have a urinary tract infection which is being treated with an antibiotic.  Urine culture is pending.  We will call when it results.  Please follow-up if symptoms persist or worsen.    ED Prescriptions     Medication Sig Dispense Auth. Provider   cephALEXin (KEFLEX) 500  MG capsule Take 1 capsule (500 mg total) by mouth 4 (four) times daily for 5 days. 20 capsule Teodora Medici, [place]      PDMP not reviewed this encounter.   Teodora Medici, [place] 12/20/21 [phone]

## 2021-12-20 NOTE — ED Triage Notes (Signed)
Pt c/o urinary frequency for a few days. Denies dysuria. Reports urine is yellow.

## 2021-12-20 NOTE — Discharge Instructions (Signed)
It appears that you have a urinary tract infection which is being treated with an antibiotic.  Urine culture is pending.  We will call when it results.  Please follow-up if symptoms persist or worsen.

## 2021-12-21 LAB — URINE CULTURE: Culture: NO GROWTH

## 2021-12-26 NOTE — Telephone Encounter (Signed)
1st attempt to reach pt regarding surgical clearance and the need for a tele visit, left a message for her to call back and ask for the preop team.  

## 2021-12-26 NOTE — Telephone Encounter (Signed)
Per Markus Daft in [facility], pt never mailed it back and Elwyn Reach has it reported as lost.

## 2021-12-26 NOTE — Telephone Encounter (Signed)
   Name: Karen Ferguson  DOB: July 25, 1955  MRN: [id number]  Primary Cardiologist: Minus Breeding, MD  Chart reviewed as part of pre-operative protocol coverage. Because of Sandra Brents Ruhland's past medical history and time since last visit, she will require a follow-up in-office visit in order to better assess preoperative cardiovascular risk.  Pre-op covering staff: - Please schedule appointment and call patient to inform them. If patient already had an upcoming appointment within acceptable timeframe, please add "pre-op clearance" to the appointment notes so provider is aware. - Please contact requesting surgeon's office via preferred method (i.e, phone, fax) to inform them of need for appointment prior to surgery.  Per office protocol, patient can hold Eliquis for 2 days prior to procedure.     Mable Fill, Marissa Nestle, NP  12/26/2021, 4:10 PM

## 2021-12-27 NOTE — Telephone Encounter (Signed)
Pt has been scheduled for a in office appt per preop provider

## 2022-01-01 NOTE — Progress Notes (Deleted)
Cardiology Clinic Note   Patient Name: Karen Ferguson [facility] Date of Encounter: 01/01/2022  Primary Care Provider:  Myrtie Neither, PA-C Primary Cardiologist:  Minus Breeding, MD  Patient Profile    Karen Ferguson 66 year old female presents the clinic today for preoperative cardiac evaluation and follow-up evaluation of her atrial fibrillation.  Past Medical History    Past Medical History:  Diagnosis Date   Arthritis    Asthma    Albuterol prn   Bronchitis    Degenerative disc disease    Depression    Diabetes mellitus without complication ([place])    Borderline diabetic in the past per pt   History of echocardiogram    Echo 1/18: EF 60-65, no RWMA, Gr 1 DD, PASP 20   Hypertension    PVC's (premature ventricular contractions)    Holter 12/17: NSR, occ PAC/PVCs, no AFib; one 4 beat run NSVT   Past Surgical History:  Procedure Laterality Date   ABDOMINAL HYSTERECTOMY  1988   for fibroid tumors   CARPAL TUNNEL RELEASE  1985   rt   [place] ARTHROSCOPY Right    TENDON REPAIR Right 03/03/2013   Procedure: RIGHT DEBRIDEMENT AND TENOLYSIS OF PERONEOUS LONGOUS AND BREVIS TENDONS ;  Surgeon: Wylene Simmer, MD;  Location: [facility];  Service: Orthopedics;  Laterality: Right;   TOOTH EXTRACTION N/A 03/25/2019   Procedure: DENTAL EXTRACTIONS TEETH NUMBER THREE, FOUR, FIVE, SIX, SEVEN, NINE, TWELVE, FOURTEEN, THIRTY AND ALVEOLOPLASTY;  Surgeon: Diona Browner, DDS;  Location: [facility];  Service: Oral Surgery;  Laterality: N/A;    Allergies  Allergies  Allergen Reactions   Shellfish Allergy Hives    History of Present Illness    Karen Ferguson has a PMH of HTN, paroxysmal atrial fibrillation, asthma, OSA, GERD, hypothyroidism, AKI, CKD stage II, dizziness, palpitations, and normal.  CHA2DS2-VASc score 4 (hypertension, diabetes, age, gender)  She was seen by Richardson Dopp, PA-C on 12/17 for palpitations.  She noted significant  fatigue and dyspnea.  She reported using 3 pillows at night to sleep.  Her cardiac event monitor showed no A-fib or pauses and one 4 beat run of NSVT.  Her echocardiogram showed an EF of 60 to 65% with G1 DD and no regional wall motion abnormality.  Her carotid Dopplers showed 1-39% stenosis of her right and left ICA.  She was seen in follow-up by Ambrose Pancoast, NP-C on 10/31/2021.  During that time she denied palpitations.  She was in sinus rhythm.  She occasional bouts of presyncope and dizziness.  She noted vision changes and reported vision blurriness.  She had been seen by endocrinology for thyroid evaluation.  She was noted to have left carotid bruit.  She denied chest pain, palpitations, dyspnea, lower extremity swelling and weight gain.  A 14-day cardiac event monitor was ordered for evaluation of her presyncope.  Her cardiac event monitor had not yet resulted.  She presents the clinic today for follow-up evaluation and states***  *** denies chest pain, shortness of breath, lower extremity edema, fatigue, palpitations, melena, hematuria, hemoptysis, diaphoresis, weakness, presyncope, syncope, orthopnea, and PND.  Presyncope-denies further episodes of lightheadedness, presyncope or syncope.  Wore cardiac event monitor for 14 days and was apparently lost.  She is orthostatic negative.  Appears to be related to episode of dehydration and changing positions quickly. Continue to monitor Increase physical activity as tolerated Maintain p.o. hydration, change positions slowly Lower extremity  support stockings, abdominal binder Order CBC, BMP Order echocardiogram  Atrial fibrillation-heart rate today***.  Denies episodes of accelerated or irregular heart rate.  Reports compliance with apixaban and denies bleeding issues. Continue apixaban Heart healthy low-sodium diet Increase physical activity as tolerated Avoid triggers caffeine, chocolate, EtOH, dehydration etc.  Essential hypertension-BP  today***. Continue amlodipine Continue heart healthy diet Maintain blood pressure log  Preoperative cardiac evaluation-excision of left hip mass     Primary Cardiologist: Minus Breeding, MD  Chart reviewed as part of pre-operative protocol coverage. Given past medical history and time since last visit, based on ACC/AHA guidelines, Lyssa J Rochette would be at acceptable risk for the planned procedure without further cardiovascular testing.   Patient was advised that if he/she*** develops new symptoms prior to surgery to contact our office to arrange a follow-up appointment.  He verbalized understanding.  Patient with diagnosis of afib on Eliquis for anticoagulation.     Procedure: excision of left hip mass Date of procedure: TBD   CHA2DS2-VASc Score = 4  This indicates a 4.8% annual risk of stroke. The patient's score is based upon: CHF History: 0 HTN History: 1 Diabetes History: 0 Stroke History: 0 Vascular Disease History: 1 Age Score: 1 Gender Score: 1   CrCl 107m/min using adjusted body weight due to obesity Platelet count 229K   Per office protocol, patient can hold Eliquis for 2 days prior to procedure.   Disposition: Follow-up with Dr. HPercival Spanishin 6 months.       Home Medications    Prior to Admission medications   Medication Sig Start Date End Date Taking? Authorizing Provider  acetaminophen (TYLENOL) 500 MG tablet Take 500 mg by mouth every 6 (six) hours as needed for mild pain.    [provider]  albuterol (VENTOLIN HFA) 108 (90 Base) MCG/ACT inhaler INHALE 2 PUFFS INTO THE LUNGS FOUR TIMES DAILY AS NEEDED FOR SHORTNESS OF BREATH. 06/21/20   Tisovec, RFransico Him MD  amLODipine (NORVASC) 10 MG tablet Take 10 mg by mouth daily. 09/05/21   [provider]  apixaban (ELIQUIS) 5 MG TABS tablet Take 1 tablet (5 mg total) by mouth 2 (two) times daily. 05/30/21   XFlorencia Reasons MD  diphenhydrAMINE (BENADRYL) 25 mg capsule Take 25 mg by mouth every 6 (six)  hours as needed for itching.    [provider]  methimazole (TAPAZOLE) 10 MG tablet Take 1 tablet (10 mg total) by mouth daily. 10/18/21   GPhilemon Kingdom MD  ondansetron (ZOFRAN) 4 MG tablet Take 1 tablet (4 mg total) by mouth every 8 (eight) hours as needed for nausea or vomiting. 06/23/21   BHayden Rasmussen MD  pantoprazole (PROTONIX) 40 MG tablet Take 1 tablet (40 mg total) by mouth daily. Patient taking differently: Take 40 mg by mouth every morning. 05/30/21   XFlorencia Reasons MD  senna-docusate (SENOKOT-S) 8.6-50 MG tablet Take 1 tablet by mouth daily as needed for mild constipation. 06/19/21   KAnnita Brod MD  metoprolol tartrate (LOPRESSOR) 25 MG tablet Take 1 tablet (25 mg total) by mouth 2 (two) times daily. 05/30/21 07/08/21  XFlorencia Reasons MD    Family History    Family History  Problem Relation Age of Onset   Thyroid disease Mother    Arthritis Mother    Hypertension Mother    Diabetes Mother    Kidney disease Mother    Arrhythmia Mother        AFib   Thyroid disease Sister  Arthritis Sister    Heart disease Sister    Hypertension Brother    Heart disease Maternal Grandfather    Heart attack Maternal Grandfather 64       MI   Heart disease Maternal Aunt    Heart failure Maternal Aunt    Heart disease Maternal Uncle    Cancer Maternal Uncle    She indicated that her mother is deceased. She indicated that her father is alive. She indicated that the status of her sister is unknown. She indicated that the status of her brother is unknown. She indicated that her maternal grandmother is deceased. She indicated that her maternal grandfather is deceased. She indicated that her paternal grandmother is deceased. She indicated that her paternal grandfather is deceased. She indicated that the status of her maternal aunt is unknown. She indicated that the status of her maternal uncle is unknown.  Social History    Social History   Socioeconomic History   Marital status:  Single    Spouse name: Not on file   Number of children: 1   Years of education: Not on file   Highest education level: Not on file  Occupational History   Not on file  Tobacco Use   Smoking status: Never   Smokeless tobacco: Never  Vaping Use   Vaping Use: Never used  Substance and Sexual Activity   Alcohol use: Yes    Comment: occasional   Drug use: No   Sexual activity: Not Currently  Other Topics Concern   Not on file  Social History Narrative   Works at [employer] ([employer]) in [employer].     Single   1 daughter - passed away at age 59   Lives with her 2 grandchildren.   Right Handed   Social Determinants of Health   Financial Resource Strain: Not on file  Food Insecurity: Not on file  Transportation Needs: Not on file  Physical Activity: Not on file  Stress: Not on file  Social Connections: Not on file  Intimate Partner Violence: Not on file     Review of Systems    General:  No chills, fever, night sweats or weight changes.  Cardiovascular:  No chest pain, dyspnea on exertion, edema, orthopnea, palpitations, paroxysmal nocturnal dyspnea. Dermatological: No rash, lesions/masses Respiratory: No cough, dyspnea Urologic: No hematuria, dysuria Abdominal:   No nausea, vomiting, diarrhea, bright red blood per rectum, melena, or hematemesis Neurologic:  No visual changes, wkns, changes in mental status. All other systems reviewed and are otherwise negative except as noted above.  Physical Exam    VS:  There were no vitals taken for this visit. , BMI There is no height or weight on file to calculate BMI. GEN: Well nourished, well developed, in no acute distress. HEENT: normal. Neck: Supple, no JVD, carotid bruits, or masses. Cardiac: RRR, no murmurs, rubs, or gallops. No clubbing, cyanosis, edema.  Radials/DP/PT 2+ and equal bilaterally.  Respiratory:  Respirations regular and unlabored, clear to auscultation bilaterally. GI: Soft, nontender, nondistended,  BS + x 4. MS: no deformity or atrophy. Skin: warm and dry, no rash. Neuro:  Strength and sensation are intact. Psych: Normal affect.  Accessory Clinical Findings    Recent Labs: 06/18/2021: Magnesium 1.8 06/23/2021: ALT 29; BUN 15; Creatinine, Ser 0.96; Hemoglobin 10.0; Platelets 203; Potassium 3.3; Sodium 133 09/10/2021: TSH 10.69   Recent Lipid Panel    Component Value Date/Time   CHOL 164 01/10/2015 1009   TRIG 49 05/27/2021 0300  HDL 47 01/10/2015 1009   CHOLHDL 3.5 01/10/2015 1009   VLDL 20 01/10/2015 1009   LDLCALC 97 01/10/2015 1009    ECG personally reviewed by me today- *** - No acute changes  Echocardiogram 05/29/2016  Study Conclusions   - Left ventricle: The cavity size was normal. Systolic function was    normal. The estimated ejection fraction was in the range of 60%    to 65%. Wall motion was normal; there were no regional wall    motion abnormalities. Doppler parameters are consistent with    abnormal left ventricular relaxation (grade 1 diastolic    dysfunction). Doppler parameters are consistent with high    ventricular filling pressure.  - Aortic valve: Transvalvular velocity was within the normal range.    There was no stenosis. There was no regurgitation.  - Mitral valve: Transvalvular velocity was within the normal range.    There was no evidence for stenosis. There was trivial    regurgitation.  - Right ventricle: The cavity size was normal. Wall thickness was    normal. Systolic function was normal.  - Tricuspid valve: There was trivial regurgitation.  - Pulmonary arteries: Systolic pressure was within the normal    range. PA peak pressure: 20 mm Hg (S).  Assessment & Plan   1.  ***   Jossie Ng. Aylah Yeary NP-C     01/01/2022, 7:24 AM [address] Office [phone] Fax [phone]  Notice: This dictation was prepared with Dragon dictation along with smaller phrase technology. Any  transcriptional errors that result from this process are unintentional and may not be corrected upon review.  I spent***minutes examining this patient, reviewing medications, and using patient centered shared decision making involving her cardiac care.  Prior to her visit I spent greater than 20 minutes reviewing her past medical history,  medications, and prior cardiac tests.

## 2022-01-02 ENCOUNTER — Ambulatory Visit: Payer: Medicare Other | Admitting: General Practice

## 2022-01-24 ENCOUNTER — Other Ambulatory Visit: Payer: Medicare Other

## 2022-01-24 ENCOUNTER — Other Ambulatory Visit: Payer: Self-pay | Admitting: Endocrinology

## 2022-01-24 DIAGNOSIS — E0590 Thyrotoxicosis, unspecified without thyrotoxic crisis or storm: Secondary | ICD-10-CM

## 2022-01-29 ENCOUNTER — Ambulatory Visit: Payer: Medicare Other | Admitting: Endocrinology

## 2022-02-03 NOTE — Progress Notes (Deleted)
Cardiology Office Note:    Date:  02/03/2022   ID:  Karen Ferguson, Karen Ferguson 1955/08/11, MRN [id number]  PCP:  Jackson, Kerra J, [place] Providers Cardiologist:  Minus Breeding, MD { Click to update primary MD,subspecialty MD or APP then REFRESH:1}    Referring MD: Myrtie Neither, PA-C   No chief complaint on file. ***  History of Present Illness:    Karen Ferguson is a 66 y.o. female with a hx of ***  Hypertension Paroxysmal atrial fibrillation Asthma OSA GERD Pancreatitis Hyperthyroidism, thyroid nodule (sees endocrinology) CKD Obesity  Originally evaluated in 2017 for chief complaint of palpitations, orthopnea, fatigue, dyspnea (reported sleeping on 3 pillows at night).  Wore a 48-hour Holter monitor, overall normal study-1 4 beat run of NSVT noted.  2D echocardiogram revealed LVEF 60 to 08%, grade 1 diastolic dysfunction, no RWMA.  Carotid ultrasound revealed 1 to 39% stenosis on the right and left ICA.  Diagnosed with new onset atrial fibrillation in January 2023 during hospitalization, also noted to have acute kidney injury due to poor p.o. intake.  Was started on anticoagulation-Eliquis and rate control medicine, metoprolol.  Repeat 2D echocardiogram performed [facility] revealed LVEF 66%, no LVH and normal LV function.  Last seen by Ambrose Pancoast, NP on October 31, 2021.  Was found to be in sinus rhythm and reported presyncopal symptoms and occasional episodes of dizziness.  Reported visual symptoms including blurriness and spots.  Seeing an endocrinologist for her history of thyroid issues.  14-day ZIO monitor arranged >>>  Preop clearance, however do not have a 14-day ZIO monitor results.  Will need this before official clearance.  Excision of left mass of than hip.    Paroxysmal atrial fibrillation Presyncope/dizziness Thyroid nodule Hyperthyroidism  Past Medical History:  Diagnosis Date   Arthritis    Asthma    Albuterol prn   Bronchitis     Degenerative disc disease    Depression    Diabetes mellitus without complication ([place])    Borderline diabetic in the past per pt   History of echocardiogram    Echo 1/18: EF 60-65, no RWMA, Gr 1 DD, PASP 20   Hypertension    PVC's (premature ventricular contractions)    Holter 12/17: NSR, occ PAC/PVCs, no AFib; one 4 beat run NSVT    Past Surgical History:  Procedure Laterality Date   ABDOMINAL HYSTERECTOMY  1988   for fibroid tumors   CARPAL TUNNEL RELEASE  1985   rt   [place] ARTHROSCOPY Right    TENDON REPAIR Right 03/03/2013   Procedure: RIGHT DEBRIDEMENT AND TENOLYSIS OF PERONEOUS LONGOUS AND BREVIS TENDONS ;  Surgeon: Wylene Simmer, MD;  Location: [facility];  Service: Orthopedics;  Laterality: Right;   TOOTH EXTRACTION N/A 03/25/2019   Procedure: DENTAL EXTRACTIONS TEETH NUMBER THREE, FOUR, FIVE, SIX, SEVEN, NINE, TWELVE, FOURTEEN, THIRTY AND ALVEOLOPLASTY;  Surgeon: Diona Browner, DDS;  Location: [facility];  Service: Oral Surgery;  Laterality: N/A;    Current Medications: No outpatient medications have been marked as taking for the 02/04/22 encounter (Appointment) with Deberah Pelton, NP.     Allergies:   Shellfish allergy   Social History   Socioeconomic History   Marital status: Single    Spouse name: Not on file   Number of children: 1   Years of education: Not on file   Highest education level: Not on file  Occupational History   Not on file  Tobacco Use   Smoking status: Never   Smokeless tobacco: Never  Vaping Use   Vaping Use: Never used  Substance and Sexual Activity   Alcohol use: Yes    Comment: occasional   Drug use: No   Sexual activity: Not Currently  Other Topics Concern   Not on file  Social History Narrative   Works at [employer] ([employer]) in [employer].     Single   1 daughter - passed away at age 35   Lives with her 2 grandchildren.   Right Handed   Social  Determinants of Health   Financial Resource Strain: Not on file  Food Insecurity: Not on file  Transportation Needs: Not on file  Physical Activity: Not on file  Stress: Not on file  Social Connections: Not on file     Family History: The patient's ***family history includes Arrhythmia in her mother; Arthritis in her mother and sister; Cancer in her maternal uncle; Diabetes in her mother; Heart attack (age of onset: 36) in her maternal grandfather; Heart disease in her maternal aunt, maternal grandfather, maternal uncle, and sister; Heart failure in her maternal aunt; Hypertension in her brother and mother; Kidney disease in her mother; Thyroid disease in her mother and sister.  ROS:   Please see the history of present illness.    *** All other systems reviewed and are negative.  EKGs/Labs/Other Studies Reviewed:    The following studies were reviewed today: ***  EKG:  EKG is *** ordered today.  The ekg ordered today demonstrates ***  Recent Labs: 06/18/2021: Magnesium 1.8 06/23/2021: ALT 29; BUN 15; Creatinine, Ser 0.96; Hemoglobin 10.0; Platelets 203; Potassium 3.3; Sodium 133 09/10/2021: TSH 10.69  Recent Lipid Panel    Component Value Date/Time   CHOL 164 01/10/2015 1009   TRIG 49 05/27/2021 0300   HDL 47 01/10/2015 1009   CHOLHDL 3.5 01/10/2015 1009   VLDL 20 01/10/2015 1009   LDLCALC 97 01/10/2015 1009     Risk Assessment/Calculations:   {Does this patient have ATRIAL FIBRILLATION?:[phone]}  No BP recorded.  {Refresh Note OR Click here to enter BP  :1}***         Physical Exam:    VS:  There were no vitals taken for this visit.    Wt Readings from Last 3 Encounters:  10/31/21 191 lb 6.4 oz (86.8 kg)  09/10/21 182 lb 3.2 oz (82.6 kg)  08/09/21 180 lb (81.6 kg)     GEN: *** Well nourished, well developed in no acute distress HEENT: Normal NECK: No JVD; No carotid bruits LYMPHATICS: No lymphadenopathy CARDIAC: ***RRR, no murmurs, rubs,  gallops RESPIRATORY:  Clear to auscultation without rales, wheezing or rhonchi  ABDOMEN: Soft, non-tender, non-distended MUSCULOSKELETAL:  No edema; No deformity  SKIN: Warm and dry NEUROLOGIC:  Alert and oriented x 3 PSYCHIATRIC:  Normal affect   ASSESSMENT:    No diagnosis found. PLAN:    In order of problems listed above:  ***      {Are you ordering a CV Procedure (e.g. stress test, cath, DCCV, TEE, etc)?   Press F2        :[id number]}    Medication Adjustments/Labs and Tests Ordered: Current medicines are reviewed at length with the patient today.  Concerns regarding medicines are outlined above.  No orders of the defined types were placed in this encounter.  No orders of the defined types were placed in this encounter.  There are no Patient Instructions on file for this visit.   SignedFinis Bud, NP  02/03/2022 10:12 PM    [place]

## 2022-02-04 ENCOUNTER — Ambulatory Visit: Payer: Medicare Other | Attending: Cardiology | Admitting: General Practice

## 2022-05-22 ENCOUNTER — Ambulatory Visit: Payer: Medicare Other | Admitting: Endocrinology

## 2022-05-22 NOTE — Progress Notes (Deleted)
Name: Brexley Cutshaw [facility]  MRN/ DOB: [id number], Oct 19, 1955    Age/ Sex: 66 y.o., female     PCP: Berna Bue   Reason for Endocrinology Evaluation: Hyperthyroidism     Initial Endocrinology Clinic Visit: ***    PATIENT IDENTIFIER: [name] is a 66 y.o., female with a past medical history of HTN, A.Fib, MNG. She has followed with [facility] Endocrinology clinic since *** for consultative assistance with management of her hyperthyroidism.   HISTORICAL SUMMARY: The patient was first diagnosed with hyperthyroidism in 05/2021 with a suppressed TSH <0.01 uIU/mL and elevated FT4 at 2.91 ng/dL   She was started on Methimazole at the time   Thyroid ultrasound 05/2021 revealed MNG. She is S/P benign FNA of the isthmic 2.5 cm nodule ( Bethesda category II) on 08/26/2021     SUBJECTIVE:    Today (05/22/2022):  [name] is here for a follow up on hyperthyroidism and MNG   Methimazole 10 mg daily   HISTORY:  Past Medical History:  Past Medical History:  Diagnosis Date   Arthritis    Asthma    Albuterol prn   Bronchitis    Degenerative disc disease    Depression    Diabetes mellitus without complication ([place])    Borderline diabetic in the past per pt   History of echocardiogram    Echo 1/18: EF 60-65, no RWMA, Gr 1 DD, PASP 20   Hypertension    PVC's (premature ventricular contractions)    Holter 12/17: NSR, occ PAC/PVCs, no AFib; one 4 beat run NSVT   Past Surgical History:  Past Surgical History:  Procedure Laterality Date   ABDOMINAL HYSTERECTOMY  1988   for fibroid tumors   [place] ARTHROSCOPY Right    TENDON REPAIR Right 03/03/2013   Procedure: RIGHT DEBRIDEMENT AND TENOLYSIS OF PERONEOUS LONGOUS AND BREVIS TENDONS ;  Surgeon: Wylene Simmer, MD;  Location: [facility];  Service: Orthopedics;  Laterality: Right;   TOOTH EXTRACTION N/A 03/25/2019   Procedure: DENTAL  EXTRACTIONS TEETH NUMBER THREE, FOUR, FIVE, SIX, SEVEN, NINE, TWELVE, FOURTEEN, THIRTY AND ALVEOLOPLASTY;  Surgeon: Diona Browner, DDS;  Location: [facility];  Service: Oral Surgery;  Laterality: N/A;   Social History:  reports that she has never smoked. She has never used smokeless tobacco. She reports current alcohol use. She reports that she does not use drugs. Family History:  Family History  Problem Relation Age of Onset   Thyroid disease Mother    Arthritis Mother    Hypertension Mother    Diabetes Mother    Kidney disease Mother    Arrhythmia Mother        AFib   Thyroid disease Sister    Arthritis Sister    Heart disease Sister    Hypertension Brother    Heart disease Maternal Grandfather    Heart attack Maternal Grandfather 46       MI   Heart disease Maternal Aunt    Heart failure Maternal Aunt    Heart disease Maternal Uncle    Cancer Maternal Uncle      HOME MEDICATIONS: Allergies as of 05/23/2022       Reactions   Shellfish Allergy Hives        Medication List        Accurate as of May 22, 2022  4:03 PM. If you  have any questions, ask your nurse or doctor.          acetaminophen 500 MG tablet Commonly known as: TYLENOL Take 500 mg by mouth every 6 (six) hours as needed for mild pain.   albuterol 108 (90 Base) MCG/ACT inhaler Commonly known as: VENTOLIN HFA INHALE 2 PUFFS INTO THE LUNGS FOUR TIMES DAILY AS NEEDED FOR SHORTNESS OF BREATH.   amLODipine 10 MG tablet Commonly known as: NORVASC Take 10 mg by mouth daily.   diphenhydrAMINE 25 mg capsule Commonly known as: BENADRYL Take 25 mg by mouth every 6 (six) hours as needed for itching.   Eliquis 5 MG Tabs tablet Generic drug: apixaban Take 1 tablet (5 mg total) by mouth 2 (two) times daily.   methimazole 10 MG tablet Commonly known as: TAPAZOLE Take 1 tablet (10 mg total) by mouth daily.   ondansetron 4 MG tablet Commonly known as: ZOFRAN Take 1 tablet (4 mg total) by mouth every 8  (eight) hours as needed for nausea or vomiting.   pantoprazole 40 MG tablet Commonly known as: PROTONIX Take 1 tablet (40 mg total) by mouth daily. What changed: when to take this   senna-docusate 8.6-50 MG tablet Commonly known as: Senokot-S Take 1 tablet by mouth daily as needed for mild constipation.          OBJECTIVE:   PHYSICAL EXAM: VS: There were no vitals taken for this visit.   EXAM: General: Pt appears well and is in NAD  Eyes: External eye exam normal without stare, lid lag or exophthalmos.  EOM intact.    Neck: General: Supple without adenopathy. Thyroid: Thyroid size normal.  No goiter or nodules appreciated. No thyroid bruit.  Lungs: Clear with good BS bilat with no rales, rhonchi, or wheezes  Heart: Auscultation: RRR.  Abdomen: Normoactive bowel sounds, soft, nontender, without masses or organomegaly palpable  Extremities:  BL LE: No pretibial edema normal ROM and strength.  Mental Status: Judgment, insight: Intact Orientation: Oriented to time, place, and person Mood and affect: No depression, anxiety, or agitation     DATA REVIEWED: ***  FNA isthmic nodule 08/26/2021  Clinical History: Isthmus; Superior 2.5cm' Other 2 dimensions: 1.5 x  1.9cm, Solid / almost completely solid, Isoechoic, TI-RADS total points  6  Specimen Submitted:  A. THYROID, SUPERIOR ISTHMUS, FINE NEEDLE  ASPIRATION:    FINAL MICROSCOPIC DIAGNOSIS:  - Consistent with benign follicular nodule (Bethesda category II)    ASSESSMENT / PLAN / RECOMMENDATIONS:   Hyperthyroidism    Medications   ***   Signed electronically by: Mack Guise, MD  [facility] Endocrinology  [facility] [address] [place], [place] [address] Phone: [phone] FAX: [phone]      CC: Berna Bue [address] [place] [address] Phone: [phone]  Fax: [phone]   Return to Endocrinology clinic as  below: Future Appointments  Date Time Provider [place]  05/23/2022  7:30 AM Braiden Presutti, Melanie Crazier, MD [id number] None

## 2022-05-23 ENCOUNTER — Ambulatory Visit: Payer: Medicare Other | Admitting: Internal Medicine

## 2022-05-23 ENCOUNTER — Encounter: Payer: Self-pay | Admitting: Internal Medicine

## 2022-05-27 ENCOUNTER — Encounter: Payer: Self-pay | Admitting: Internal Medicine

## 2022-05-27 ENCOUNTER — Ambulatory Visit (INDEPENDENT_AMBULATORY_CARE_PROVIDER_SITE_OTHER): Payer: Medicare Other | Admitting: Internal Medicine

## 2022-05-27 VITALS — BP 116/70 | HR 67 | Ht 64.0 in | Wt 203.0 lb

## 2022-05-27 DIAGNOSIS — E042 Nontoxic multinodular goiter: Secondary | ICD-10-CM | POA: Diagnosis not present

## 2022-05-27 DIAGNOSIS — E0590 Thyrotoxicosis, unspecified without thyrotoxic crisis or storm: Secondary | ICD-10-CM | POA: Diagnosis not present

## 2022-05-27 LAB — T4, FREE: Free T4: 0.27 ng/dL — ABNORMAL LOW (ref 0.60–1.60)

## 2022-05-27 LAB — TSH: TSH: 87.64 u[IU]/mL — ABNORMAL HIGH (ref 0.35–5.50)

## 2022-05-27 NOTE — Progress Notes (Unsigned)
Name: Karen Ferguson [facility]  MRN/ DOB: [id number], September 04, 1955    Age/ Sex: 67 y.o., female     PCP: Berna Bue   Reason for Endocrinology Evaluation: Hyperthyroidism     Initial Endocrinology Clinic Visit: 06/05/2021    PATIENT IDENTIFIER: Karen Ferguson is a 67 y.o., female with a past medical history of HTN, A.Fib, MNG. She has followed with [facility]ocr[facility] clinic since 06/05/2021 for consultative assistance with management of her hyperthyroidism.   HISTORICAL SUMMARY: The patient was first diagnosed with hyperthyroidism in 05/2021 with a suppressed TSH <0.01 uIU/mL and elevated FT4 at 2.91 ng/dL   She was started on Methimazole 05/2021  Thyroid ultrasound 05/2021 revealed MNG. She is S/P benign FNA of the isthmic 2.5 cm nodule ( Bethesda category II) on 08/26/2021   Sister with thyroid disease    She was seen by Dr. Loanne Drilling from January 2023 until April 2023 SUBJECTIVE:    Today (05/27/2022):  Karen Ferguson is here for a follow up on hyperthyroidism and MNG  Pt has been noted with weight gain   Denies local neck swelling  Occasional palpitations  Denies tremors  Has chronic constipation  Has noted headaches   Methimazole 10 mg BID   HISTORY:  Past Medical History:  Past Medical History:  Diagnosis Date   Arthritis    Asthma    Albuterol prn   Bronchitis    Degenerative disc disease    Depression    Diabetes mellitus without complication ([place])    Borderline diabetic in the past per pt   History of echocardiogram    Echo 1/18: EF 60-65, no RWMA, Gr 1 DD, PASP 20   Hypertension    PVC's (premature ventricular contractions)    Holter 12/17: NSR, occ PAC/PVCs, no AFib; one 4 beat run NSVT   Past Surgical History:  Past Surgical History:  Procedure Laterality Date   ABDOMINAL HYSTERECTOMY  1988   for fibroid tumors   [place] ARTHROSCOPY Right    TENDON REPAIR Right  03/03/2013   Procedure: RIGHT DEBRIDEMENT AND TENOLYSIS OF PERONEOUS LONGOUS AND BREVIS TENDONS ;  Surgeon: Wylene Simmer, MD;  Location: [facility];  Service: Orthopedics;  Laterality: Right;   TOOTH EXTRACTION N/A 03/25/2019   Procedure: DENTAL EXTRACTIONS TEETH NUMBER THREE, FOUR, FIVE, SIX, SEVEN, NINE, TWELVE, FOURTEEN, THIRTY AND ALVEOLOPLASTY;  Surgeon: Diona Browner, DDS;  Location: [facility];  Service: Oral Surgery;  Laterality: N/A;   Social History:  reports that she has never smoked. She has never used smokeless tobacco. She reports current alcohol use. She reports that she does not use drugs. Family History:  Family History  Problem Relation Age of Onset   Thyroid disease Mother    Arthritis Mother    Hypertension Mother    Diabetes Mother    Kidney disease Mother    Arrhythmia Mother        AFib   Thyroid disease Sister    Arthritis Sister    Heart disease Sister    Hypertension Brother    Heart disease Maternal Grandfather    Heart attack Maternal Grandfather 52       MI   Heart disease Maternal Aunt    Heart failure Maternal Aunt    Heart disease Maternal Uncle    Cancer Maternal Uncle      HOME MEDICATIONS: Allergies  as of 05/27/2022       Reactions   Shellfish Allergy Hives        Medication List        Accurate as of May 27, 2022  2:09 PM. If you have any questions, ask your nurse or doctor.          STOP taking these medications    famotidine 20 MG tablet Commonly known as: PEPCID Stopped by: Dorita Sciara, MD       TAKE these medications    acetaminophen 500 MG tablet Commonly known as: TYLENOL Take 500 mg by mouth every 6 (six) hours as needed for mild pain.   albuterol 108 (90 Base) MCG/ACT inhaler Commonly known as: VENTOLIN HFA INHALE 2 PUFFS INTO THE LUNGS FOUR TIMES DAILY AS NEEDED FOR SHORTNESS OF BREATH.   amLODipine 10 MG tablet Commonly known as: NORVASC Take 10 mg by mouth daily.   diphenhydrAMINE  25 mg capsule Commonly known as: BENADRYL Take 25 mg by mouth every 6 (six) hours as needed for itching.   Eliquis 5 MG Tabs tablet Generic drug: apixaban Take 1 tablet (5 mg total) by mouth 2 (two) times daily.   methimazole 10 MG tablet Commonly known as: TAPAZOLE Take 1 tablet (10 mg total) by mouth daily. What changed: when to take this   omeprazole 40 MG capsule Commonly known as: PRILOSEC Take 40 mg by mouth daily.   ondansetron 4 MG tablet Commonly known as: ZOFRAN Take 1 tablet (4 mg total) by mouth every 8 (eight) hours as needed for nausea or vomiting.   pantoprazole 40 MG tablet Commonly known as: PROTONIX Take 1 tablet (40 mg total) by mouth daily.   senna-docusate 8.6-50 MG tablet Commonly known as: Senokot-S Take 1 tablet by mouth daily as needed for mild constipation.          OBJECTIVE:   PHYSICAL EXAM: VS: BP 116/70 (BP Location: Left Arm, Patient Position: Sitting, Cuff Size: Large)   Pulse 67   Ht '5\' 4"'$  (1.626 m)   Wt 203 lb (92.1 kg)   SpO2 96%   BMI 34.84 kg/m    EXAM: General: Pt appears well and is in NAD  Eyes: External eye exam normal without stare, or exophthalmos.   Neck: General: Supple without adenopathy. Thyroid: Thyroid  nodules appreciated  Lungs: Clear with good BS bilat with no rales, rhonchi, or wheezes  Heart: Auscultation: RRR.  Extremities:  BL LE: No pretibial edema   Mental Status: Judgment, insight: Intact Orientation: Oriented to time, place, and person Mood and affect: No depression, anxiety, or agitation     DATA REVIEWED: ***  FNA isthmic nodule 08/26/2021  Clinical History: Isthmus; Superior 2.5cm' Other 2 dimensions: 1.5 x  1.9cm, Solid / almost completely solid, Isoechoic, TI-RADS total points  6  Specimen Submitted:  A. THYROID, SUPERIOR ISTHMUS, FINE NEEDLE  ASPIRATION:    FINAL MICROSCOPIC DIAGNOSIS:  - Consistent with benign follicular nodule (Bethesda category II)   Old records , labs and  images have been reviewed.   ASSESSMENT / PLAN / RECOMMENDATIONS:   Hyperthyroidism  -Patient is clinically euthyroid -Tolerating methimazole without side effects   Medications   Methimazole 10 mg twice daily   2.  Multinodular goiter:  -No local neck symptoms -She is s/p benign FNA of the isthmic 2.5 cm nodule 08/2021 -We will proceed with repeat thyroid ultrasound  Follow-up in 6 months Signed electronically by: Mack Guise, MD  [facility] Endocrinology  [facility] [address], [place], [place] [address] Phone: [phone] FAX: [phone]      CC: Berna Bue [address] [place] [address] Phone: [phone]  Fax: [phone]   Return to Endocrinology clinic as below: No future appointments.

## 2022-05-28 ENCOUNTER — Telehealth: Payer: Self-pay | Admitting: Internal Medicine

## 2022-05-28 LAB — T3: T3, Total: 78 ng/dL (ref 76–181)

## 2022-05-28 MED ORDER — METHIMAZOLE 5 MG PO TABS: 5.0000 mg | ORAL_TABLET | Freq: Every day | ORAL | 1 refills | Status: DC

## 2022-05-28 NOTE — Telephone Encounter (Signed)
Patient advised and verbalized understanding. Patient will pick up new dose of medication. La scheduled for 06/27/22

## 2022-05-28 NOTE — Telephone Encounter (Signed)
Please let the patient know that the current dose of methimazole is way TOO much for her and she is currently under active because of that   We will need to back off on the methimazole from 20 mg daily to 5 mg daily    I will send a new prescription of the 5 mg, in the meantime she can just take half a tablet of the once she has at home because they are the 10 mg tablets    Please schedule her for repeat labs in a month  Thanks

## 2022-06-02 ENCOUNTER — Other Ambulatory Visit: Payer: Self-pay

## 2022-06-02 ENCOUNTER — Telehealth: Payer: Self-pay | Admitting: Cardiology

## 2022-06-02 MED ORDER — METOPROLOL TARTRATE 25 MG PO TABS: 25.0000 mg | ORAL_TABLET | Freq: Two times a day (BID) | ORAL | 3 refills | Status: DC

## 2022-06-02 NOTE — Telephone Encounter (Signed)
Patient is returning call to discuss monitor results. 

## 2022-06-02 NOTE — Telephone Encounter (Signed)
Called patient back, advised of results:  Please let Ms.Olgun know that her event monitor showed sinus rhythm with some artifact with frequent extra beats in the top portion of the heart.  Please check with the patient to see if she is having any more syncope.  If she is symptomatic we can resume metoprolol 25 mg twice daily.  Please let me know if have any additional questions.   Ambrose Pancoast, NP   Sent Metoprolol Tartrate (Lopressor) 25 mg twice daily to the pharmacy. -verified with patient.   Thank you!

## 2022-06-06 ENCOUNTER — Ambulatory Visit
Admission: RE | Admit: 2022-06-06 | Discharge: 2022-06-06 | Disposition: A | Discharge status: Home / Self Care | Payer: Medicare Other | Source: Ambulatory Visit | Attending: Internal Medicine | Admitting: Internal Medicine

## 2022-06-06 DIAGNOSIS — E042 Nontoxic multinodular goiter: Secondary | ICD-10-CM

## 2022-06-24 NOTE — Progress Notes (Deleted)
Office Visit    Patient Name: Karen Ferguson Date of Encounter: 06/24/2022  Primary Care Provider:  Myrtie Neither, PA-C Primary Cardiologist:  Minus Breeding, MD Primary Electrophysiologist: None  Chief Complaint    Karen Ferguson is a 67 y.o. female with PMH of HTN, OSA, atrial fibrillation on Eliquis, hypothyroidism, thyroid nodule, obesity and carotid artery stenosis who presents today for follow-up of hypertension and atrial fibrillation.  Past Medical History    Past Medical History:  Diagnosis Date   Arthritis    Asthma    Albuterol prn   Bronchitis    Degenerative disc disease    Depression    Diabetes mellitus without complication ([place])    Borderline diabetic in the past per pt   History of echocardiogram    Echo 1/18: EF 60-65, no RWMA, Gr 1 DD, PASP 20   Hypertension    PVC's (premature ventricular contractions)    Holter 12/17: NSR, occ PAC/PVCs, no AFib; one 4 beat run NSVT   Past Surgical History:  Procedure Laterality Date   ABDOMINAL HYSTERECTOMY  1988   for fibroid tumors   CARPAL TUNNEL RELEASE  1985   rt   [place] ARTHROSCOPY Right    TENDON REPAIR Right 03/03/2013   Procedure: RIGHT DEBRIDEMENT AND TENOLYSIS OF PERONEOUS LONGOUS AND BREVIS TENDONS ;  Surgeon: Wylene Simmer, MD;  Location: [facility];  Service: Orthopedics;  Laterality: Right;   TOOTH EXTRACTION N/A 03/25/2019   Procedure: DENTAL EXTRACTIONS TEETH NUMBER THREE, FOUR, FIVE, SIX, SEVEN, NINE, TWELVE, FOURTEEN, THIRTY AND ALVEOLOPLASTY;  Surgeon: Diona Browner, DDS;  Location: [place] [facility];  Service: Oral Surgery;  Laterality: N/A;    Allergies  Allergies  Allergen Reactions   Shellfish Allergy Hives    History of Present Illness    Karen Ferguson is a 67 year old female with the above-mentioned past medical history who presents today for 4-week follow-up for 2D echo.  She was seen by Richardson Dopp, PA originally and December  2017 for complaint of palpitations.  She also noted significant fatigue and dyspnea requiring 3 pillows to sleep at night.  She wore Holter monitor for 48 hours and also had 2D echo performed and also carotid ultrasound.  Her Holter revealed overall normal study with no A-fib or pauses and one 4 beat run of NSVT.  The 2D echo completed and revealed EF of 60-65%, with no RWMA and grade 1 D. There was no mitral valve regurgitation noted.  She also had carotid ultrasounds performed that revealed 1-39% stenosis on the right and left ICA.    She was lost to follow-up in cardiology until recently seen in January 2023 by Dr. Percival Spanish following hospitalization for new onset atrial fibrillation.  She was admitted with complaint of abdominal pain and was found to have acute kidney injury secondary to poor p.o. intake.  She was started on metoprolol and Eliquis prior to discharge.  Patient was not taking her medications as reported and was advised to continue beta-blocker and Eliquis with a goal to discontinue once thyroid normalized. She was noted to have a systolic murmur and shortness of breath which required 2D echo for evaluation.  Echo was completed in [place] in January 2023 that revealed EF 66% with normal LV function and no LVH present.  She was seen in follow-up 10/31/2021 and was doing well with no new cardiac complaints.  She was in sinus rhythm  and denied any palpitations.  She was being followed by endocrinology for her thyroid.  She had a complaint of presyncope and sensation of losing balance and wore a 14-day ZIO monitor that showed sinus rhythm with 17 beats of SVT.  Patient was advised to resume metoprolol twice daily if symptomatic.   Since last being seen in the office patient reports***.  Patient denies chest pain, palpitations, dyspnea, PND, orthopnea, nausea, vomiting, dizziness, syncope, edema, weight gain, or early satiety.     ***Notes:  Home Medications    Current Outpatient Medications   Medication Sig Dispense Refill   acetaminophen (TYLENOL) 500 MG tablet Take 500 mg by mouth every 6 (six) hours as needed for mild pain. (Patient not taking: Reported on 05/27/2022)     albuterol (VENTOLIN HFA) 108 (90 Base) MCG/ACT inhaler INHALE 2 PUFFS INTO THE LUNGS FOUR TIMES DAILY AS NEEDED FOR SHORTNESS OF BREATH. 8.5 g 3   amLODipine (NORVASC) 10 MG tablet Take 10 mg by mouth daily.     apixaban (ELIQUIS) 5 MG TABS tablet Take 1 tablet (5 mg total) by mouth 2 (two) times daily. 60 tablet 2   diphenhydrAMINE (BENADRYL) 25 mg capsule Take 25 mg by mouth every 6 (six) hours as needed for itching. (Patient not taking: Reported on 05/27/2022)     methimazole (TAPAZOLE) 5 MG tablet Take 1 tablet (5 mg total) by mouth daily. 90 tablet 1   metoprolol tartrate (LOPRESSOR) 25 MG tablet Take 1 tablet (25 mg total) by mouth 2 (two) times daily. 180 tablet 3   omeprazole (PRILOSEC) 40 MG capsule Take 40 mg by mouth daily.     ondansetron (ZOFRAN) 4 MG tablet Take 1 tablet (4 mg total) by mouth every 8 (eight) hours as needed for nausea or vomiting. (Patient not taking: Reported on 05/27/2022) 15 tablet 0   pantoprazole (PROTONIX) 40 MG tablet Take 1 tablet (40 mg total) by mouth daily. (Patient not taking: Reported on 05/27/2022) 30 tablet 0   senna-docusate (SENOKOT-S) 8.6-50 MG tablet Take 1 tablet by mouth daily as needed for mild constipation. (Patient not taking: Reported on 05/27/2022) 30 tablet 1   No current facility-administered medications for this visit.     Review of Systems  Please see the history of present illness.    (+)*** (+)***  All other systems reviewed and are otherwise negative except as noted above.  Physical Exam    Wt Readings from Last 3 Encounters:  05/27/22 203 lb (92.1 kg)  10/31/21 191 lb 6.4 oz (86.8 kg)  09/10/21 182 lb 3.2 oz (82.6 kg)   [id number] were no vitals filed for this visit.,There is no height or weight on file to calculate BMI.  Constitutional:       Appearance: Healthy appearance. Not in distress.  Neck:     Vascular: JVD normal.  Pulmonary:     Effort: Pulmonary effort is normal.     Breath sounds: No wheezing. No rales. Diminished in the bases Cardiovascular:     Normal rate. Regular rhythm. Normal S1. Normal S2.      Murmurs: There is no murmur.  Edema:    Peripheral edema absent.  Abdominal:     Palpations: Abdomen is soft non tender. There is no hepatomegaly.  Skin:    General: Skin is warm and dry.  Neurological:     General: No focal deficit present.     Mental Status: Alert and oriented to person, place and time.     Cranial  Nerves: Cranial nerves are intact.  EKG/LABS/Other Studies Reviewed    ECG personally reviewed by me today - ***  Risk Assessment/Calculations:   {Does this patient have ATRIAL FIBRILLATION?:[id number]}        Lab Results  Component Value Date   WBC 5.9 06/23/2021   HGB 10.0 (L) 06/23/2021   HCT 29.7 (L) 06/23/2021   MCV 89.2 06/23/2021   PLT 203 06/23/2021   Lab Results  Component Value Date   CREATININE 0.96 06/23/2021   BUN 15 06/23/2021   NA 133 (L) 06/23/2021   K 3.3 (L) 06/23/2021   CL 100 06/23/2021   CO2 22 06/23/2021   Lab Results  Component Value Date   ALT 29 06/23/2021   AST 31 06/23/2021   ALKPHOS 79 06/23/2021   BILITOT 0.7 06/23/2021   Lab Results  Component Value Date   CHOL 164 01/10/2015   HDL 47 01/10/2015   LDLCALC 97 01/10/2015   TRIG 49 05/27/2021   CHOLHDL 3.5 01/10/2015    Lab Results  Component Value Date   HGBA1C 5.5 03/22/2019    Assessment & Plan    1.  Atrial fibrillation: -2D echo completed with EF of 60-65% -Patient with no complaints of palpitations chest pain -EKG today with sinus rhythm rate of 66 -Continue Eliquis 5 mg twice daily   2.  Presyncope: -Patient reports dizziness with ambulation and sensation of falling with loss of balance -We will have her wear a 14-day ZIO monitor to evaluate for arrhythmia and source of  presyncope   3.  Hyperthyroidism: -Currently followed by endocrinologist -Methimazole was recently reduced for better control   4.  Thyroid nodule: -Currently followed by endocrinologist with biopsy completed last month      Disposition: Follow-up with Minus Breeding, MD or APP in *** months {Are you ordering a CV Procedure (e.g. stress test, cath, DCCV, TEE, etc)?   Press F2        :[id number]   Medication Adjustments/Labs and Tests Ordered: Current medicines are reviewed at length with the patient today.  Concerns regarding medicines are outlined above.   Signed, Mable Fill, Marissa Nestle, NP 06/24/2022, 10:25 PM [facility]  Note:  This document was prepared using Dragon voice recognition software and may include unintentional dictation errors.

## 2022-06-25 ENCOUNTER — Ambulatory Visit: Payer: Medicare Other | Admitting: Nurse Practitioner

## 2022-06-25 DIAGNOSIS — E041 Nontoxic single thyroid nodule: Secondary | ICD-10-CM

## 2022-06-25 DIAGNOSIS — R55 Syncope and collapse: Secondary | ICD-10-CM

## 2022-06-25 DIAGNOSIS — E0590 Thyrotoxicosis, unspecified without thyrotoxic crisis or storm: Secondary | ICD-10-CM

## 2022-06-25 DIAGNOSIS — I480 Paroxysmal atrial fibrillation: Secondary | ICD-10-CM

## 2022-06-27 ENCOUNTER — Other Ambulatory Visit (INDEPENDENT_AMBULATORY_CARE_PROVIDER_SITE_OTHER): Payer: 59

## 2022-06-27 DIAGNOSIS — E0590 Thyrotoxicosis, unspecified without thyrotoxic crisis or storm: Secondary | ICD-10-CM

## 2022-06-27 NOTE — Addendum Note (Signed)
Addended by: Nils Flack I on: 06/27/2022 02:27 PM   Modules accepted: Orders

## 2022-06-28 LAB — T4, FREE: Free T4: 0.9 ng/dL (ref 0.8–1.8)

## 2022-06-28 LAB — TSH: TSH: 12.2 mIU/L — ABNORMAL HIGH (ref 0.40–4.50)

## 2022-07-01 NOTE — Progress Notes (Unsigned)
Office Visit    Patient Name: Karen Ferguson Date of Encounter: 07/01/2022  Primary Care Provider:  Myrtie Neither, PA-C Primary Cardiologist:  Minus Breeding, MD Primary Electrophysiologist: None  Chief Complaint   Karen Ferguson is a 67 y.o. female with PMH of HTN, OSA, atrial fibrillation on Eliquis, hypothyroidism, thyroid nodule, obesity and carotid artery stenosis who presents today for follow-up of hypertension and atrial fibrillation.   Past Medical History    Past Medical History:  Diagnosis Date   Arthritis    Asthma    Albuterol prn   Bronchitis    Degenerative disc disease    Depression    Diabetes mellitus without complication ([place])    Borderline diabetic in the past per pt   History of echocardiogram    Echo 1/18: EF 60-65, no RWMA, Gr 1 DD, PASP 20   Hypertension    PVC's (premature ventricular contractions)    Holter 12/17: NSR, occ PAC/PVCs, no AFib; one 4 beat run NSVT   Past Surgical History:  Procedure Laterality Date   ABDOMINAL HYSTERECTOMY  1988   for fibroid tumors   CARPAL TUNNEL RELEASE  1985   rt   [place] ARTHROSCOPY Right    TENDON REPAIR Right 03/03/2013   Procedure: RIGHT DEBRIDEMENT AND TENOLYSIS OF PERONEOUS LONGOUS AND BREVIS TENDONS ;  Surgeon: Wylene Simmer, MD;  Location: [facility];  Service: Orthopedics;  Laterality: Right;   TOOTH EXTRACTION N/A 03/25/2019   Procedure: DENTAL EXTRACTIONS TEETH NUMBER THREE, FOUR, FIVE, SIX, SEVEN, NINE, TWELVE, FOURTEEN, THIRTY AND ALVEOLOPLASTY;  Surgeon: Diona Browner, DDS;  Location: [facility];  Service: Oral Surgery;  Laterality: N/A;    Allergies  Allergies  Allergen Reactions   Shellfish Allergy Hives    History of Present Illness    Karen Ferguson is a 67 year old female with the above-mentioned past medical history who presents today for 4-week follow-up for 2D echo.  She was seen by Karen Dopp, PA originally and December  2017 for complaint of palpitations.  She also noted significant fatigue and dyspnea requiring 3 pillows to sleep at night.  She wore Holter monitor for 48 hours and also had 2D echo performed and also carotid ultrasound.  Her Holter revealed overall normal study with no A-fib or pauses and one 4 beat run of NSVT.  The 2D echo completed and revealed EF of 60-65%, with no RWMA and grade 1 D. There was no mitral valve regurgitation noted.  She also had carotid ultrasounds performed that revealed 1-39% stenosis on the right and left ICA.     She was lost to follow-up in cardiology until recently seen in January 2023 by Dr. Percival Spanish following hospitalization for new onset atrial fibrillation.  She was admitted with complaint of abdominal pain and was found to have acute kidney injury secondary to poor p.o. intake.  She was started on metoprolol and Eliquis prior to discharge.  Patient was not taking her medications as reported and was advised to continue beta-blocker and Eliquis with a goal to discontinue once thyroid normalized. She was noted to have a systolic murmur and shortness of breath which required 2D echo for evaluation.  Echo was completed in [place] in January 2023 that revealed EF 66% with normal LV function and no LVH present.  She was seen in follow-up 10/31/2021 and was doing well with no new cardiac complaints.  She was in sinus  rhythm and denied any palpitations.  She was being followed by endocrinology for her thyroid.  She had a complaint of presyncope and sensation of losing balance and wore a 14-day ZIO monitor that showed sinus rhythm with 17 beats of SVT.  Patient was advised to resume metoprolol twice daily if symptomatic.    Karen Ferguson presents today for follow-up alone.  Since last being seen in the office patient reports she has been doing well with no palpitations or chest pain.  Her blood pressure today is well-controlled at 124/72 and her heart rate is 66 bpm.  She is compliant with her  current medications and reports no adverse reactions.  She was seen recently by her PCP and her methimazole was reduced to 5 mg daily.  She is pending results of a recent thyroid biopsy.  She reports some occasional dizziness with standing and also reports that she has been tired lately.  She was treated for a URI and is currently on medications.  During our visit we also discussed the importance of primary and secondary prevention of progression of coronary artery disease.  He had all questions answered to her satisfaction.  Patient denies chest pain, palpitations, dyspnea, PND, orthopnea, nausea, vomiting, dizziness, syncope, edema, weight gain, or early satiety.   Home Medications    Current Outpatient Medications  Medication Sig Dispense Refill   acetaminophen (TYLENOL) 500 MG tablet Take 500 mg by mouth every 6 (six) hours as needed for mild pain. (Patient not taking: Reported on 05/27/2022)     albuterol (VENTOLIN HFA) 108 (90 Base) MCG/ACT inhaler INHALE 2 PUFFS INTO THE LUNGS FOUR TIMES DAILY AS NEEDED FOR SHORTNESS OF BREATH. 8.5 g 3   amLODipine (NORVASC) 10 MG tablet Take 10 mg by mouth daily.     apixaban (ELIQUIS) 5 MG TABS tablet Take 1 tablet (5 mg total) by mouth 2 (two) times daily. 60 tablet 2   diphenhydrAMINE (BENADRYL) 25 mg capsule Take 25 mg by mouth every 6 (six) hours as needed for itching. (Patient not taking: Reported on 05/27/2022)     methimazole (TAPAZOLE) 5 MG tablet Take 1 tablet (5 mg total) by mouth daily. 90 tablet 1   metoprolol tartrate (LOPRESSOR) 25 MG tablet Take 1 tablet (25 mg total) by mouth 2 (two) times daily. 180 tablet 3   omeprazole (PRILOSEC) 40 MG capsule Take 40 mg by mouth daily.     ondansetron (ZOFRAN) 4 MG tablet Take 1 tablet (4 mg total) by mouth every 8 (eight) hours as needed for nausea or vomiting. (Patient not taking: Reported on 05/27/2022) 15 tablet 0   pantoprazole (PROTONIX) 40 MG tablet Take 1 tablet (40 mg total) by mouth daily. (Patient not  taking: Reported on 05/27/2022) 30 tablet 0   senna-docusate (SENOKOT-S) 8.6-50 MG tablet Take 1 tablet by mouth daily as needed for mild constipation. (Patient not taking: Reported on 05/27/2022) 30 tablet 1   No current facility-administered medications for this visit.     Review of Systems  Please see the history of present illness.    (+) Dizziness (+) Fatigue and tiredness  All other systems reviewed and are otherwise negative except as noted above.  Physical Exam    Wt Readings from Last 3 Encounters:  05/27/22 203 lb (92.1 kg)  10/31/21 191 lb 6.4 oz (86.8 kg)  09/10/21 182 lb 3.2 oz (82.6 kg)   [id number] were no vitals filed for this visit.,There is no height or weight on file to calculate BMI.  Constitutional:      Appearance: Healthy appearance. Not in distress.  Neck:     Vascular: JVD normal.  Pulmonary:     Effort: Pulmonary effort is normal.     Breath sounds: No wheezing. No rales. Diminished in the bases Cardiovascular:     Normal rate. Regular rhythm. Normal S1. Normal S2.      Murmurs: There is no murmur.  Edema:    Peripheral edema absent.  Abdominal:     Palpations: Abdomen is soft non tender. There is no hepatomegaly.  Skin:    General: Skin is warm and dry.  Neurological:     General: No focal deficit present.     Mental Status: Alert and oriented to person, place and time.     Cranial Nerves: Cranial nerves are intact.  EKG/LABS/Other Studies Reviewed    ECG personally reviewed by me today -none completed today  Risk Assessment/Calculations:    CHA2DS2-VASc Score = 4   This indicates a 4.8% annual risk of stroke. The patient's score is based upon: CHF History: 0 HTN History: 1 Diabetes History: 0 Stroke History: 0 Vascular Disease History: 1 Age Score: 1 Gender Score: 1     Lab Results  Component Value Date   WBC 5.9 06/23/2021   HGB 10.0 (L) 06/23/2021   HCT 29.7 (L) 06/23/2021   MCV 89.2 06/23/2021   PLT 203 06/23/2021   Lab  Results  Component Value Date   CREATININE 0.96 06/23/2021   BUN 15 06/23/2021   NA 133 (L) 06/23/2021   K 3.3 (L) 06/23/2021   CL 100 06/23/2021   CO2 22 06/23/2021   Lab Results  Component Value Date   ALT 29 06/23/2021   AST 31 06/23/2021   ALKPHOS 79 06/23/2021   BILITOT 0.7 06/23/2021   Lab Results  Component Value Date   CHOL 164 01/10/2015   HDL 47 01/10/2015   LDLCALC 97 01/10/2015   TRIG 49 05/27/2021   CHOLHDL 3.5 01/10/2015    Lab Results  Component Value Date   HGBA1C 5.5 03/22/2019    Assessment & Plan    1.  Atrial fibrillation: -2D echo completed with EF of 60-65% -Patient with no complaints of palpitations chest pain today. -Most recent creatinine was 0.77 and patient's hemoglobin was 12.2 -Continue Eliquis 5 mg twice daily   2.  Presyncope: -ZIO monitor results show that patient had no evidence of tacky arrhythmias or pauses with predominant rhythm of sinus. -Patient reports that she is still experiencing occasional episode of dizziness with standing.  She has a history of carotid stenosis and we will send for updated carotid ultrasound.  3.  Hyperthyroidism: -Currently followed by endocrinologist -Methimazole was recently reduced for better control   4.  Thyroid nodule: -Currently followed by endocrinologist with biopsy completed last month  Disposition: Follow-up with Minus Breeding, MD or APP in 12 months   Medication Adjustments/Labs and Tests Ordered: Current medicines are reviewed at length with the patient today.  Concerns regarding medicines are outlined above.   Signed, Mable Fill, Marissa Nestle, NP 07/01/2022, 1:41 PM [facility]  Note:  This document was prepared using Dragon voice recognition software and may include unintentional dictation errors.

## 2022-07-02 ENCOUNTER — Ambulatory Visit: Payer: 59 | Attending: Nurse Practitioner | Admitting: Nurse Practitioner

## 2022-07-02 ENCOUNTER — Ambulatory Visit: Payer: Self-pay | Admitting: Physician Assistant

## 2022-07-02 ENCOUNTER — Encounter: Payer: Self-pay | Admitting: Nurse Practitioner

## 2022-07-02 VITALS — BP 124/72 | HR 66 | Ht 64.0 in | Wt 205.4 lb

## 2022-07-02 DIAGNOSIS — E041 Nontoxic single thyroid nodule: Secondary | ICD-10-CM

## 2022-07-02 DIAGNOSIS — I480 Paroxysmal atrial fibrillation: Secondary | ICD-10-CM | POA: Diagnosis not present

## 2022-07-02 DIAGNOSIS — I10 Essential (primary) hypertension: Secondary | ICD-10-CM

## 2022-07-02 DIAGNOSIS — R55 Syncope and collapse: Secondary | ICD-10-CM | POA: Diagnosis not present

## 2022-07-02 DIAGNOSIS — E0590 Thyrotoxicosis, unspecified without thyrotoxic crisis or storm: Secondary | ICD-10-CM

## 2022-07-02 NOTE — Patient Instructions (Addendum)
Medication Instructions:  Your physician recommends that you continue on your current medications as directed. Please refer to the Current Medication list given to you today. *If you need a refill on your cardiac medications before your next appointment, please call your pharmacy*  Lab Work: None ordered  Testing/Procedures: Your physician has requested that you have a carotid duplex. This test is an ultrasound of the carotid arteries in your neck. It looks at blood flow through these arteries that supply the brain with blood. Allow one hour for this exam. There are no restrictions or special instructions.  Follow-Up: At [facility], you and your health needs are our priority.  As part of our continuing mission to provide you with exceptional heart care, we have created designated Provider Care Teams.  These Care Teams include your primary Cardiologist (physician) and Advanced Practice Providers (APPs -  Physician Assistants and Nurse Practitioners) who all work together to provide you with the care you need, when you need it.  We recommend signing up for the patient portal called "MyChart".  Sign up information is provided on this After Visit Summary.  MyChart is used to connect with patients for Virtual Visits (Telemedicine).  Patients are able to view lab/test results, encounter notes, upcoming appointments, etc.  Non-urgent messages can be sent to your provider as well.   To learn more about what you can do with MyChart, go to [email or web address].    Your next appointment:   12 month(s)  Provider:   Minus Breeding, MD     Other Instructions

## 2022-07-28 ENCOUNTER — Ambulatory Visit (HOSPITAL_COMMUNITY)
Admission: RE | Admit: 2022-07-28 | Payer: 59 | Source: Ambulatory Visit | Attending: Nurse Practitioner | Admitting: Nurse Practitioner

## 2022-07-31 ENCOUNTER — Ambulatory Visit (HOSPITAL_COMMUNITY)
Admission: EM | Admit: 2022-07-31 | Discharge: 2022-07-31 | Disposition: A | Discharge status: Home / Self Care | Payer: 59 | Attending: Internal Medicine | Admitting: Internal Medicine

## 2022-07-31 ENCOUNTER — Encounter (HOSPITAL_COMMUNITY): Payer: Self-pay | Admitting: Emergency Medicine

## 2022-07-31 ENCOUNTER — Ambulatory Visit (INDEPENDENT_AMBULATORY_CARE_PROVIDER_SITE_OTHER): Payer: 59

## 2022-07-31 DIAGNOSIS — R062 Wheezing: Secondary | ICD-10-CM | POA: Diagnosis not present

## 2022-07-31 DIAGNOSIS — J209 Acute bronchitis, unspecified: Secondary | ICD-10-CM

## 2022-07-31 DIAGNOSIS — R0602 Shortness of breath: Secondary | ICD-10-CM | POA: Diagnosis not present

## 2022-07-31 MED ORDER — PREDNISONE 20 MG PO TABS: 40.0000 mg | ORAL_TABLET | Freq: Every day | ORAL | 0 refills | Status: AC

## 2022-07-31 MED ORDER — BENZONATATE 200 MG PO CAPS: 200.0000 mg | ORAL_CAPSULE | Freq: Three times a day (TID) | ORAL | 0 refills | Status: DC | PRN

## 2022-07-31 NOTE — Discharge Instructions (Addendum)
Please take medications as prescribed Chest x-ray is negative for pneumonia Humidifier and VapoRub use will help with cough and nasal congestion If you have worsening symptoms please return to the urgent care for further evaluation.

## 2022-07-31 NOTE — ED Provider Notes (Signed)
[facility]    CSN: [id number] Arrival date & time: 07/31/22  [id number]      History   Chief Complaint Chief Complaint  Patient presents with   Cough   Sore Throat   Chills    HPI Karen Ferguson is a 67 y.o. female comes to the urgent care with cough, chills, headache and increased fatigue over the past month.  Patient's symptoms started with nasal congestion, nasal drainage, cough and sore throat about a month ago.  Patient's symptoms improved except for cough and chills.  Over the past few to several days she started experiencing increased fatigue.  She has some shortness of breath on exertion.  No wheezing.  No fever.  No nausea, vomiting or diarrhea.  Patient is concerned about COVID and would like to have a COVID test.  It is not clear if the patient has recent history or remote history of asthma..  She currently uses albuterol inhaler on as needed basis. HPI  Past Medical History:  Diagnosis Date   Arthritis    Asthma    Albuterol prn   Bronchitis    Degenerative disc disease    Depression    Diabetes mellitus without complication ([place])    Borderline diabetic in the past per pt   History of echocardiogram    Echo 1/18: EF 60-65, no RWMA, Gr 1 DD, PASP 20   Hypertension    PVC's (premature ventricular contractions)    Holter 12/17: NSR, occ PAC/PVCs, no AFib; one 4 beat run NSVT    Patient Active Problem List   Diagnosis Date Noted   Murmur 11/30/2021   CKD (chronic kidney disease), stage II 11/30/2021   PAF (paroxysmal atrial fibrillation) ([place]) 06/19/2021   Hyperthyroidism    Nausea & vomiting    Thyroid nodule greater than or equal to 1 cm in diameter incidentally noted on imaging study    AKI (acute kidney injury) ([place]) 05/27/2021   Pancreatitis 05/27/2021   COVID-19 virus detected 05/27/2021   Atrial fibrillation with RVR ([place]) 05/27/2021   Abnormal TSH 05/27/2021   Chronic abdominal pain    Atrial fibrillation with rapid ventricular response ([place])  05/26/2021   Palpitations 04/01/2016   Family history of premature coronary heart disease 04/01/2016   Obstructive sleep apnea 12/28/2015   Bilateral leg numbness 07/03/2015   GERD (gastroesophageal reflux disease) 07/03/2015   Constipation 07/03/2015   Dizziness and giddiness 01/10/2015   Foot pain 01/10/2013   Tendon tear, ankle 01/10/2013   Cyst and pseudocyst of pancreas 03/16/2012   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   Degenerative disc disease    Asthma 06/10/2011   Obesity 10/17/2010   DENTAL CARIES 12/15/2008   OSTEOARTHRITIS, GENERALIZED, MULTIPLE JOINTS 12/15/2008   Essential hypertension 09/18/2007   OVERACTIVE BLADDER 04/29/2007   RECTAL BLEEDING 09/23/2006    Past Surgical History:  Procedure Laterality Date   ABDOMINAL HYSTERECTOMY  1988   for fibroid tumors   CARPAL TUNNEL RELEASE  1985   rt   [place] ARTHROSCOPY Right    TENDON REPAIR Right 03/03/2013   Procedure: RIGHT DEBRIDEMENT AND TENOLYSIS OF PERONEOUS LONGOUS AND BREVIS TENDONS ;  Surgeon: Wylene Simmer, MD;  Location: [facility];  Service: Orthopedics;  Laterality: Right;   TOOTH EXTRACTION N/A 03/25/2019   Procedure: DENTAL EXTRACTIONS TEETH NUMBER THREE, FOUR, FIVE, SIX, SEVEN, NINE, TWELVE, FOURTEEN, THIRTY AND ALVEOLOPLASTY;  Surgeon: Diona Browner, DDS;  Location: [facility];  Service: Oral Surgery;  Laterality: N/A;    OB History   No obstetric history on file.      Home Medications    Prior to Admission medications   Medication Sig Start Date End Date Taking? Authorizing Provider  benzonatate (TESSALON) 200 MG capsule Take 1 capsule (200 mg total) by mouth 3 (three) times daily as needed for cough. 07/31/22  Yes Prosper Paff, Myrene Galas, MD  predniSONE (DELTASONE) 20 MG tablet Take 2 tablets (40 mg total) by mouth daily for 5 days. 07/31/22 08/05/22 Yes Lavetta Geier, Myrene Galas, MD  acetaminophen (TYLENOL) 500 MG tablet Take 500 mg by mouth every 6  (six) hours as needed for mild pain.    [provider]  albuterol (VENTOLIN HFA) 108 (90 Base) MCG/ACT inhaler INHALE 2 PUFFS INTO THE LUNGS FOUR TIMES DAILY AS NEEDED FOR SHORTNESS OF BREATH. 06/21/20   Tisovec, Fransico Him, MD  apixaban (ELIQUIS) 5 MG TABS tablet Take 1 tablet (5 mg total) by mouth 2 (two) times daily. 05/30/21   Florencia Reasons, MD  methimazole (TAPAZOLE) 5 MG tablet Take 1 tablet (5 mg total) by mouth daily. 05/28/22   Shamleffer, Melanie Crazier, MD  metoprolol tartrate (LOPRESSOR) 25 MG tablet Take 1 tablet (25 mg total) by mouth 2 (two) times daily. 06/02/22 05/28/23  Marylu Lund., NP  omeprazole (PRILOSEC) 40 MG capsule Take 40 mg by mouth daily.    [provider]  pantoprazole (PROTONIX) 40 MG tablet Take 1 tablet (40 mg total) by mouth daily. 05/30/21   Florencia Reasons, MD    Family History Family History  Problem Relation Age of Onset   Thyroid disease Mother    Arthritis Mother    Hypertension Mother    Diabetes Mother    Kidney disease Mother    Arrhythmia Mother        AFib   Thyroid disease Sister    Arthritis Sister    Heart disease Sister    Hypertension Brother    Heart disease Maternal Grandfather    Heart attack Maternal Grandfather 25       MI   Heart disease Maternal Aunt    Heart failure Maternal Aunt    Heart disease Maternal Uncle    Cancer Maternal Uncle     Social History Social History   Tobacco Use   Smoking status: Never   Smokeless tobacco: Never  Vaping Use   Vaping Use: Never used  Substance Use Topics   Alcohol use: Yes    Comment: occasional   Drug use: No     Allergies   Shellfish allergy   Review of Systems Review of Systems As per HPI  Physical Exam Triage Vital Signs ED Triage Vitals  Enc Vitals Group     BP 07/31/22 1110 (!) 146/88     Pulse Rate 07/31/22 1110 67     Resp 07/31/22 1110 17     Temp 07/31/22 1110 98.5 F (36.9 C)     Temp Source 07/31/22 1110 Oral     SpO2 07/31/22 1110 97 %      Weight --      Height --      Head Circumference --      Peak Flow --      Pain Score 07/31/22 1109 0     Pain Loc --      Pain Edu? --      Excl. in [place]? --    No data found.  Updated Vital  Signs BP (!) 146/88 (BP Location: Right Arm)   Pulse 67   Temp 98.5 F (36.9 C) (Oral)   Resp 17   SpO2 97%   Visual Acuity Right Eye Distance:   Left Eye Distance:   Bilateral Distance:    Right Eye Near:   Left Eye Near:    Bilateral Near:     Physical Exam Vitals and nursing note reviewed.  Constitutional:      General: She is not in acute distress.    Appearance: She is not ill-appearing.  HENT:     Mouth/Throat:     Mouth: Mucous membranes are moist.     Pharynx: No posterior oropharyngeal erythema.     Tonsils: No tonsillar exudate or tonsillar abscesses.  Cardiovascular:     Rate and Rhythm: Normal rate and regular rhythm.     Heart sounds: Normal heart sounds.  Pulmonary:     Effort: Pulmonary effort is normal.     Breath sounds: Wheezing and rales present.  Neurological:     Mental Status: She is alert.      UC Treatments / Results  Labs (all labs ordered are listed, but only abnormal results are displayed) Labs Reviewed - No data to display  EKG   Radiology DG Chest 2 View  Result Date: 07/31/2022 CLINICAL DATA:  Shortness of breath, wheezing EXAM: CHEST - 2 VIEW COMPARISON:  06/23/2021 FINDINGS: Transverse diameter of heart is slightly increased. There are no signs of pulmonary edema or focal pulmonary consolidation. There is no pleural effusion or pneumothorax. Surgical clips are seen in right upper quadrant of abdomen. IMPRESSION: There are no signs of pulmonary edema or new focal infiltrates. Electronically Signed   By: Elmer Picker M.D.   On: 07/31/2022 12:10    Procedures Procedures (including critical care time)  Medications Ordered in UC Medications - No data to display  Initial Impression / Assessment and Plan / UC Course  I have  reviewed the triage vital signs and the nursing notes.  Pertinent labs & imaging results that were available during my care of the patient were reviewed by me and considered in my medical decision making (see chart for details).     1.  Acute bronchitis with bronchospasm: Chest x-ray is negative for pneumonia Humidifier and VapoRub use recommended Patient is advised to continue using albuterol inhaler Prednisone 40 mg orally daily for 5 days Tessalon Perles as needed for cough Return precautions given  Final Clinical Impressions(s) / UC Diagnoses   Final diagnoses:  Acute bronchitis with bronchospasm     Discharge Instructions      Please take medications as prescribed Chest x-ray is negative for pneumonia Humidifier and VapoRub use will help with cough and nasal congestion If you have worsening symptoms please return to the urgent care for further evaluation.     ED Prescriptions     Medication Sig Dispense Auth. Provider   predniSONE (DELTASONE) 20 MG tablet Take 2 tablets (40 mg total) by mouth daily for 5 days. 10 tablet Zailen Albarran, Myrene Galas, MD   benzonatate (TESSALON) 200 MG capsule Take 1 capsule (200 mg total) by mouth 3 (three) times daily as needed for cough. 21 capsule Estus Krakowski, Myrene Galas, MD      PDMP not reviewed this encounter.   Chase Picket, MD 08/01/22 1325

## 2022-07-31 NOTE — ED Triage Notes (Signed)
Pt c/o headache, cough, chills for about a month,. Would like covid test. Took OTC sinus pills and tylenol.

## 2022-08-01 ENCOUNTER — Ambulatory Visit (HOSPITAL_COMMUNITY)
Admission: RE | Admit: 2022-08-01 | Discharge: 2022-08-01 | Disposition: A | Discharge status: Home / Self Care | Payer: 59 | Source: Ambulatory Visit | Attending: Cardiovascular Disease | Admitting: Cardiovascular Disease

## 2022-08-01 DIAGNOSIS — E0590 Thyrotoxicosis, unspecified without thyrotoxic crisis or storm: Secondary | ICD-10-CM

## 2022-08-01 DIAGNOSIS — I480 Paroxysmal atrial fibrillation: Secondary | ICD-10-CM

## 2022-08-01 DIAGNOSIS — I10 Essential (primary) hypertension: Secondary | ICD-10-CM | POA: Diagnosis not present

## 2022-08-01 DIAGNOSIS — R55 Syncope and collapse: Secondary | ICD-10-CM | POA: Diagnosis not present

## 2022-08-01 DIAGNOSIS — E041 Nontoxic single thyroid nodule: Secondary | ICD-10-CM | POA: Diagnosis present

## 2022-12-02 ENCOUNTER — Encounter: Payer: Self-pay | Admitting: Internal Medicine

## 2022-12-02 ENCOUNTER — Ambulatory Visit (INDEPENDENT_AMBULATORY_CARE_PROVIDER_SITE_OTHER): Payer: 59 | Admitting: Internal Medicine

## 2022-12-02 VITALS — BP 136/80 | HR 73 | Ht 64.0 in | Wt 202.6 lb

## 2022-12-02 DIAGNOSIS — E0590 Thyrotoxicosis, unspecified without thyrotoxic crisis or storm: Secondary | ICD-10-CM

## 2022-12-02 DIAGNOSIS — E042 Nontoxic multinodular goiter: Secondary | ICD-10-CM

## 2022-12-02 NOTE — Progress Notes (Unsigned)
Name: Bryanna Wassenberg [facility]  MRN/ DOB: [id number], Aug 14, 1955    Age/ Sex: 67 y.o., female     PCP: Modesta Messing   Reason for Endocrinology Evaluation: Hyperthyroidism     Initial Endocrinology Clinic Visit: 06/05/2021    PATIENT IDENTIFIER: Karen Ferguson is a 67 y.o., female with a past medical history of HTN, A.Fib, MNG. She has followed with [facility] since 06/05/2021 for consultative assistance with management of her hyperthyroidism.   HISTORICAL SUMMARY: The patient was first diagnosed with hyperthyroidism in 05/2021 with a suppressed TSH <0.01 uIU/mL and elevated FT4 at 2.91 ng/dL   She was started on Methimazole 05/2021  Thyroid ultrasound 05/2021 revealed MNG. She is S/P benign FNA of the isthmic 2.5 cm nodule ( Bethesda category II) on 08/26/2021   Sister with thyroid disease    She was seen by Dr. Everardo All from January 2023 until April 2023 SUBJECTIVE:    Today (12/02/2022):  Ms. Valois is here for a follow up on hyperthyroidism and MNG  Pt has been noted with weight loss  Denies local neck swelling  Has occasional palpitations  Hands are unsteady  Continue  chronic constipation - Linzess caused weakness    Methimazole 5 mg daily- she still has 10 mg ( half a tablet daily )  HISTORY:  Past Medical History:  Past Medical History:  Diagnosis Date   Arthritis    Asthma    Albuterol prn   Bronchitis    Degenerative disc disease    Depression    Diabetes mellitus without complication ([facility])    Borderline diabetic in the past per pt   History of echocardiogram    Echo 1/18: EF 60-65, no RWMA, Gr 1 DD, PASP 20   Hypertension    PVC's (premature ventricular contractions)    Holter 12/17: NSR, occ PAC/PVCs, no AFib; one 4 beat run NSVT   Past Surgical History:  Past Surgical History:  Procedure Laterality Date   ABDOMINAL HYSTERECTOMY  1988   for fibroid tumors   CARPAL TUNNEL RELEASE  1985   rt   CHOLECYSTECTOMY  1989   HEMORRHOID SURGERY   1986   [place] ARTHROSCOPY Right    TENDON REPAIR Right 03/03/2013   Procedure: RIGHT DEBRIDEMENT AND TENOLYSIS OF PERONEOUS LONGOUS AND BREVIS TENDONS ;  Surgeon: Toni Arthurs, MD;  Location: [facility];  Service: Orthopedics;  Laterality: Right;   TOOTH EXTRACTION N/A 03/25/2019   Procedure: DENTAL EXTRACTIONS TEETH NUMBER THREE, FOUR, FIVE, SIX, SEVEN, NINE, TWELVE, FOURTEEN, THIRTY AND ALVEOLOPLASTY;  Surgeon: Ocie Doyne, DDS;  Location: [facility];  Service: Oral Surgery;  Laterality: N/A;   Social History:  reports that she has never smoked. She has never used smokeless tobacco. She reports current alcohol use. She reports that she does not use drugs. Family History:  Family History  Problem Relation Age of Onset   Thyroid disease Mother    Arthritis Mother    Hypertension Mother    Diabetes Mother    Kidney disease Mother    Arrhythmia Mother        AFib   Thyroid disease Sister    Arthritis Sister    Heart disease Sister    Hypertension Brother    Heart disease Maternal Grandfather    Heart attack Maternal Grandfather 32       MI   Heart disease Maternal Aunt    Heart failure Maternal Aunt    Heart disease Maternal Uncle  Cancer Maternal Uncle      HOME MEDICATIONS: Allergies as of 12/02/2022       Reactions   Shellfish Allergy Hives        Medication List        Accurate as of December 02, 2022  3:03 PM. If you have any questions, ask your nurse or doctor.          acetaminophen 500 MG tablet Commonly known as: TYLENOL Take 500 mg by mouth every 6 (six) hours as needed for mild pain.   albuterol 108 (90 Base) MCG/ACT inhaler Commonly known as: VENTOLIN HFA INHALE 2 PUFFS INTO THE LUNGS FOUR TIMES DAILY AS NEEDED FOR SHORTNESS OF BREATH.   benzonatate 200 MG capsule Commonly known as: TESSALON Take 1 capsule (200 mg total) by mouth 3 (three) times daily as needed for cough.   Eliquis 5 MG Tabs tablet Generic drug: apixaban Take 1 tablet (5  mg total) by mouth 2 (two) times daily.   methimazole 5 MG tablet Commonly known as: TAPAZOLE Take 1 tablet (5 mg total) by mouth daily.   metoprolol tartrate 25 MG tablet Commonly known as: LOPRESSOR Take 1 tablet (25 mg total) by mouth 2 (two) times daily.   omeprazole 40 MG capsule Commonly known as: PRILOSEC Take 40 mg by mouth daily.   pantoprazole 40 MG tablet Commonly known as: PROTONIX Take 1 tablet (40 mg total) by mouth daily.          OBJECTIVE:   PHYSICAL EXAM: VS: BP 136/80 (BP Location: Left Arm, Patient Position: Sitting, Cuff Size: Large)   Pulse 73   Ht 5\' 4"  (1.626 m)   Wt 202 lb 9.6 oz (91.9 kg)   SpO2 91%   BMI 34.78 kg/m    EXAM: General: Pt appears well and is in NAD  Eyes: External eye exam normal without stare, or exophthalmos.   Neck: General: Supple without adenopathy. Thyroid: Thyroid  nodules appreciated  Lungs: Clear with good BS bilat with no rales, rhonchi, or wheezes  Heart: Auscultation: RRR.  Extremities:  BL LE: No pretibial edema   Mental Status: Judgment, insight: Intact Orientation: Oriented to time, place, and person Mood and affect: No depression, anxiety, or agitation     DATA REVIEWED:   Latest Reference Range & Units 12/02/22 15:14  TSH 0.35 - 5.50 uIU/mL 5.15  T4,Free(Direct) 0.60 - 1.60 ng/dL 0.98     Thyroid Ultrasound 06/06/2022  Estimated total number of nodules >/= 1 cm: 1   Number of spongiform nodules >/=  2 cm not described below (TR1): 0   Number of mixed cystic and solid nodules >/= 1.5 cm not described below (TR2): 0   _________________________________________________________   Nodule # 1: The previously biopsied isthmic nodule has changed in configuration since the time of biopsy. The cystic component has largely resolved and the nodule appears more solid and difficult to discern from the background parenchyma. The nodule measures grossly 2.7 x 2.0 x 2.3 cm which is insignificantly changed  compared to prior.   The isthmus and left aspect of the thyroid gland are diffusely replaced by lobular goitrous tissue. No definite discrete nodule is identified on the left. The normal thyroid parenchyma on the right is relatively hypoechoic and diffusely heterogeneous.   IMPRESSION: 1. Markedly enlarged and heterogeneous thyroid gland with diffuse nodular goitrous replacement of the entire isthmus and left gland. 2. The previously biopsied nodule in the thyroid isthmus has changed in character. The cystic component has  resolved in the nodule is much less well-defined on today's study blending in with the background parenchyma. Recommend correlation with prior biopsy results. 3. Diffuse lobular goitrous enlargement of the entire left gland yielding a pseudo nodular appearance.     FNA isthmic nodule 08/26/2021  Clinical History: Isthmus; Superior 2.5cm' Other 2 dimensions: 1.5 x  1.9cm, Solid / almost completely solid, Isoechoic, TI-RADS total points  6  Specimen Submitted:  A. THYROID, SUPERIOR ISTHMUS, FINE NEEDLE  ASPIRATION:    FINAL MICROSCOPIC DIAGNOSIS:  - Consistent with benign follicular nodule (Bethesda category II)     ASSESSMENT / PLAN / RECOMMENDATIONS:   Hyperthyroidism  -Patient is clinically euthyroid -Tolerating methimazole without side effects - TFT's today show improvement in TSH, but remains elevated.  Free T4 is normal. -The patient continues to use half a tablet of 10 mg tablets of methimazole, will decrease the dose as below   Medications   Decrease methimazole 5 mg daily Monday through Saturday, none on Sundays   2.  Multinodular goiter:  -No local neck symptoms -She is s/p benign FNA of the isthmic 2.5 cm nodule 08/2021 -Thyroid ultrasound 05/2022 showed resolution of the cystic component of the nodule, will recheck 05/2023  Follow-up in 6 months    Signed electronically by: Lyndle Herrlich, MD  [facility] Endocrinology  [facility] [address] [place] [place], [place] [address] Phone: [phone] FAX: [phone]      CC: Modesta Messing [address] [place] [address] Phone: [phone]  Fax: [phone]   Return to Endocrinology clinic as below: No future appointments.

## 2022-12-03 ENCOUNTER — Telehealth: Payer: Self-pay | Admitting: Internal Medicine

## 2022-12-03 LAB — T4, FREE: Free T4: 0.77 ng/dL (ref 0.60–1.60)

## 2022-12-03 LAB — TSH: TSH: 5.15 u[IU]/mL (ref 0.35–5.50)

## 2022-12-03 NOTE — Telephone Encounter (Signed)
Karen Ferguson is aware of recommendations

## 2022-12-03 NOTE — Telephone Encounter (Signed)
Please let the patient know that her thyroid function has improved but there is room to decrease methimazole as below.   Take methimazole 5 mg Monday through Saturday, NONe on Sundays from now on    Thanks

## 2022-12-19 ENCOUNTER — Encounter (HOSPITAL_COMMUNITY): Payer: Self-pay

## 2022-12-19 ENCOUNTER — Ambulatory Visit (HOSPITAL_COMMUNITY)
Admission: EM | Admit: 2022-12-19 | Discharge: 2022-12-19 | Disposition: A | Discharge status: Home / Self Care | Payer: 59 | Source: Home / Self Care

## 2022-12-19 DIAGNOSIS — R42 Dizziness and giddiness: Secondary | ICD-10-CM | POA: Insufficient documentation

## 2022-12-19 DIAGNOSIS — K625 Hemorrhage of anus and rectum: Secondary | ICD-10-CM | POA: Diagnosis not present

## 2022-12-19 DIAGNOSIS — Z1152 Encounter for screening for COVID-19: Secondary | ICD-10-CM | POA: Insufficient documentation

## 2022-12-19 DIAGNOSIS — R5383 Other fatigue: Secondary | ICD-10-CM | POA: Diagnosis not present

## 2022-12-19 LAB — COMPREHENSIVE METABOLIC PANEL
ALT: 27 U/L (ref 0–44)
AST: 36 U/L (ref 15–41)
Albumin: 3.3 g/dL — ABNORMAL LOW (ref 3.5–5.0)
Alkaline Phosphatase: 75 U/L (ref 38–126)
Anion gap: 7 (ref 5–15)
BUN: 9 mg/dL (ref 8–23)
CO2: 30 mmol/L (ref 22–32)
Calcium: 9.1 mg/dL (ref 8.9–10.3)
Chloride: 99 mmol/L (ref 98–111)
Creatinine, Ser: 0.92 mg/dL (ref 0.44–1.00)
GFR, Estimated: >60 mL/min (ref >60)
Glucose, Bld: 108 mg/dL — ABNORMAL HIGH (ref 70–99)
Potassium: 4.4 mmol/L (ref 3.5–5.1)
Sodium: 136 mmol/L (ref 135–145)
Total Bilirubin: 0.3 mg/dL (ref 0.3–1.2)
Total Protein: 7.4 g/dL (ref 6.5–8.1)

## 2022-12-19 LAB — CBC WITH DIFFERENTIAL/PLATELET
Abs Immature Granulocytes: 0.02 10*3/uL (ref 0.00–0.07)
Basophils Absolute: 0 10*3/uL (ref 0.0–0.1)
Basophils Relative: 0 %
Eosinophils Absolute: 0.1 10*3/uL (ref 0.0–0.5)
Eosinophils Relative: 2 %
HCT: 36.6 % (ref 36.0–46.0)
Hemoglobin: 11.8 g/dL — ABNORMAL LOW (ref 12.0–15.0)
Immature Granulocytes: 0 %
Lymphocytes Relative: 46 %
Lymphs Abs: 2.2 10*3/uL (ref 0.7–4.0)
MCH: 30.3 pg (ref 26.0–34.0)
MCHC: 32.2 g/dL (ref 30.0–36.0)
MCV: 93.8 fL (ref 80.0–100.0)
Monocytes Absolute: 0.7 10*3/uL (ref 0.1–1.0)
Monocytes Relative: 15 %
Neutro Abs: 1.7 10*3/uL (ref 1.7–7.7)
Neutrophils Relative %: 37 %
Platelets: 258 10*3/uL (ref 150–400)
RBC: 3.9 MIL/uL (ref 3.87–5.11)
RDW: 14.6 % (ref 11.5–15.5)
WBC: 4.7 10*3/uL (ref 4.0–10.5)
nRBC: 0 % (ref 0.0–0.2)

## 2022-12-19 LAB — POCT FASTING CBG KUC MANUAL ENTRY: POCT Glucose (KUC): 94 mg/dL (ref 70–99)

## 2022-12-19 NOTE — Discharge Instructions (Signed)
We have swabbed you for COVID-19 today and we will contact you if the results are positive.  We are also checking your electrolytes and hemoglobin level, our staff will contact if there is anything emergent.  Please ensure you are drinking at least 64 ounces of water, you can also consider increasing your electrolyte intake and adding a Gatorade, liquid IV or Pedialyte.  Ensure you are eating a balanced diet.   Please seek immediate care if you develop loss of consciousness, abdominal pain, dark red blood in your stool, you fall and hit your head, or you have any new concerning symptoms.

## 2022-12-19 NOTE — ED Triage Notes (Signed)
Pt c/o lightheaded, headache, chills, and night sweats since Monday. Denies taking meds.

## 2022-12-19 NOTE — ED Provider Notes (Signed)
[facility]    CSN: [id number] Arrival date & time: 12/19/22  1900      History   Chief Complaint Chief Complaint  Patient presents with   Dizziness    HPI DELLAH TISLER is a 67 y.o. female.   Patient presents to clinic for lightheadedness, headache, chills, night sweats and feeling unwell since Monday.  She reports overall diminished appetite.  She has not taken any medication for her symptoms.  She denies any recent known sick contacts.  Reports her blood pressure was elevated this morning and after she took her metoprolol.  She is normotensive in clinic.  Reports when she has felt this way in the past that has been her thyroid.  She did recently have normal thyroid function around 2 weeks ago at her primary care provider and she is on methimazole for abnormal TSH.   She denies any dysuria or frequency.  No fevers or flank pain.  Denies any chest pain, wheezing or shortness of breath.  No nasal congestion.      The history is provided by the patient and medical records.  Dizziness Associated symptoms: headaches   Associated symptoms: no chest pain, no diarrhea, no nausea, no shortness of breath and no vomiting     Past Medical History:  Diagnosis Date   Arthritis    Asthma    Albuterol prn   Bronchitis    Degenerative disc disease    Depression    Diabetes mellitus without complication (HCC)    Borderline diabetic in the past per pt   History of echocardiogram    Echo 1/18: EF 60-65, no RWMA, Gr 1 DD, PASP 20   Hypertension    PVC's (premature ventricular contractions)    Holter 12/17: NSR, occ PAC/PVCs, no AFib; one 4 beat run NSVT    Patient Active Problem List   Diagnosis Date Noted   Murmur 11/30/2021   CKD (chronic kidney disease), stage II 11/30/2021   PAF (paroxysmal atrial fibrillation) (HCC) 06/19/2021   Hyperthyroidism    Nausea & vomiting    Thyroid nodule greater than or equal to 1 cm in diameter incidentally noted on imaging  study    AKI (acute kidney injury) (HCC) 05/27/2021   Pancreatitis 05/27/2021   COVID-19 virus detected 05/27/2021   Atrial fibrillation with RVR (HCC) 05/27/2021   Abnormal TSH 05/27/2021   Chronic abdominal pain    Atrial fibrillation with rapid ventricular response (HCC) 05/26/2021   Palpitations 04/01/2016   Family history of premature coronary heart disease 04/01/2016   Obstructive sleep apnea 12/28/2015   Bilateral leg numbness 07/03/2015   GERD (gastroesophageal reflux disease) 07/03/2015   Constipation 07/03/2015   Dizziness and giddiness 01/10/2015   Foot pain 01/10/2013   Tendon tear, ankle 01/10/2013   Cyst and pseudocyst of pancreas 03/16/2012   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   Degenerative disc disease    Asthma 06/10/2011   Obesity 10/17/2010   DENTAL CARIES 12/15/2008   OSTEOARTHRITIS, GENERALIZED, MULTIPLE JOINTS 12/15/2008   Essential hypertension 09/18/2007   OVERACTIVE BLADDER 04/29/2007   RECTAL BLEEDING 09/23/2006    Past Surgical History:  Procedure Laterality Date   ABDOMINAL HYSTERECTOMY  1988   for fibroid tumors   CARPAL TUNNEL RELEASE  1985   rt   CHOLECYSTECTOMY  1989   HEMORRHOID SURGERY  1986   KNEE ARTHROSCOPY Right    TENDON REPAIR Right 03/03/2013   Procedure: RIGHT DEBRIDEMENT AND TENOLYSIS OF PERONEOUS LONGOUS AND BREVIS TENDONS ;  Surgeon:  Toni Arthurs, MD;  Location: [facility];  Service: Orthopedics;  Laterality: Right;   TOOTH EXTRACTION N/A 03/25/2019   Procedure: DENTAL EXTRACTIONS TEETH NUMBER THREE, FOUR, FIVE, SIX, SEVEN, NINE, TWELVE, FOURTEEN, THIRTY AND ALVEOLOPLASTY;  Surgeon: Ocie Doyne, DDS;  Location: [facility];  Service: Oral Surgery;  Laterality: N/A;    OB History   No obstetric history on file.      Home Medications    Prior to Admission medications   Medication Sig Start Date End Date Taking? Authorizing Provider  acetaminophen (TYLENOL) 500 MG tablet Take 500 mg by mouth every 6 (six) hours  as needed for mild pain.    [provider]  albuterol (VENTOLIN HFA) 108 (90 Base) MCG/ACT inhaler INHALE 2 PUFFS INTO THE LUNGS FOUR TIMES DAILY AS NEEDED FOR SHORTNESS OF BREATH. 06/21/20   Tisovec, Adelfa Koh, MD  apixaban (ELIQUIS) 5 MG TABS tablet Take 1 tablet (5 mg total) by mouth 2 (two) times daily. 05/30/21   Albertine Grates, MD  benzonatate (TESSALON) 200 MG capsule Take 1 capsule (200 mg total) by mouth 3 (three) times daily as needed for cough. Patient not taking: Reported on 12/02/2022 07/31/22   Merrilee Jansky, MD  methimazole (TAPAZOLE) 5 MG tablet Take 1 tablet (5 mg total) by mouth daily. 05/28/22   Shamleffer, Konrad Dolores, MD  metoprolol tartrate (LOPRESSOR) 25 MG tablet Take 1 tablet (25 mg total) by mouth 2 (two) times daily. 06/02/22 05/28/23  Gaston Islam., NP  omeprazole (PRILOSEC) 40 MG capsule Take 40 mg by mouth daily.    [provider]  pantoprazole (PROTONIX) 40 MG tablet Take 1 tablet (40 mg total) by mouth daily. 05/30/21   Albertine Grates, MD    Family History Family History  Problem Relation Age of Onset   Thyroid disease Mother    Arthritis Mother    Hypertension Mother    Diabetes Mother    Kidney disease Mother    Arrhythmia Mother        AFib   Thyroid disease Sister    Arthritis Sister    Heart disease Sister    Hypertension Brother    Heart disease Maternal Grandfather    Heart attack Maternal Grandfather 45       MI   Heart disease Maternal Aunt    Heart failure Maternal Aunt    Heart disease Maternal Uncle    Cancer Maternal Uncle     Social History Social History   Tobacco Use   Smoking status: Never   Smokeless tobacco: Never  Vaping Use   Vaping status: Never Used  Substance Use Topics   Alcohol use: Yes    Comment: occasional   Drug use: No     Allergies   Shellfish allergy   Review of Systems Review of Systems  Constitutional:  Positive for appetite change, chills and fever.  HENT:  Negative for congestion and  sore throat.   Respiratory:  Negative for cough and shortness of breath.   Cardiovascular:  Negative for chest pain.  Gastrointestinal:  Negative for abdominal pain, diarrhea, nausea and vomiting.  Musculoskeletal:  Positive for arthralgias.  Neurological:  Positive for dizziness, light-headedness and headaches.     Physical Exam Triage Vital Signs ED Triage Vitals  Encounter Vitals Group     BP 12/19/22 1934 129/74     Systolic BP Percentile --      Diastolic BP Percentile --      Pulse Rate 12/19/22  1934 68     Resp 12/19/22 1934 18     Temp 12/19/22 1934 98.8 F (37.1 C)     Temp Source 12/19/22 1934 Oral     SpO2 12/19/22 1934 93 %     Weight --      Height --      Head Circumference --      Peak Flow --      Pain Score 12/19/22 1935 0     Pain Loc --      Pain Education --      Exclude from Growth Chart --    No data found.  Updated Vital Signs BP 129/74 (BP Location: Left Arm)   Pulse 68   Temp 98.8 F (37.1 C) (Oral)   Resp 18   SpO2 93%   Visual Acuity Right Eye Distance:   Left Eye Distance:   Bilateral Distance:    Right Eye Near:   Left Eye Near:    Bilateral Near:     Physical Exam Vitals and nursing note reviewed.  Constitutional:      Appearance: Normal appearance.  HENT:     Head: Normocephalic and atraumatic.     Right Ear: External ear normal.     Left Ear: External ear normal.     Nose: Nose normal.     Mouth/Throat:     Mouth: Mucous membranes are moist.  Eyes:     Conjunctiva/sclera: Conjunctivae normal.  Cardiovascular:     Rate and Rhythm: Normal rate and regular rhythm.     Heart sounds: Normal heart sounds. No murmur heard. Pulmonary:     Effort: Pulmonary effort is normal. No respiratory distress.     Breath sounds: Normal breath sounds.  Musculoskeletal:        General: Normal range of motion.     Cervical back: Normal range of motion.  Skin:    General: Skin is warm and dry.  Neurological:     General: No focal  deficit present.     Mental Status: She is alert and oriented to person, place, and time.  Psychiatric:        Mood and Affect: Mood normal.        Behavior: Behavior normal. Behavior is cooperative.      UC Treatments / Results  Labs (all labs ordered are listed, but only abnormal results are displayed) Labs Reviewed  SARS CORONAVIRUS 2 (TAT 6-24 HRS)  CBC WITH DIFFERENTIAL/PLATELET  COMPREHENSIVE METABOLIC PANEL  POCT FASTING CBG KUC MANUAL ENTRY    EKG   Radiology No results found.  Procedures Procedures (including critical care time)  Medications Ordered in UC Medications - No data to display  Initial Impression / Assessment and Plan / UC Course  I have reviewed the triage vital signs and the nursing notes.  Pertinent labs & imaging results that were available during my care of the patient were reviewed by me and considered in my medical decision making (see chart for details).  Vitals and triage reviewed, patient is hemodynamically stable.  Reports multiple symptoms that are vague.  Overall physical exam is reassuring, lungs are vesicular posteriorly, heart with regular rate and rhythm.  Endorses dizziness with position changes and diminished appetite, CBG is within normal limits.  COVID-19 testing obtained today.  Patient also endorses intermittent blood in stool, does have hemorrhoids and takes Eliquis.  Reports sometimes blood is dark and sometimes light.  Will check CBC and CMP today in clinic, staff to contact  if anything requires emergent follow-up.  Given strict emergency precautions.  Plan of care, follow-up care and return precautions reviewed, no questions at this time.    Final Clinical Impressions(s) / UC Diagnoses   Final diagnoses:  Other fatigue  Rectal bleeding  Lightheadedness     Discharge Instructions      We have swabbed you for COVID-19 today and we will contact you if the results are positive.  We are also checking your electrolytes and  hemoglobin level, our staff will contact if there is anything emergent.  Please ensure you are drinking at least 64 ounces of water, you can also consider increasing your electrolyte intake and adding a Gatorade, liquid IV or Pedialyte.  Ensure you are eating a balanced diet.   Please seek immediate care if you develop loss of consciousness, abdominal pain, dark red blood in your stool, you fall and hit your head, or you have any new concerning symptoms.      ED Prescriptions   None    PDMP not reviewed this encounter.   Mccabe Gloria, [place] N, [place] 12/19/22 2009

## 2023-04-22 ENCOUNTER — Other Ambulatory Visit: Payer: Self-pay

## 2023-04-22 ENCOUNTER — Ambulatory Visit (INDEPENDENT_AMBULATORY_CARE_PROVIDER_SITE_OTHER): Payer: 59

## 2023-04-22 ENCOUNTER — Ambulatory Visit (HOSPITAL_COMMUNITY)
Admission: EM | Admit: 2023-04-22 | Discharge: 2023-04-22 | Disposition: A | Discharge status: Home / Self Care | Payer: 59 | Attending: Family Medicine | Admitting: Family Medicine

## 2023-04-22 ENCOUNTER — Encounter (HOSPITAL_COMMUNITY): Payer: Self-pay | Admitting: Emergency Medicine

## 2023-04-22 DIAGNOSIS — R053 Chronic cough: Secondary | ICD-10-CM | POA: Diagnosis not present

## 2023-04-22 DIAGNOSIS — J4521 Mild intermittent asthma with (acute) exacerbation: Secondary | ICD-10-CM

## 2023-04-22 MED ORDER — HYDROCODONE BIT-HOMATROP MBR 5-1.5 MG/5ML PO SOLN: 5.0000 mL | Freq: Four times a day (QID) | ORAL | 0 refills | Status: DC | PRN

## 2023-04-22 MED ORDER — AZITHROMYCIN 250 MG PO TABS: 250.0000 mg | ORAL_TABLET | Freq: Every day | ORAL | 0 refills | Status: DC

## 2023-04-22 MED ORDER — PREDNISONE 20 MG PO TABS: 40.0000 mg | ORAL_TABLET | Freq: Every day | ORAL | 0 refills | Status: DC

## 2023-04-22 NOTE — Discharge Instructions (Signed)
Be aware, your cough medication may cause drowsiness. Please do not drive, operate heavy machinery or make important decisions while on this medication, it can cloud your judgement.

## 2023-04-22 NOTE — ED Triage Notes (Signed)
Patient reports symptoms started 2 weeks ago, runny nose, coughing, chest congestion, throat congestion, and headache.    Has not taken any medications for these symptoms.    Patient reports she has a daily inhaler, but does not think this is helping

## 2023-04-22 NOTE — ED Provider Notes (Signed)
[facility]   [id number] 04/22/23 Arrival Time: 1050  ASSESSMENT & PLAN:  1. Persistent cough   2. Mild intermittent asthma with acute exacerbation    Without resp distress.  I have personally viewed and independently interpreted the imaging studies ordered this visit. CXR: no acute changes.  Given duration of symptoms, cough will treat with: Meds ordered this encounter  Medications   azithromycin (ZITHROMAX) 250 MG tablet    Sig: Take 1 tablet (250 mg total) by mouth daily. Take first 2 tablets together, then 1 every day until finished.    Dispense:  6 tablet    Refill:  0   predniSONE (DELTASONE) 20 MG tablet    Sig: Take 2 tablets (40 mg total) by mouth daily.    Dispense:  10 tablet    Refill:  0   HYDROcodone bit-homatropine (HYCODAN) 5-1.5 MG/5ML syrup    Sig: Take 5 mLs by mouth every 6 (six) hours as needed for cough.    Dispense:  60 mL    Refill:  0   Asthma precautions given. OTC symptom care as needed.  Recommend:  Follow-up Information     Schedule an appointment as soon as possible for a visit  with Barnie Mort, PA-C.   Specialty: Physician Assistant Why: For follow up. Contact information: [address] [place] [address] [place] [address] [phone]         [facility] [place].   Specialty: Urgent Care Why: If worsening or failing to improve as anticipated. Contact information: [address] [place] [address] [phone]                Reviewed expectations re: course of current medical issues. Questions answered. Outlined signs and symptoms indicating need for more acute intervention. Patient verbalized understanding. After Visit Summary given.  SUBJECTIVE: History from: patient.  Karen Ferguson is a 67 y.o. female who presents with complaint of coughing; at least two weeks. Now wheezing. Denies SOB/CP. Denies fever. Inhaler without much relief.  Social History    Tobacco Use  Smoking Status Never  Smokeless Tobacco Never     OBJECTIVE:  Vitals:   04/22/23 1200  BP: (!) 144/87  Pulse: 63  Resp: 20  Temp: 98.6 F (37 C)  TempSrc: Oral  SpO2: 95%     General appearance: alert; NAD HEENT: [place]; AT; without nasal congestion Neck: supple without LAD Cv: RRR without murmer Lungs: unlabored respirations, moderate bilateral expiratory wheezing; cough: dry and active during visit/exam; no significant respiratory distress Skin: warm and dry Psychological: alert and cooperative; normal mood and affect  Imaging: DG Chest 2 View  Result Date: 04/22/2023 CLINICAL DATA:  Cough for 2 weeks. EXAM: CHEST - 2 VIEW COMPARISON:  July 31, 2022. FINDINGS: Stable cardiomediastinal silhouette. Both lungs are clear. The visualized skeletal structures are unremarkable. IMPRESSION: No active cardiopulmonary disease. Electronically Signed   By: Lupita Raider M.D.   On: 04/22/2023 14:14    Allergies  Allergen Reactions   Shellfish Allergy Hives    Past Medical History:  Diagnosis Date   Arthritis    Asthma    Albuterol prn   Bronchitis    Degenerative disc disease    Depression    Diabetes mellitus without complication (HCC)    Borderline diabetic in the past per pt   History of echocardiogram    Echo 1/18: EF 60-65, no RWMA, Gr 1 DD, PASP 20   Hypertension  PVC's (premature ventricular contractions)    Holter 12/17: NSR, occ PAC/PVCs, no AFib; one 4 beat run NSVT   Family History  Problem Relation Age of Onset   Thyroid disease Mother    Arthritis Mother    Hypertension Mother    Diabetes Mother    Kidney disease Mother    Arrhythmia Mother        AFib   Thyroid disease Sister    Arthritis Sister    Heart disease Sister    Hypertension Brother    Heart disease Maternal Grandfather    Heart attack Maternal Grandfather 45       MI   Heart disease Maternal Aunt    Heart failure Maternal Aunt    Heart disease Maternal Uncle     Cancer Maternal Uncle    Social History   Socioeconomic History   Marital status: Single    Spouse name: Not on file   Number of children: 1   Years of education: Not on file   Highest education level: Not on file  Occupational History   Not on file  Tobacco Use   Smoking status: Never   Smokeless tobacco: Never  Vaping Use   Vaping status: Never Used  Substance and Sexual Activity   Alcohol use: Yes    Comment: occasional   Drug use: No   Sexual activity: Not Currently  Other Topics Concern   Not on file  Social History Narrative   Works at [employer] in [employer].     Single   1 daughter - passed away at age 35   Lives with her 2 grandchildren.   Right Handed   Social Determinants of Health   Financial Resource Strain: Medium Risk (06/10/2022)   Received from [facility], [facility]   Overall Financial Resource Strain (CARDIA)    Difficulty of Paying Living Expenses: Somewhat hard  Food Insecurity: No Food Insecurity (06/10/2022)   Received from [facility], [facility]   Hunger Vital Sign    Worried About Running Out of Food in the Last Year: Never true    Ran Out of Food in the Last Year: Never true  Transportation Needs: No Transportation Needs (06/10/2022)   Received from [facility], [facility]   PRAPARE - Transportation    Lack of Transportation (Medical): No    Lack of Transportation (Non-Medical): No  Physical Activity: Inactive (08/23/2021)   Received from [facility], [facility]   Exercise Vital Sign    Days of Exercise per Week: 0 days    Minutes of Exercise per Session: 0 min  Stress: No Stress Concern Present (08/23/2021)   Received from [facility], [facility] - Occupational Stress Questionnaire    Feeling of Stress : Only a little  Social Connections: Unknown (08/31/2022)   Received from [facility], [facility]   Social Network    Social Network: Not on file   Intimate Partner Violence: Unknown (08/31/2022)   Received from [facility], [facility]   HITS    Physically Hurt: Not on file    Insult or Talk Down To: Not on file    Threaten Physical Harm: Not on file    Scream or Curse: Not on file             Mardella Layman, MD 04/22/23 1455

## 2023-06-16 ENCOUNTER — Ambulatory Visit (INDEPENDENT_AMBULATORY_CARE_PROVIDER_SITE_OTHER): Payer: 59 | Admitting: Internal Medicine

## 2023-06-16 VITALS — BP 124/68 | HR 65 | Resp 20 | Ht 64.0 in | Wt 205.2 lb

## 2023-06-16 DIAGNOSIS — E042 Nontoxic multinodular goiter: Secondary | ICD-10-CM | POA: Diagnosis not present

## 2023-06-16 DIAGNOSIS — E0590 Thyrotoxicosis, unspecified without thyrotoxic crisis or storm: Secondary | ICD-10-CM

## 2023-06-16 NOTE — Progress Notes (Unsigned)
Name: Karen Ferguson  MRN/ DOB: [id number], August 22, 1955    Age/ Sex: 68 y.o., female     PCP: Modesta Messing   Reason for Endocrinology Evaluation: Hyperthyroidism     Initial Endocrinology Clinic Visit: 06/05/2021    PATIENT IDENTIFIER: Karen Ferguson is a 68 y.o., female with a past medical history of HTN, A.Fib, MNG. She has followed with [facility] since 06/05/2021 for consultative assistance with management of her hyperthyroidism.   HISTORICAL SUMMARY: The patient was first diagnosed with hyperthyroidism in 05/2021 with a suppressed TSH <0.01 uIU/mL and elevated FT4 at 2.91 ng/dL   She was started on Methimazole 05/2021  Thyroid ultrasound 05/2021 revealed MNG. She is S/P benign FNA of the isthmic 2.5 cm nodule ( Bethesda category II) on 08/26/2021   Sister with thyroid disease    She was seen by Dr. Everardo All from January 2023 until April 2023    SUBJECTIVE:    Today (06/16/2023):  Karen Ferguson is here for a follow up on hyperthyroidism and MNG.   Weight stable  Denies local neck swelling  Has occasional tremors Denies palpitation Continue  chronic constipation Ha mild anxiety/jittery sensation    Methimazole 5 mg , 1 tab Monday through Saturday, none on Sundays  HISTORY:  Past Medical History:  Past Medical History:  Diagnosis Date   Arthritis    Asthma    Albuterol prn   Bronchitis    Degenerative disc disease    Depression    Diabetes mellitus without complication (HCC)    Borderline diabetic in the past per pt   History of echocardiogram    Echo 1/18: EF 60-65, no RWMA, Gr 1 DD, PASP 20   Hypertension    PVC's (premature ventricular contractions)    Holter 12/17: NSR, occ PAC/PVCs, no AFib; one 4 beat run NSVT   Past Surgical History:  Past Surgical History:  Procedure Laterality Date   ABDOMINAL HYSTERECTOMY  1988   for fibroid tumors   CARPAL TUNNEL RELEASE  1985   rt   CHOLECYSTECTOMY  1989   HEMORRHOID SURGERY  1986    KNEE ARTHROSCOPY Right    TENDON REPAIR Right 03/03/2013   Procedure: RIGHT DEBRIDEMENT AND TENOLYSIS OF PERONEOUS LONGOUS AND BREVIS TENDONS ;  Surgeon: Toni Arthurs, MD;  Location: [facility];  Service: Orthopedics;  Laterality: Right;   TOOTH EXTRACTION N/A 03/25/2019   Procedure: DENTAL EXTRACTIONS TEETH NUMBER THREE, FOUR, FIVE, SIX, SEVEN, NINE, TWELVE, FOURTEEN, THIRTY AND ALVEOLOPLASTY;  Surgeon: Ocie Doyne, DDS;  Location: [facility];  Service: Oral Surgery;  Laterality: N/A;   Social History:  reports that she has never smoked. She has never used smokeless tobacco. She reports current alcohol use. She reports that she does not use drugs. Family History:  Family History  Problem Relation Age of Onset   Thyroid disease Mother    Arthritis Mother    Hypertension Mother    Diabetes Mother    Kidney disease Mother    Arrhythmia Mother        AFib   Thyroid disease Sister    Arthritis Sister    Heart disease Sister    Hypertension Brother    Heart disease Maternal Grandfather    Heart attack Maternal Grandfather 44       MI   Heart disease Maternal Aunt    Heart failure Maternal Aunt    Heart disease Maternal Uncle    Cancer Maternal Uncle  HOME MEDICATIONS: Allergies as of 06/16/2023       Reactions   Shellfish Allergy Hives        Medication List        Accurate as of June 16, 2023  3:05 PM. If you have any questions, ask your nurse or doctor.          acetaminophen 500 MG tablet Commonly known as: TYLENOL Take 500 mg by mouth every 6 (six) hours as needed for mild pain.   albuterol 108 (90 Base) MCG/ACT inhaler Commonly known as: VENTOLIN HFA INHALE 2 PUFFS INTO THE LUNGS FOUR TIMES DAILY AS NEEDED FOR SHORTNESS OF BREATH.   azithromycin 250 MG tablet Commonly known as: ZITHROMAX Take 1 tablet (250 mg total) by mouth daily. Take first 2 tablets together, then 1 every day until finished.   benzonatate 200 MG capsule Commonly known  as: TESSALON Take 1 capsule (200 mg total) by mouth 3 (three) times daily as needed for cough.   Eliquis 5 MG Tabs tablet Generic drug: apixaban Take 1 tablet (5 mg total) by mouth 2 (two) times daily.   HYDROcodone bit-homatropine 5-1.5 MG/5ML syrup Commonly known as: HYCODAN Take 5 mLs by mouth every 6 (six) hours as needed for cough.   methimazole 5 MG tablet Commonly known as: TAPAZOLE Take 1 tablet (5 mg total) by mouth daily.   metoprolol tartrate 25 MG tablet Commonly known as: LOPRESSOR Take 1 tablet (25 mg total) by mouth 2 (two) times daily.   omeprazole 40 MG capsule Commonly known as: PRILOSEC Take 40 mg by mouth daily.   pantoprazole 40 MG tablet Commonly known as: PROTONIX Take 1 tablet (40 mg total) by mouth daily.   predniSONE 20 MG tablet Commonly known as: DELTASONE Take 2 tablets (40 mg total) by mouth daily.          OBJECTIVE:   PHYSICAL EXAM: VS: BP 124/68 (BP Location: Left Arm, Patient Position: Sitting, Cuff Size: Normal)   Pulse 65   Resp 20   Ht 5\' 4"  (1.626 m)   Wt 205 lb 3.2 oz (93.1 kg)   SpO2 92%   BMI 35.22 kg/m    EXAM: General: Pt appears well and is in NAD  Neck: General: Supple without adenopathy. Thyroid: Thyroid  nodules appreciated  Lungs: Clear with good BS bilat   Heart: Auscultation: RRR.  Extremities:  BL LE: trace pretibial edema   Mental Status: Judgment, insight: Intact Orientation: Oriented to time, place, and person Mood and affect: No depression, anxiety, or agitation     DATA REVIEWED:  *****  Thyroid Ultrasound 06/06/2022  Estimated total number of nodules >/= 1 cm: 1   Number of spongiform nodules >/=  2 cm not described below (TR1): 0   Number of mixed cystic and solid nodules >/= 1.5 cm not described below (TR2): 0   _________________________________________________________   Nodule # 1: The previously biopsied isthmic nodule has changed in configuration since the time of biopsy. The  cystic component has largely resolved and the nodule appears more solid and difficult to discern from the background parenchyma. The nodule measures grossly 2.7 x 2.0 x 2.3 cm which is insignificantly changed compared to prior.   The isthmus and left aspect of the thyroid gland are diffusely replaced by lobular goitrous tissue. No definite discrete nodule is identified on the left. The normal thyroid parenchyma on the right is relatively hypoechoic and diffusely heterogeneous.   IMPRESSION: 1. Markedly enlarged and heterogeneous thyroid gland  with diffuse nodular goitrous replacement of the entire isthmus and left gland. 2. The previously biopsied nodule in the thyroid isthmus has changed in character. The cystic component has resolved in the nodule is much less well-defined on today's study blending in with the background parenchyma. Recommend correlation with prior biopsy results. 3. Diffuse lobular goitrous enlargement of the entire left gland yielding a pseudo nodular appearance.     FNA isthmic nodule 08/26/2021  Clinical History: Isthmus; Superior 2.5cm' Other 2 dimensions: 1.5 x  1.9cm, Solid / almost completely solid, Isoechoic, TI-RADS total points  6  Specimen Submitted:  A. THYROID, SUPERIOR ISTHMUS, FINE NEEDLE  ASPIRATION:    FINAL MICROSCOPIC DIAGNOSIS:  - Consistent with benign follicular nodule (Bethesda category II)     ASSESSMENT / PLAN / RECOMMENDATIONS:   Hyperthyroidism  -Patient is clinically euthyroid -Tolerating methimazole without side effects - TFT's today show improvement in TSH, but remains elevated.  Free T4 is normal. -The patient continues to use half a tablet of 10 mg tablets of methimazole, will decrease the dose as below   Medications   Decrease methimazole 5 mg daily Monday through Saturday, none on Sundays   2.  Multinodular goiter:  -No local neck symptoms -She is s/p benign FNA of the isthmic 2.5 cm nodule 08/2021 -Thyroid  ultrasound 05/2022 showed resolution of the cystic component of the nodule, will proceed with repeat thyroid ultrasound  Follow-up in 6 months    Signed electronically by: Lyndle Herrlich, MD  [facility]  [facility] [address] [place] [place], [place] [address] Phone: [phone] FAX: [phone]      CC: Modesta Messing [address] [place] [address] Phone: [phone]  Fax: [phone]   Return to Endocrinology clinic as below: No future appointments.

## 2023-06-17 ENCOUNTER — Encounter: Payer: Self-pay | Admitting: Internal Medicine

## 2023-06-17 LAB — TSH: TSH: 5.83 m[IU]/L — ABNORMAL HIGH (ref 0.40–4.50)

## 2023-06-17 LAB — T4, FREE: Free T4: 0.9 ng/dL (ref 0.8–1.8)

## 2023-06-17 LAB — T3, FREE: T3, Free: 2.8 pg/mL (ref 2.3–4.2)

## 2023-06-17 MED ORDER — METHIMAZOLE 5 MG PO TABS: 5.0000 mg | ORAL_TABLET | ORAL | 3 refills | Status: DC

## 2023-07-03 ENCOUNTER — Ambulatory Visit
Admission: RE | Admit: 2023-07-03 | Discharge: 2023-07-03 | Disposition: A | Discharge status: Home / Self Care | Payer: 59 | Source: Ambulatory Visit | Attending: Internal Medicine | Admitting: Internal Medicine

## 2023-07-03 DIAGNOSIS — E042 Nontoxic multinodular goiter: Secondary | ICD-10-CM

## 2023-07-06 ENCOUNTER — Encounter: Payer: Self-pay | Admitting: Internal Medicine

## 2023-07-17 ENCOUNTER — Other Ambulatory Visit: Payer: Self-pay | Admitting: Nurse Practitioner

## 2023-07-17 ENCOUNTER — Other Ambulatory Visit: Payer: Self-pay | Admitting: Cardiology

## 2023-08-04 ENCOUNTER — Ambulatory Visit (HOSPITAL_COMMUNITY)
Admission: EM | Admit: 2023-08-04 | Discharge: 2023-08-04 | Disposition: A | Discharge status: Home / Self Care | Attending: Family Medicine | Admitting: Family Medicine

## 2023-08-04 ENCOUNTER — Encounter (HOSPITAL_COMMUNITY): Payer: Self-pay

## 2023-08-04 DIAGNOSIS — R300 Dysuria: Secondary | ICD-10-CM | POA: Diagnosis present

## 2023-08-04 LAB — POCT URINALYSIS DIP (MANUAL ENTRY)
Bilirubin, UA: NEGATIVE
Glucose, UA: NEGATIVE mg/dL
Leukocytes, UA: NEGATIVE
Nitrite, UA: NEGATIVE
Protein Ur, POC: 100 mg/dL — AB
Spec Grav, UA: >=1.03 — AB (ref 1.010–1.025)
Urobilinogen, UA: 0.2 U/dL
pH, UA: 5.5 (ref 5.0–8.0)

## 2023-08-04 MED ORDER — PHENAZOPYRIDINE HCL 200 MG PO TABS: 200.0000 mg | ORAL_TABLET | Freq: Three times a day (TID) | ORAL | 0 refills | Status: DC

## 2023-08-04 NOTE — ED Provider Notes (Signed)
 [facility]  Note:  This document was prepared using Dragon voice recognition software and may include unintentional dictation errors.  MRN: [id number] DOB: 1955-11-14  Subjective:   Karen Ferguson is a 68 y.o. female presenting for dysuria x 2 days with history of urinary tract infections.  Patient denies any increased urinary frequency, abdominal pain, flank pain, hematuria.  Patient denies taking any over-the-counter or previously prescribed medications to treat symptoms.  No shortness of breath, chest pain, weakness, dizziness.  No current facility-administered medications for this encounter.  Current Outpatient Medications:    phenazopyridine (PYRIDIUM) 200 MG tablet, Take 1 tablet (200 mg total) by mouth 3 (three) times daily., Disp: 6 tablet, Rfl: 0   acetaminophen (TYLENOL) 500 MG tablet, Take 500 mg by mouth every 6 (six) hours as needed for mild pain., Disp: , Rfl:    albuterol (VENTOLIN HFA) 108 (90 Base) MCG/ACT inhaler, INHALE 2 PUFFS INTO THE LUNGS FOUR TIMES DAILY AS NEEDED FOR SHORTNESS OF BREATH., Disp: 8.5 g, Rfl: 3   apixaban (ELIQUIS) 5 MG TABS tablet, Take 1 tablet (5 mg total) by mouth 2 (two) times daily., Disp: 60 tablet, Rfl: 2   methimazole (TAPAZOLE) 5 MG tablet, Take 1 tablet (5 mg total) by mouth as directed. 1 tablet Monday through Friday and none on Saturdays and Sundays, Disp: 65 tablet, Rfl: 3   metoprolol tartrate (LOPRESSOR) 25 MG tablet, TAKE 1 TABLET(25 MG) BY MOUTH TWICE DAILY, Disp: 60 tablet, Rfl: 0   omeprazole (PRILOSEC) 40 MG capsule, Take 40 mg by mouth daily., Disp: , Rfl:    pantoprazole (PROTONIX) 40 MG tablet, Take 1 tablet (40 mg total) by mouth daily., Disp: 30 tablet, Rfl: 0   Allergies  Allergen Reactions   Shellfish Allergy Hives    Past Medical History:  Diagnosis Date   Arthritis    Asthma    Albuterol prn   Bronchitis    Degenerative disc disease    Depression    Diabetes mellitus without complication (HCC)     Borderline diabetic in the past per pt   History of echocardiogram    Echo 1/18: EF 60-65, no RWMA, Gr 1 DD, PASP 20   Hypertension    PVC's (premature ventricular contractions)    Holter 12/17: NSR, occ PAC/PVCs, no AFib; one 4 beat run NSVT     Past Surgical History:  Procedure Laterality Date   ABDOMINAL HYSTERECTOMY  1988   for fibroid tumors   CARPAL TUNNEL RELEASE  1985   rt   CHOLECYSTECTOMY  1989   HEMORRHOID SURGERY  1986   KNEE ARTHROSCOPY Right    TENDON REPAIR Right 03/03/2013   Procedure: RIGHT DEBRIDEMENT AND TENOLYSIS OF PERONEOUS LONGOUS AND BREVIS TENDONS ;  Surgeon: Toni Arthurs, MD;  Location: [facility];  Service: Orthopedics;  Laterality: Right;   TOOTH EXTRACTION N/A 03/25/2019   Procedure: DENTAL EXTRACTIONS TEETH NUMBER THREE, FOUR, FIVE, SIX, SEVEN, NINE, TWELVE, FOURTEEN, THIRTY AND ALVEOLOPLASTY;  Surgeon: Ocie Doyne, DDS;  Location: [facility];  Service: Oral Surgery;  Laterality: N/A;    Family History  Problem Relation Age of Onset   Thyroid disease Mother    Arthritis Mother    Hypertension Mother    Diabetes Mother    Kidney disease Mother    Arrhythmia Mother        AFib   Thyroid disease Sister    Arthritis Sister    Heart disease Sister    Hypertension Brother  Heart disease Maternal Grandfather    Heart attack Maternal Grandfather 45       MI   Heart disease Maternal Aunt    Heart failure Maternal Aunt    Heart disease Maternal Uncle    Cancer Maternal Uncle     Social History   Tobacco Use   Smoking status: Never   Smokeless tobacco: Never  Vaping Use   Vaping status: Never Used  Substance Use Topics   Alcohol use: Yes    Comment: occasional   Drug use: No    ROS Refer to HPI for ROS details.  Objective:   Vitals: BP 135/73 (BP Location: Left Arm)   Pulse 66   Temp 99.1 F (37.3 C) (Oral)   Resp 18   SpO2 94%   Physical Exam Vitals and nursing note reviewed.  Constitutional:      General: She  is not in acute distress.    Appearance: Normal appearance. She is well-developed. She is not ill-appearing or toxic-appearing.  HENT:     Head: Normocephalic.  Cardiovascular:     Rate and Rhythm: Normal rate.  Pulmonary:     Effort: Pulmonary effort is normal. No respiratory distress.  Abdominal:     Palpations: Abdomen is soft.     Tenderness: There is no abdominal tenderness. There is no right CVA tenderness, left CVA tenderness, guarding or rebound.  Musculoskeletal:     Cervical back: Neck supple.  Skin:    General: Skin is warm and dry.  Neurological:     General: No focal deficit present.     Mental Status: She is alert and oriented to person, place, and time.  Psychiatric:        Mood and Affect: Mood normal.     Procedures  Results for orders placed or performed during the hospital encounter of 08/04/23 (from the past 24 hours)  POC urinalysis dipstick     Status: Abnormal   Collection Time: 08/04/23  3:49 PM  Result Value Ref Range   Color, UA yellow yellow   Clarity, UA clear clear   Glucose, UA negative negative mg/dL   Bilirubin, UA negative negative   Ketones, POC UA trace (5) (A) negative mg/dL   Spec Grav, UA >=6.213 (A) 1.010 - 1.025   Blood, UA moderate (A) negative   pH, UA 5.5 5.0 - 8.0   Protein Ur, POC =100 (A) negative mg/dL   Urobilinogen, UA 0.2 0.2 or 1.0 E.U./dL   Nitrite, UA Negative Negative   Leukocytes, UA Negative Negative    Assessment and Plan :   PDMP not reviewed this encounter.  1. Dysuria    Dysuria without UTI -Urinalysis performed in UC shows moderate blood, no leukocytes, no nitrite, no significant sign of urinary tract infection -Urine culture sent to lab for further testing results should be available in 24 to 48 hours. -Use prescribed Pyridium 1 tablet 3 times daily as needed for dysuria symptoms -Drink plenty of fluids, go to the restroom when you feel the urge to urinate, and practice proper hygiene to prevent further  infection. -Continue to monitor symptoms for any change in severity if there is any escalation of symptoms follow-up for further evaluation and management.  [name]   [name], [place] [address], [place] 08/04/23 1624

## 2023-08-04 NOTE — Discharge Instructions (Addendum)
 Dysuria without UTI -Urinalysis performed in UC shows moderate blood, no leukocytes, no nitrite, no significant sign of urinary tract infection -Urine culture sent to lab for further testing results should be available in 24 to 48 hours. -Use prescribed Pyridium 1 tablet 3 times daily as needed for dysuria symptoms -Drink plenty of fluids, go to the restroom when you feel the urge to urinate, and practice proper hygiene to prevent further infection. -Continue to monitor symptoms for any change in severity if there is any escalation of symptoms follow-up for further evaluation and management.

## 2023-08-04 NOTE — ED Triage Notes (Signed)
 Pt c/o burning and pain on urination x2 days. States hx of UTI's.

## 2023-08-05 ENCOUNTER — Encounter (HOSPITAL_COMMUNITY): Payer: Self-pay

## 2023-08-05 LAB — URINE CULTURE
Culture: NO GROWTH
Special Requests: NORMAL

## 2023-08-25 ENCOUNTER — Other Ambulatory Visit: Payer: Self-pay | Admitting: Cardiology

## 2023-08-25 IMAGING — US US FNA BIOPSY THYROID 1ST LESION
1 series · 8 of 8 positions shown · non-contrast
Comparison: 05/30/2021

MEDICATIONS:
1% lidocaine local

COMPLICATIONS:
None immediate.

INDICATION: Indeterminate thyroid nodule

EXAM:
ULTRASOUND GUIDED FINE NEEDLE ASPIRATION OF INDETERMINATE THYROID
NODULE
TECHNIQUE: Informed written consent was obtained from the patient after a
discussion of the risks, benefits and alternatives to treatment.
Questions regarding the procedure were encouraged and answered. A
timeout was performed prior to the initiation of the procedure.

[Series 1: us fna biopsy thyroid 1st lesion · 0.05mm/px · 8 acquisitions, 8 frames shown]
[im 1/8]
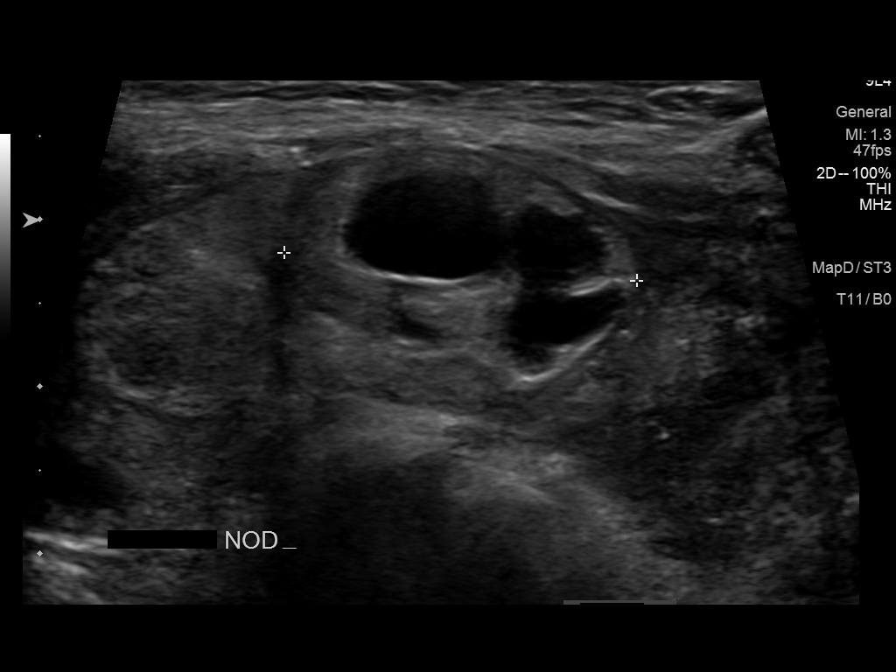
[im 2/8]
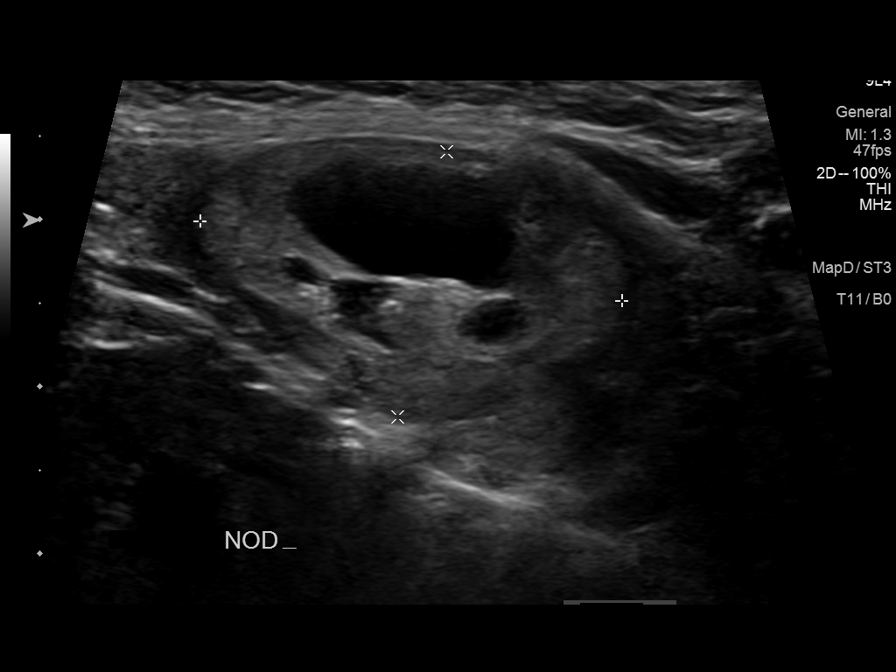
[im 3/8]
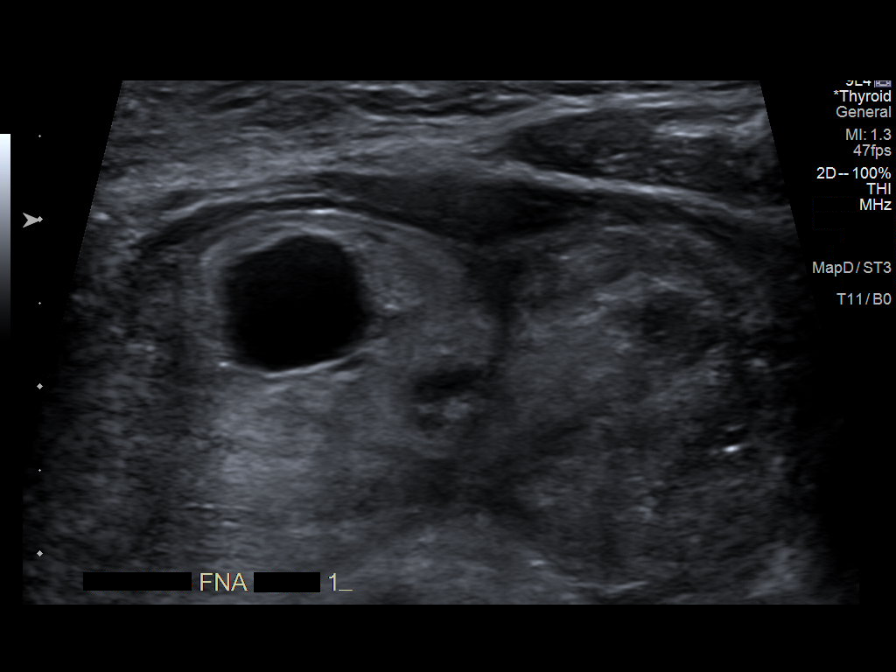
[im 4/8]
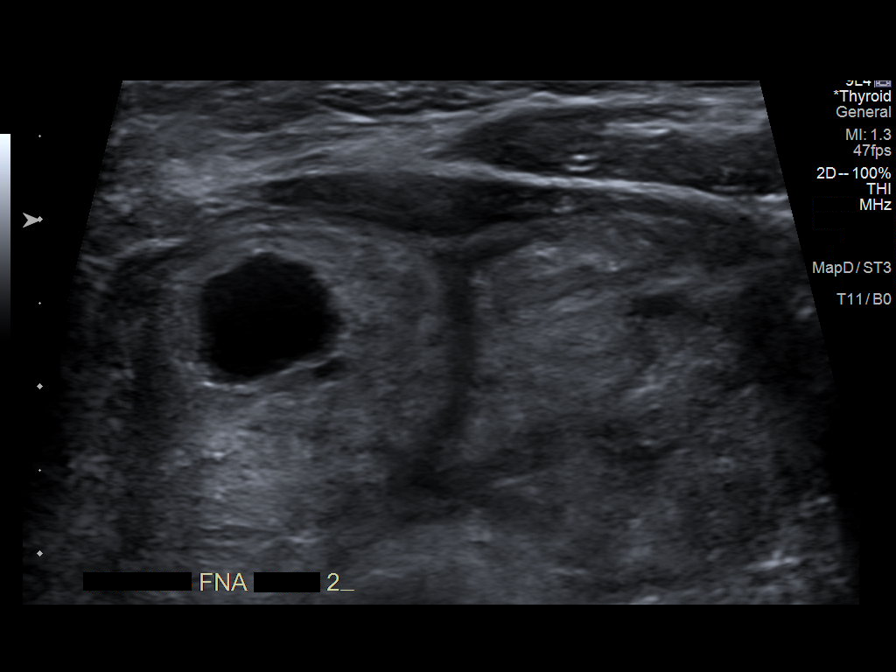
[im 5/8]
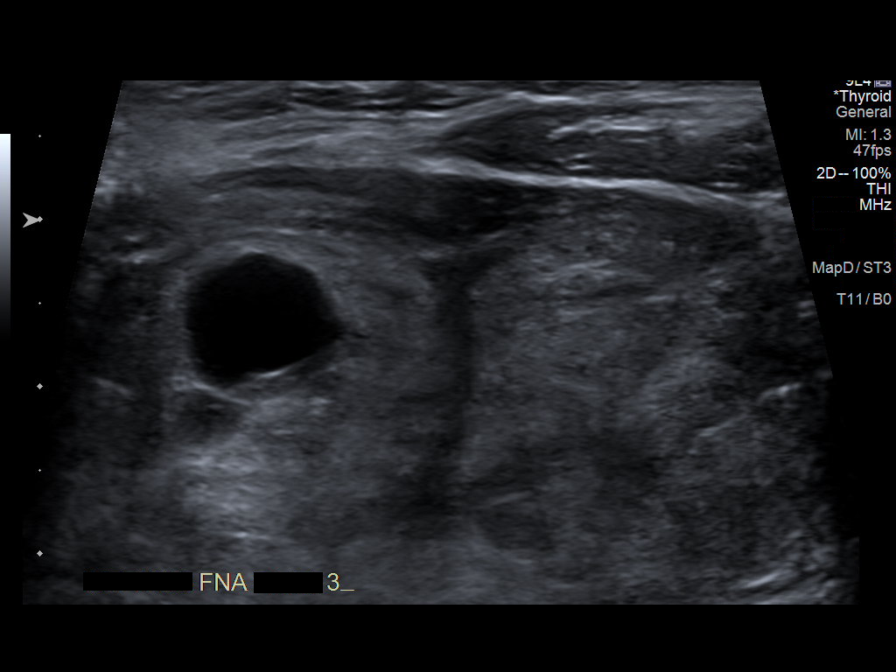
[im 6/8]
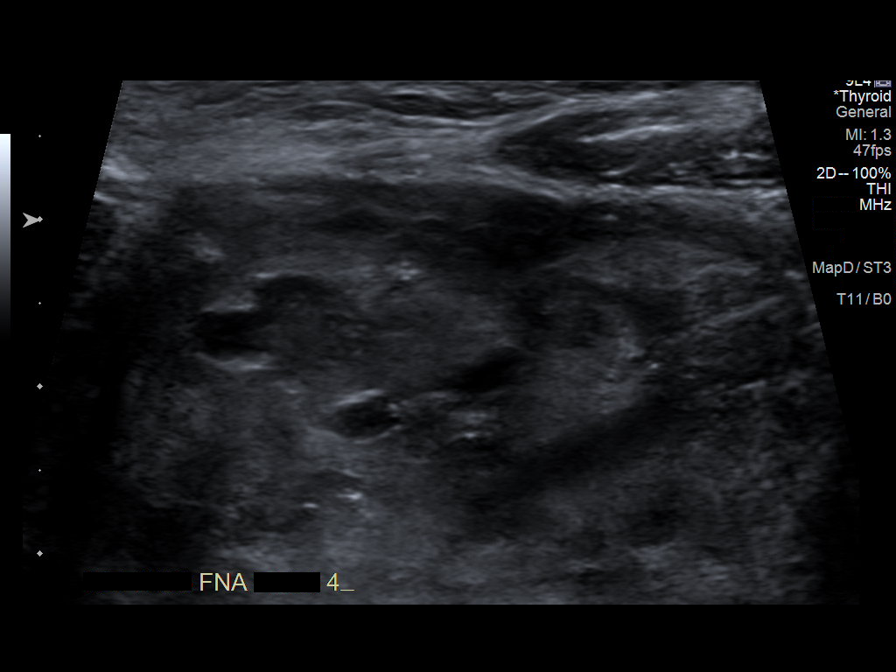
[im 7/8]
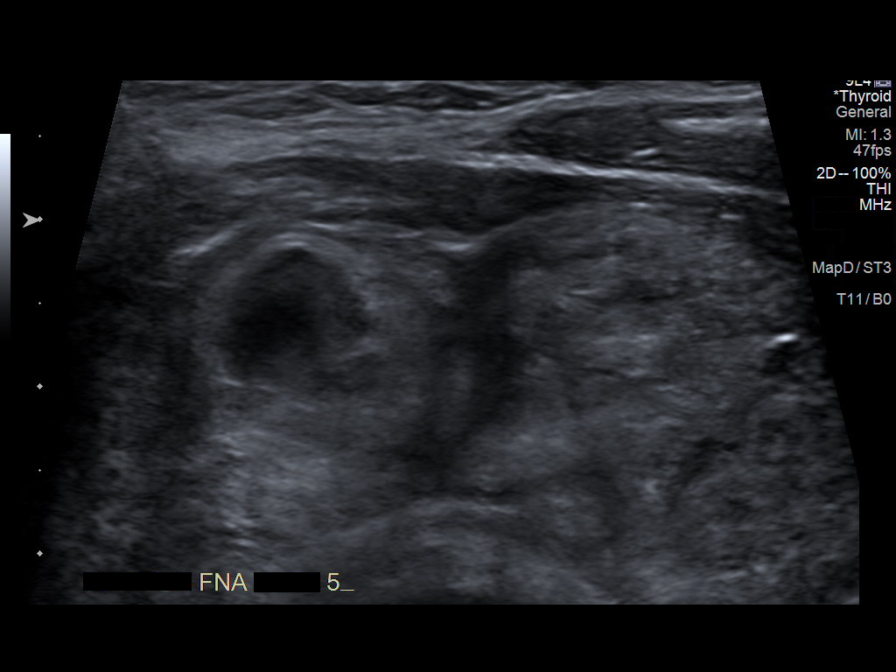
[im 8/8]
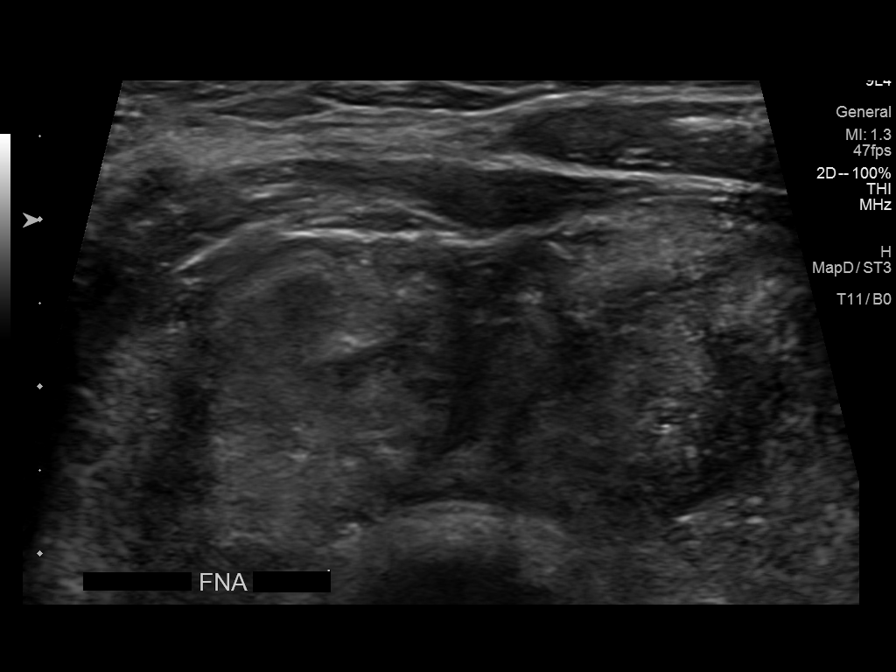

[8 of 8 positions shown; findings below may reference images not displayed]

Pre-procedural ultrasound scanning demonstrated unchanged size and
appearance of the indeterminate nodule within the isthmus

The procedure was planned. The neck was prepped in the usual sterile
fashion, and a sterile drape was applied covering the operative
field. A timeout was performed prior to the initiation of the
procedure. Local anesthesia was provided with 1% lidocaine.

Under direct ultrasound guidance, 5 FNA biopsies were performed of
the 2.5 cm isthmus TR 4 nodule with a 25 gauge needle. Multiple
ultrasound images were saved for procedural documentation purposes.
The samples were prepared and submitted to pathology.

Limited post procedural scanning was negative for hematoma or
additional complication. Dressings were placed. The patient
tolerated the above procedures procedure well without immediate
postprocedural complication.
FINDINGS: Nodule reference number based on prior diagnostic ultrasound: 1

Maximum size: 2.5 cm

Location: Isthmus; Superior

ACR TI-RADS risk category: TR4 (4-6 points)

Reason for biopsy: meets ACR TI-RADS criteria

Ultrasound imaging confirms appropriate placement of the needles
within the thyroid nodule.
IMPRESSION: Technically successful ultrasound guided fine needle aspiration of
the 2.5 cm isthmus TR 4 nodule

## 2023-09-10 ENCOUNTER — Other Ambulatory Visit: Payer: Self-pay | Admitting: Cardiology

## 2023-09-11 ENCOUNTER — Other Ambulatory Visit: Payer: Self-pay | Admitting: Cardiology

## 2023-09-12 ENCOUNTER — Telehealth: Payer: Self-pay | Admitting: Cardiology

## 2023-09-12 NOTE — Telephone Encounter (Signed)
 Outpatient service line: Blood pressure  Patient called reporting blood pressure in the 150s.  Really no other complaints no shortness of breath or chest pain.  She has not been seen in about a year.  She needs to work on scheduling follow-up appointment but cannot be seen till after 3:00 so advised her to call Monday morning to arrange follow-up once she knew her work schedule.

## 2023-09-14 ENCOUNTER — Telehealth: Payer: Self-pay | Admitting: Cardiology

## 2023-09-14 MED ORDER — METOPROLOL TARTRATE 25 MG PO TABS: 25.0000 mg | ORAL_TABLET | Freq: Two times a day (BID) | ORAL | 0 refills | Status: DC

## 2023-09-14 NOTE — Telephone Encounter (Signed)
 RX sent to requested Pharmacy

## 2023-09-14 NOTE — Telephone Encounter (Signed)
*  STAT* If patient is at the pharmacy, call can be transferred to refill team.   1. Which medications need to be refilled? (please list name of each medication and dose if known)   metoprolol  tartrate (LOPRESSOR ) 25 MG tablet    2. Which pharmacy/location (including street and city if local pharmacy) is medication to be sent to?  [id number] - [place], [place] - [address] AT [facility] OF [address]    3. Do they need a 30 day or 90 day supply? 90  Patient has appt on 5/2

## 2023-09-21 NOTE — Progress Notes (Signed)
 Cardiology Office Note:    Date:  09/25/2023   ID:  Karen Ferguson, Karen Ferguson 1955/12/30, MRN [id number]  PCP:  Sharalyn Dasen   [facility] Providers Cardiologist:  Eilleen Grates, MD Cardiology APP:  Lamond Pilot, PA     Referring MD: Elwyn Hamper, PA-C   Chief Complaint  Patient presents with   Follow-up    HTN    History of Present Illness:    Karen Ferguson is a 68 y.o. female with a hx of PAF on eliquis , OSA, HFpEF, thyroid  disease, hypertension, obesity, and mild carotid artery stenosis.  Echocardiogram 2018 with preserved LVEF 60-65%, grade 1 DD, no significant valvular disease.  She was hospitalized 05/2021 found to be in atrial fibrillation with RVR in the setting of not taking Synthroid medication with a low TSH.  PAF felt likely related to her thyroid  abnormality.  She converted to sinus rhythm and has maintained SR at follow-up.  She remains on Eliquis  5 mg twice daily.  She returns today for routine cardiology follow-up.  She did call our after-hours line with concerns for hypotension with systolic BP in the 150 range.  BP well controlled. She states she feels constantly "woozy" and like she "is walking around high." Not consistent with orthostatic hypotension. I suspect she is chronically dehydrated and has not consistently taken iron. She also inquires about the inspire device as she can't tolerate the CPAP.    Past Medical History:  Diagnosis Date   Arthritis    Asthma    Albuterol  prn   Bronchitis    Degenerative disc disease    Depression    Diabetes mellitus without complication (HCC)    Borderline diabetic in the past per pt   History of echocardiogram    Echo 1/18: EF 60-65, no RWMA, Gr 1 DD, PASP 20   Hypertension    PVC's (premature ventricular contractions)    Holter 12/17: NSR, occ PAC/PVCs, no AFib; one 4 beat run NSVT    Past Surgical History:  Procedure Laterality Date   ABDOMINAL HYSTERECTOMY  1988   for fibroid tumors    CARPAL TUNNEL RELEASE  1985   rt   CHOLECYSTECTOMY  1989   HEMORRHOID SURGERY  1986   KNEE ARTHROSCOPY Right    TENDON REPAIR Right 03/03/2013   Procedure: RIGHT DEBRIDEMENT AND TENOLYSIS OF PERONEOUS LONGOUS AND BREVIS TENDONS ;  Surgeon: Amada Backer, MD;  Location:  SURGERY CENTER;  Service: Orthopedics;  Laterality: Right;   TOOTH EXTRACTION N/A 03/25/2019   Procedure: DENTAL EXTRACTIONS TEETH NUMBER THREE, FOUR, FIVE, SIX, SEVEN, NINE, TWELVE, FOURTEEN, THIRTY AND ALVEOLOPLASTY;  Surgeon: Ascencion Lava, DDS;  Location: [facility] OR;  Service: Oral Surgery;  Laterality: N/A;    Current Medications: Current Meds  Medication Sig   acetaminophen  (TYLENOL ) 500 MG tablet Take 500 mg by mouth every 6 (six) hours as needed for mild pain.   albuterol  (VENTOLIN  HFA) 108 (90 Base) MCG/ACT inhaler INHALE 2 PUFFS INTO THE LUNGS FOUR TIMES DAILY AS NEEDED FOR SHORTNESS OF BREATH.   apixaban  (ELIQUIS ) 5 MG TABS tablet Take 1 tablet (5 mg total) by mouth 2 (two) times daily.   iron polysaccharides (NU-IRON) 150 MG capsule Take 1 capsule (150 mg total) by mouth daily.   methimazole  (TAPAZOLE ) 5 MG tablet Take 1 tablet (5 mg total) by mouth as directed. 1 tablet Monday through Friday and none on Saturdays and Sundays   metoprolol  succinate (TOPROL  XL) 25 MG 24  hr tablet Take 1 tablet (25 mg total) by mouth at bedtime.   omeprazole  (PRILOSEC) 40 MG capsule Take 40 mg by mouth daily.   pantoprazole  (PROTONIX ) 40 MG tablet Take 1 tablet (40 mg total) by mouth daily.   phenazopyridine  (PYRIDIUM ) 200 MG tablet Take 1 tablet (200 mg total) by mouth 3 (three) times daily.   [DISCONTINUED] metoprolol  tartrate (LOPRESSOR ) 25 MG tablet Take 1 tablet (25 mg total) by mouth 2 (two) times daily.     Allergies:   Shellfish allergy   Social History   Socioeconomic History   Marital status: Single    Spouse name: Not on file   Number of children: 1   Years of education: Not on file   Highest education  level: Not on file  Occupational History   Not on file  Tobacco Use   Smoking status: Never   Smokeless tobacco: Never  Vaping Use   Vaping status: Never Used  Substance and Sexual Activity   Alcohol use: Yes    Comment: occasional   Drug use: No   Sexual activity: Not Currently  Other Topics Concern   Not on file  Social History Narrative   Works at [employer] ([employer] in [employer].     Single   1 daughter - passed away at age 56   Lives with her 2 grandchildren.   Right Handed   Social Drivers of Health   Financial Resource Strain: Patient Declined (05/11/2023)   Received from [facility]   Overall Financial Resource Strain (CARDIA)    Difficulty of Paying Living Expenses: Patient declined  Food Insecurity: Patient Declined (05/11/2023)   Received from [facility]   Hunger Vital Sign    Worried About Running Out of Food in the Last Year: Patient declined    Ran Out of Food in the Last Year: Patient declined  Transportation Needs: Patient Declined (05/11/2023)   Received from [facility] - Transportation    Lack of Transportation (Medical): Patient declined    Lack of Transportation (Non-Medical): Patient declined  Physical Activity: Sufficiently Active (05/11/2023)   Received from [facility]   Exercise Vital Sign    Days of Exercise per Week: 5 days    Minutes of Exercise per Session: 60 min  Stress: Patient Declined (05/11/2023)   Received from [facility] - Occupational Stress Questionnaire    Feeling of Stress : Patient declined  Social Connections: Patient Declined (05/11/2023)   Received from [facility]   Social Network    How would you rate your social network (family, work, friends)?: Patient declined     Family History: The patient's family history includes Arrhythmia in her mother; Arthritis in her mother and sister; Cancer in her maternal uncle; Diabetes in her mother; Heart  attack (age of onset: 28) in her maternal grandfather; Heart disease in her maternal aunt, maternal grandfather, maternal uncle, and sister; Heart failure in her maternal aunt; Hypertension in her brother and mother; Kidney disease in her mother; Thyroid  disease in her mother and sister.  ROS:   Please see the history of present illness.     All other systems reviewed and are negative.  EKGs/Labs/Other Studies Reviewed:    The following studies were reviewed today:  EKG Interpretation Date/Time:  Friday Sep 25 2023 08:37:50 EDT Ventricular Rate:  59 PR Interval:  176 QRS Duration:  74 QT Interval:  424 QTC Calculation: 419 R Axis:  15  Text Interpretation: Sinus bradycardia When compared with ECG of 23-Jun-2021 13:17, PREVIOUS ECG IS PRESENT Confirmed by Marcie Sever ([id number]) on 09/25/2023 8:48:40 AM    Recent Labs: 12/19/2022: ALT 27; BUN 9; Creatinine, Ser 0.92; Hemoglobin 11.8; Platelets 258; Potassium 4.4; Sodium 136 06/16/2023: TSH 5.83  Recent Lipid Panel    Component Value Date/Time   CHOL 164 01/10/2015 1009   TRIG 49 05/27/2021 0300   HDL 47 01/10/2015 1009   CHOLHDL 3.5 01/10/2015 1009   VLDL 20 01/10/2015 1009   LDLCALC 97 01/10/2015 1009     Risk Assessment/Calculations:    CHA2DS2-VASc Score = 3   This indicates a 3.2% annual risk of stroke. The patient's score is based upon: CHF History: 0 HTN History: 1 Diabetes History: 0 Stroke History: 0 Vascular Disease History: 0 Age Score: 1 Gender Score: 1             Physical Exam:    VS:  BP (!) 110/58   Pulse (!) 59   Ht 5\' 4"  (1.626 m)   Wt 199 lb (90.3 kg)   SpO2 93%   BMI 34.16 kg/m     Wt Readings from Last 3 Encounters:  09/25/23 199 lb (90.3 kg)  06/16/23 205 lb 3.2 oz (93.1 kg)  12/02/22 202 lb 9.6 oz (91.9 kg)     GEN:  Well nourished, well developed in no acute distress HEENT: Normal NECK: No JVD; No carotid bruits LYMPHATICS: No lymphadenopathy CARDIAC: RRR, no murmurs, rubs,  gallops RESPIRATORY:  Clear to auscultation without rales, wheezing or rhonchi  ABDOMEN: Soft, non-tender, non-distended MUSCULOSKELETAL:  No edema; No deformity  SKIN: Warm and dry NEUROLOGIC:  Alert and oriented x 3 PSYCHIATRIC:  Normal affect   ASSESSMENT:    1. PAF (paroxysmal atrial fibrillation) (HCC)   2. Pre-syncope   3. Chronic anticoagulation   4. Anemia, unspecified type   5. OSA (obstructive sleep apnea)    PLAN:    In order of problems listed above:  PAF - In the setting of thyroid  disease and medication noncompliance -Last TSH mildly elevated, following with endocrinology -she feels "woozy" - I will stop lopressor  and try 25 mg toprol  at night to see if this helps - bradycardic on EKG today, could be contributing, se is also chronically dehydrated - advised to increase non-sugary fluids   Chronic anticoagulation -No bleeding issues, continue 5 mg Eliquis  twice daily   Anemia - she eats excessive ice, has had issues affording her OTC iron - Hb improved on last check last summer - will prescribe nu-iron with labs at next PCP or cards visit   Hypertension - Well-controlled on beta-blocker alone   OSA  - can't tolerate CPAP machine, too many wires and she has to urinate several times at night - she inquires about inspire device - I will refer to ENT   Follow up in 6 months (me or Dr. Lavonne Prairie)         Medication Adjustments/Labs and Tests Ordered: Current medicines are reviewed at length with the patient today.  Concerns regarding medicines are outlined above.  Orders Placed This Encounter  Procedures   Ambulatory referral to ENT   EKG 12-Lead   Meds ordered this encounter  Medications   metoprolol  succinate (TOPROL  XL) 25 MG 24 hr tablet    Sig: Take 1 tablet (25 mg total) by mouth at bedtime.    Dispense:  90 tablet    Refill:  3   iron polysaccharides (  NU-IRON) 150 MG capsule    Sig: Take 1 capsule (150 mg total) by mouth daily.     Dispense:  90 capsule    Refill:  3    Patient Instructions  Medication Instructions:  Stop Lopressor  Star Metoprolol  Succinate ER 25 mg once a day  Start and Iron supplement sent to the pharmacy *If you need a refill on your cardiac medications before your next appointment, please call your pharmacy*  Lab Work: No labs  Testing/Procedures: No testing  Follow-Up: At [facility], you and your health needs are our priority.  As part of our continuing mission to provide you with exceptional heart care, our providers are all part of one team.  This team includes your primary Cardiologist (physician) and Advanced Practice Providers or APPs (Physician Assistants and Nurse Practitioners) who all work together to provide you with the care you need, when you need it.  Your next appointment:   6 month(s)  Provider:   Eilleen Grates, MD or Marcie Sever, PA-C  We recommend signing up for the patient portal called "MyChart".  Sign up information is provided on this After Visit Summary.  MyChart is used to connect with patients for Virtual Visits (Telemedicine).  Patients are able to view lab/test results, encounter notes, upcoming appointments, etc.  Non-urgent messages can be sent to your provider as well.   To learn more about what you can do with MyChart, go to [email or web address].   Other Instructions: We are going to refer you over to the ENT specialty, They will reach out to you.   Signed, Lamond Pilot, [place]  09/25/2023 9:09 AM    [facility]

## 2023-09-25 ENCOUNTER — Encounter: Payer: Self-pay | Admitting: Physician Assistant

## 2023-09-25 ENCOUNTER — Ambulatory Visit: Attending: Physician Assistant | Admitting: Physician Assistant

## 2023-09-25 VITALS — BP 110/58 | HR 59 | Ht 64.0 in | Wt 199.0 lb

## 2023-09-25 DIAGNOSIS — D649 Anemia, unspecified: Secondary | ICD-10-CM

## 2023-09-25 DIAGNOSIS — R55 Syncope and collapse: Secondary | ICD-10-CM | POA: Diagnosis not present

## 2023-09-25 DIAGNOSIS — Z7901 Long term (current) use of anticoagulants: Secondary | ICD-10-CM

## 2023-09-25 DIAGNOSIS — I480 Paroxysmal atrial fibrillation: Secondary | ICD-10-CM

## 2023-09-25 DIAGNOSIS — I10 Essential (primary) hypertension: Secondary | ICD-10-CM

## 2023-09-25 DIAGNOSIS — G4733 Obstructive sleep apnea (adult) (pediatric): Secondary | ICD-10-CM

## 2023-09-25 MED ORDER — METOPROLOL SUCCINATE ER 25 MG PO TB24: 25.0000 mg | ORAL_TABLET | Freq: Every day | ORAL | 3 refills | Status: AC

## 2023-09-25 MED ORDER — POLYSACCHARIDE IRON COMPLEX 150 MG PO CAPS: 150.0000 mg | ORAL_CAPSULE | Freq: Every day | ORAL | 3 refills | Status: AC

## 2023-09-25 NOTE — Patient Instructions (Signed)
 Medication Instructions:  Stop Lopressor  Star Metoprolol  Succinate ER 25 mg once a day  Start and Iron supplement sent to the pharmacy *If you need a refill on your cardiac medications before your next appointment, please call your pharmacy*  Lab Work: No labs  Testing/Procedures: No testing  Follow-Up: At [facility], you and your health needs are our priority.  As part of our continuing mission to provide you with exceptional heart care, our providers are all part of one team.  This team includes your primary Cardiologist (physician) and Advanced Practice Providers or APPs (Physician Assistants and Nurse Practitioners) who all work together to provide you with the care you need, when you need it.  Your next appointment:   6 month(s)  Provider:   Eilleen Grates, MD or Marcie Sever, PA-C  We recommend signing up for the patient portal called "MyChart".  Sign up information is provided on this After Visit Summary.  MyChart is used to connect with patients for Virtual Visits (Telemedicine).  Patients are able to view lab/test results, encounter notes, upcoming appointments, etc.  Non-urgent messages can be sent to your provider as well.   To learn more about what you can do with MyChart, go to [email or web address].   Other Instructions: We are going to refer you over to the ENT specialty, They will reach out to you.

## 2023-10-01 ENCOUNTER — Encounter (INDEPENDENT_AMBULATORY_CARE_PROVIDER_SITE_OTHER): Payer: Self-pay | Admitting: Otolaryngology

## 2023-11-02 ENCOUNTER — Telehealth: Payer: Self-pay | Admitting: Physician Assistant

## 2023-11-02 NOTE — Telephone Encounter (Signed)
 Pt c/o medication issue:  1. Name of Medication: metoprolol  succinate (TOPROL  XL) 25 MG 24 hr tablet   2. How are you currently taking this medication (dosage and times per day)? As written   3. Are you having a reaction (difficulty breathing--STAT)? No   4. What is your medication issue? Pt  called in stating this medication causes her to be dizzy and she asked if she can be switched to something else. Please advise.

## 2023-11-02 NOTE — Telephone Encounter (Signed)
 Call to patient to discuss concerns about her Toprol . Patient states she feels "high" and "woozy" on toprol . She states she was switched from lopressor  to toprol  at her last office visit with A. Duke, PA on 09/25/23. Notes state patient also has a thyroid  condition, patient verifies she has been taking her tapazole  as ordered since it was prescribed to her by her endocrinologist in January. Notes from 09/25/23 state she reported that she felt "high" on lopressor  as well, but patient says toprol  makes her feel worse and she is struggling to get through her work day, even though she takes it at night as directed. She either would like to go back on lopressor  or try a different medication for her PAF. She denies any chest pain or SOB.

## 2023-11-04 NOTE — Progress Notes (Signed)
 Cardiology Office Note:   Date:  11/05/2023  ID:  Karen Ferguson, Karen Ferguson 1955-09-15, MRN [id number] PCP: Sharalyn Dasen  [facility] Providers Cardiologist:  Eilleen Grates, MD Cardiology APP:  Lamond Pilot, [place] {  History of Present Illness:   Karen Ferguson is a 68 y.o. female who presents for evaluation of atrial fibrillation.   The patient was discharged from the hospital two days ago.  She was admitted with abdominal pain and she had AKI.  She was hydrated.  She is hyperthyroid but has not been taking her Tapazole .  TSH was very low this admisison.  She was in atrial fib and rate was controlled with beta blocker.  She has been treated with Eliquis .       She was in office last month with episodes of hypotension.  She was seen by Angie Duke PAc.  At that visit she was changed from metoprolol  tartrate to succinate.  She has had no new cardiovascular problems other than she does have fatigue.  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  She does not have chest pressure, neck or arm discomfort.  She does not have any shortness of breath, PND or orthopnea.  She might have occasional palpitations.  She thinks this is fairly well-controlled.  She is not having any of the low blood pressure episodes that she was having.  She says this seems to be improved.  She chronically sleeps on 3-4 pillows but this is not changed from previous.    ROS: As stated in the HPI and negative for all other systems.  Studies Reviewed:    EKG:     NA  Risk Assessment/Calculations:    CHA2DS2-VASc Score = 3   This indicates a 3.2% annual risk of stroke. The patient's score is based upon: CHF History: 0 HTN History: 1 Diabetes History: 0 Stroke History: 0 Vascular Disease History: 0 Age Score: 1 Gender Score: 1    Physical Exam:   VS:  BP (!) 142/78   Pulse 65   Ht 5' 4 (1.626 m)   Wt 175 lb 3.2 oz (79.5 kg)   SpO2 95%   BMI 30.07 kg/m    Wt Readings from  Last 3 Encounters:  11/05/23 175 lb 3.2 oz (79.5 kg)  09/25/23 199 lb (90.3 kg)  06/16/23 205 lb 3.2 oz (93.1 kg)     GEN: Well nourished, well developed in no acute distress NECK: No JVD; No carotid bruits CARDIAC: RRR, no murmurs, rubs, gallops RESPIRATORY:  Clear to auscultation without rales, wheezing or rhonchi  ABDOMEN: Soft, non-tender, non-distended EXTREMITIES:  No edema; No deformity   ASSESSMENT AND PLAN:   ATRIAL FIB:   The patient has had no symptomatic paroxysms.   For now I am going to continue anticoagulation and she thinks she might have some palpitations occasionally.  I cannot say that she is not having short paroxysms of A-fib.  She tolerates anticoagulation without difficulty.  She will continue the current dose of beta-blocker as she seems to be tolerating this.    HYPERTHYROIDISM: She is followed by endocrinology.  She says she had a biopsy of a thyroid  nodule and this was benign.  I will defer to her primary provider.   CKD II: Her last creatinine a year ago was normal but she had some renal insufficiency before.  I will follow-up with electrolytes and magnesium .    HYPOTENSION:   She  was told to make sure that she hydrates.  No change in therapy.  OSA:  She could not tolerate CPAP.  She is very fatigued.  I will send her to see Dr. Micael Adas to see if there are alternatives to the CPAP.       Follow up with me in 1 year  Signed, Eilleen Grates, MD

## 2023-11-05 ENCOUNTER — Encounter: Payer: Self-pay | Admitting: Cardiology

## 2023-11-05 ENCOUNTER — Ambulatory Visit: Attending: Cardiology | Admitting: Cardiology

## 2023-11-05 VITALS — BP 142/78 | HR 65 | Ht 64.0 in | Wt 175.2 lb

## 2023-11-05 DIAGNOSIS — R002 Palpitations: Secondary | ICD-10-CM

## 2023-11-05 DIAGNOSIS — I10 Essential (primary) hypertension: Secondary | ICD-10-CM | POA: Diagnosis not present

## 2023-11-05 DIAGNOSIS — G4733 Obstructive sleep apnea (adult) (pediatric): Secondary | ICD-10-CM

## 2023-11-05 DIAGNOSIS — I480 Paroxysmal atrial fibrillation: Secondary | ICD-10-CM

## 2023-11-05 DIAGNOSIS — N182 Chronic kidney disease, stage 2 (mild): Secondary | ICD-10-CM

## 2023-11-05 DIAGNOSIS — E0590 Thyrotoxicosis, unspecified without thyrotoxic crisis or storm: Secondary | ICD-10-CM

## 2023-11-05 NOTE — Patient Instructions (Addendum)
 Medication Instructions:  Your physician recommends that you continue on your current medications as directed. Please refer to the Current Medication list given to you today.  *If you need a refill on your cardiac medications before your next appointment, please call your pharmacy*  Lab Work: CMET, Magnesium  today If you have labs (blood work) drawn today and your tests are completely normal, you will receive your results only by: MyChart Message (if you have MyChart) OR A paper copy in the mail If you have any lab test that is abnormal or we need to change your treatment, we will call you to review the results.  Testing/Procedures: NONE  Follow-Up: At [facility], you and your health needs are our priority.  As part of our continuing mission to provide you with exceptional heart care, our providers are all part of one team.  This team includes your primary Cardiologist (physician) and Advanced Practice Providers or APPs (Physician Assistants and Nurse Practitioners) who all work together to provide you with the care you need, when you need it.  Your next appointment:   1 year  Provider:   Lavonne Prairie, MD  We recommend signing up for the patient portal called MyChart.  Sign up information is provided on this After Visit Summary.  MyChart is used to connect with patients for Virtual Visits (Telemedicine).  Patients are able to view lab/test results, encounter notes, upcoming appointments, etc.  Non-urgent messages can be sent to your provider as well.   To learn more about what you can do with MyChart, go to [email or web address].   Other Instructions You have been referred to our sleep team. Someone will reach out to you to make an appointment.

## 2023-11-06 NOTE — Telephone Encounter (Signed)
 Pt seen in clinic by Dr. Lavonne Prairie on 11/05/23. See office note for visit details.

## 2023-11-07 ENCOUNTER — Ambulatory Visit: Payer: Self-pay | Admitting: Cardiology

## 2023-11-07 LAB — COMPREHENSIVE METABOLIC PANEL WITH GFR
ALT: 9 IU/L (ref 0–32)
AST: 15 IU/L (ref 0–40)
Albumin: 3.8 g/dL — ABNORMAL LOW (ref 3.9–4.9)
Alkaline Phosphatase: 88 IU/L (ref 44–121)
BUN/Creatinine Ratio: 10 — ABNORMAL LOW (ref 12–28)
BUN: 11 mg/dL (ref 8–27)
Bilirubin Total: 0.3 mg/dL (ref 0.0–1.2)
CO2: 24 mmol/L (ref 20–29)
Calcium: 9.2 mg/dL (ref 8.7–10.3)
Chloride: 102 mmol/L (ref 96–106)
Creatinine, Ser: 1.11 mg/dL — ABNORMAL HIGH (ref 0.57–1.00)
Globulin, Total: 2.8 g/dL (ref 1.5–4.5)
Glucose: 86 mg/dL (ref 70–99)
Potassium: 4.3 mmol/L (ref 3.5–5.2)
Sodium: 141 mmol/L (ref 134–144)
Total Protein: 6.6 g/dL (ref 6.0–8.5)
eGFR: 54 mL/min/{1.73_m2} — ABNORMAL LOW (ref >59)

## 2023-11-07 LAB — MAGNESIUM: Magnesium: 1.8 mg/dL (ref 1.6–2.3)

## 2023-11-28 ENCOUNTER — Encounter (HOSPITAL_COMMUNITY): Payer: Self-pay

## 2023-11-28 ENCOUNTER — Ambulatory Visit (HOSPITAL_COMMUNITY): Admission: EM | Admit: 2023-11-28 | Discharge: 2023-11-28 | Disposition: A | Discharge status: Home / Self Care

## 2023-11-28 DIAGNOSIS — M79645 Pain in left finger(s): Secondary | ICD-10-CM | POA: Diagnosis not present

## 2023-11-28 MED ORDER — DICLOFENAC SODIUM 1 % EX GEL: 2.0000 g | Freq: Four times a day (QID) | CUTANEOUS | 0 refills | Status: AC

## 2023-11-28 MED ORDER — DEXAMETHASONE SODIUM PHOSPHATE 10 MG/ML IJ SOLN
10.0000 mg | Freq: Once | INTRAMUSCULAR | Status: AC
  Administered 2023-11-28: 10 mg via INTRAMUSCULAR

## 2023-11-28 MED ORDER — DEXAMETHASONE SODIUM PHOSPHATE 10 MG/ML IJ SOLN
INTRAMUSCULAR | Status: AC
  Filled 2023-11-28: qty 1

## 2023-11-28 NOTE — ED Provider Notes (Signed)
 [facility]    CSN: [id number] Arrival date & time: 11/28/23  1209      History   Chief Complaint Chief Complaint  Patient presents with   Hand Pain    Left thumb    HPI Karen Ferguson is a 68 y.o. female.   Patient presents with left thumb pain x 3 months.  Patient states that at times her pain will become stiff and she has difficulty moving it because of this.  Patient denies taking anything for the pain.  Patient denies any known injury to her thumb.  Patient does report a history of arthritis and wonders if this is related to this.  The history is provided by the patient and medical records.  Hand Pain    Past Medical History:  Diagnosis Date   Arthritis    Asthma    Albuterol  prn   Bronchitis    Degenerative disc disease    Depression    Diabetes mellitus without complication (HCC)    Borderline diabetic in the past per pt   History of echocardiogram    Echo 1/18: EF 60-65, no RWMA, Gr 1 DD, PASP 20   Hypertension    PVC's (premature ventricular contractions)    Holter 12/17: NSR, occ PAC/PVCs, no AFib; one 4 beat run NSVT    Patient Active Problem List   Diagnosis Date Noted   Murmur 11/30/2021   CKD (chronic kidney disease), stage II 11/30/2021   PAF (paroxysmal atrial fibrillation) (HCC) 06/19/2021   Hyperthyroidism    Nausea & vomiting    Thyroid  nodule greater than or equal to 1 cm in diameter incidentally noted on imaging study    AKI (acute kidney injury) (HCC) 05/27/2021   Pancreatitis 05/27/2021   COVID-19 virus detected 05/27/2021   Atrial fibrillation with RVR (HCC) 05/27/2021   Abnormal TSH 05/27/2021   Chronic abdominal pain    Atrial fibrillation with rapid ventricular response (HCC) 05/26/2021   Palpitations 04/01/2016   Family history of premature coronary heart disease 04/01/2016   Obstructive sleep apnea 12/28/2015   Bilateral leg numbness 07/03/2015   GERD (gastroesophageal reflux disease) 07/03/2015   Constipation  07/03/2015   Dizziness and giddiness 01/10/2015   Foot pain 01/10/2013   Tendon tear, ankle 01/10/2013   Cyst and pseudocyst of pancreas 03/16/2012   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   Degenerative disc disease    Asthma 06/10/2011   Obesity 10/17/2010   DENTAL CARIES 12/15/2008   OSTEOARTHRITIS, GENERALIZED, MULTIPLE JOINTS 12/15/2008   Essential hypertension 09/18/2007   OVERACTIVE BLADDER 04/29/2007   RECTAL BLEEDING 09/23/2006    Past Surgical History:  Procedure Laterality Date   ABDOMINAL HYSTERECTOMY  1988   for fibroid tumors   CARPAL TUNNEL RELEASE  1985   rt   CHOLECYSTECTOMY  1989   HEMORRHOID SURGERY  1986   KNEE ARTHROSCOPY Right    TENDON REPAIR Right 03/03/2013   Procedure: RIGHT DEBRIDEMENT AND TENOLYSIS OF PERONEOUS LONGOUS AND BREVIS TENDONS ;  Surgeon: Norleen Armor, MD;  Location: [facility];  Service: Orthopedics;  Laterality: Right;   TOOTH EXTRACTION N/A 03/25/2019   Procedure: DENTAL EXTRACTIONS TEETH NUMBER THREE, FOUR, FIVE, SIX, SEVEN, NINE, TWELVE, FOURTEEN, THIRTY AND ALVEOLOPLASTY;  Surgeon: Sheryle Hamilton, DDS;  Location: [facility];  Service: Oral Surgery;  Laterality: N/A;    OB History   No obstetric history on file.      Home Medications    Prior to Admission medications   Medication Sig Start  Date End Date Taking? Authorizing Provider  diclofenac  Sodium (VOLTAREN  ARTHRITIS PAIN) 1 % GEL Apply 2 g topically 4 (four) times daily. 11/28/23  Yes Johnie, Chania Kochanski A, NP  famotidine (PEPCID) 20 MG tablet Take 20 mg by mouth daily. 11/11/23  Yes [provider]  acetaminophen  (TYLENOL ) 500 MG tablet Take 500 mg by mouth every 6 (six) hours as needed for mild pain.    [provider]  albuterol  (VENTOLIN  HFA) 108 (90 Base) MCG/ACT inhaler INHALE 2 PUFFS INTO THE LUNGS FOUR TIMES DAILY AS NEEDED FOR SHORTNESS OF BREATH. 06/21/20   Tisovec, Charlie ORN, MD  apixaban  (ELIQUIS ) 5 MG TABS tablet Take 1 tablet (5 mg total) by mouth  2 (two) times daily. 05/30/21   Jerri Keys, MD  iron  polysaccharides (NU-IRON ) 150 MG capsule Take 1 capsule (150 mg total) by mouth daily. 09/25/23   Duke, Jon Garre, PA  methimazole  (TAPAZOLE ) 5 MG tablet Take 1 tablet (5 mg total) by mouth as directed. 1 tablet Monday through Friday and none on Saturdays and Sundays 06/17/23   Shamleffer, Ibtehal Jaralla, MD  metoprolol  succinate (TOPROL  XL) 25 MG 24 hr tablet Take 1 tablet (25 mg total) by mouth at bedtime. 09/25/23   Duke, Jon Garre, PA  omeprazole  (PRILOSEC) 40 MG capsule Take 40 mg by mouth daily.    [provider]    Family History Family History  Problem Relation Age of Onset   Thyroid  disease Mother    Arthritis Mother    Hypertension Mother    Diabetes Mother    Kidney disease Mother    Arrhythmia Mother        AFib   Thyroid  disease Sister    Arthritis Sister    Heart disease Sister    Hypertension Brother    Heart disease Maternal Grandfather    Heart attack Maternal Grandfather 65       MI   Heart disease Maternal Aunt    Heart failure Maternal Aunt    Heart disease Maternal Uncle    Cancer Maternal Uncle     Social History Social History   Tobacco Use   Smoking status: Never   Smokeless tobacco: Never  Vaping Use   Vaping status: Never Used  Substance Use Topics   Alcohol use: Yes    Comment: occasional   Drug use: No     Allergies   Shellfish allergy   Review of Systems Review of Systems  Per HPI  Physical Exam Triage Vital Signs ED Triage Vitals  Encounter Vitals Group     BP 11/28/23 1259 119/74     Girls Systolic BP Percentile --      Girls Diastolic BP Percentile --      Boys Systolic BP Percentile --      Boys Diastolic BP Percentile --      Pulse Rate 11/28/23 1259 75     Resp 11/28/23 1259 16     Temp 11/28/23 1259 98.2 F (36.8 C)     Temp Source 11/28/23 1259 Oral     SpO2 11/28/23 1259 93 %     Weight --      Height --      Head Circumference --      Peak Flow  --      Pain Score 11/28/23 1300 6     Pain Loc --      Pain Education --      Exclude from Growth Chart --    No  data found.  Updated Vital Signs BP 119/74 (BP Location: Left Arm)   Pulse 75   Temp 98.2 F (36.8 C) (Oral)   Resp 16   SpO2 93%   Visual Acuity Right Eye Distance:   Left Eye Distance:   Bilateral Distance:    Right Eye Near:   Left Eye Near:    Bilateral Near:     Physical Exam Vitals and nursing note reviewed.  Constitutional:      General: She is awake. She is not in acute distress.    Appearance: Normal appearance. She is well-developed and well-groomed. She is not ill-appearing.  Musculoskeletal:     Left hand: Tenderness present. No swelling or deformity. Normal range of motion. Normal strength. Normal sensation. Normal capillary refill. Normal pulse.  Skin:    General: Skin is warm and dry.  Neurological:     Mental Status: She is alert.  Psychiatric:        Behavior: Behavior is cooperative.      UC Treatments / Results  Labs (all labs ordered are listed, but only abnormal results are displayed) Labs Reviewed - No data to display  EKG   Radiology No results found.  Procedures Procedures (including critical care time)  Medications Ordered in UC Medications  dexamethasone  (DECADRON ) injection 10 mg (has no administration in time range)    Initial Impression / Assessment and Plan / UC Course  I have reviewed the triage vital signs and the nursing notes.  Pertinent labs & imaging results that were available during my care of the patient were reviewed by me and considered in my medical decision making (see chart for details).     Patient is overall well-appearing.  Vitals are stable.  Upon assessment there is tenderness noted to generalized left thumb.  Without swelling, deformity, decreased range of motion and decreased strength.  Given IM Decadron  in clinic to help decrease inflammation related to pain.  Provided patient with  thumb spica for compression and comfort.  Prescribed Voltaren  gel to apply to the area for pain relief.  Recommended Tylenol  for additional pain relief.  Given orthopedic follow-up.  Discussed follow-up return precautions. Final Clinical Impressions(s) / UC Diagnoses   Final diagnoses:  Pain of left thumb     Discharge Instructions      You received an injection of Decadron  in clinic today which is a steroid to help with inflammation causing your pain. As discussed your pain could be related to arthritis or it could also be related to some underlying tendinitis. Apply Voltaren  gel to the area up to 4 times daily as needed for additional pain relief. You can take 500 mg of Tylenol  every 6-8 hours as needed for additional pain relief. Wear brace as needed for comfort and compression. Alternate between ice and heat as needed for pain. Follow-up with [facility] if your pain continues for further evaluation and management. Otherwise follow-up with your primary care provider or return here as needed.     ED Prescriptions     Medication Sig Dispense Auth. Provider   diclofenac  Sodium (VOLTAREN  ARTHRITIS PAIN) 1 % GEL Apply 2 g topically 4 (four) times daily. 20 g Johnie Flaming A, NP      PDMP not reviewed this encounter.   Johnie Flaming A, NP 11/28/23 1336

## 2023-11-28 NOTE — Discharge Instructions (Addendum)
 You received an injection of Decadron  in clinic today which is a steroid to help with inflammation causing your pain. As discussed your pain could be related to arthritis or it could also be related to some underlying tendinitis. Apply Voltaren  gel to the area up to 4 times daily as needed for additional pain relief. You can take 500 mg of Tylenol  every 6-8 hours as needed for additional pain relief. Wear brace as needed for comfort and compression. Alternate between ice and heat as needed for pain. Follow-up with [facility] if your pain continues for further evaluation and management. Otherwise follow-up with your primary care provider or return here as needed.

## 2023-11-28 NOTE — ED Triage Notes (Signed)
 Patient here today with c/o left thumb pain X 3 months. No known injury.

## 2023-12-16 ENCOUNTER — Encounter: Payer: Self-pay | Admitting: Internal Medicine

## 2023-12-16 ENCOUNTER — Ambulatory Visit (INDEPENDENT_AMBULATORY_CARE_PROVIDER_SITE_OTHER): Payer: 59 | Admitting: Internal Medicine

## 2023-12-16 VITALS — BP 120/74 | HR 74 | Ht 64.0 in | Wt 200.0 lb

## 2023-12-16 DIAGNOSIS — E042 Nontoxic multinodular goiter: Secondary | ICD-10-CM

## 2023-12-16 DIAGNOSIS — E0590 Thyrotoxicosis, unspecified without thyrotoxic crisis or storm: Secondary | ICD-10-CM | POA: Diagnosis not present

## 2023-12-16 LAB — T4, FREE: Free T4: 1 ng/dL (ref 0.8–1.8)

## 2023-12-16 LAB — TSH: TSH: 1.96 m[IU]/L (ref 0.40–4.50)

## 2023-12-16 NOTE — Progress Notes (Unsigned)
 Name: [facility]  MRN/ DOB: [id number], 12/12/1955    Age/ Sex: 68 y.o., female     PCP: Leonce Carola PARAS DEVONNA   Reason for Endocrinology Evaluation: Hyperthyroidism     Initial Endocrinology Clinic Visit: 06/05/2021    PATIENT IDENTIFIER: Karen Ferguson is a 68 y.o., female with a past medical history of HTN, A.Fib, MNG. She has followed with [facility] since 06/05/2021 for consultative assistance with management of her hyperthyroidism.   HISTORICAL SUMMARY: The patient was first diagnosed with hyperthyroidism in 05/2021 with a suppressed TSH <0.01 uIU/mL and elevated FT4 at 2.91 ng/dL   She was started on Methimazole  05/2021  Thyroid  ultrasound 05/2021 revealed MNG. She is S/P benign FNA of the isthmic 2.5 cm nodule ( Bethesda category II) on 08/26/2021   Sister with thyroid  disease    She was seen by Dr. Kassie from January 2023 until April 2023    SUBJECTIVE:    Today (12/16/2023):  Ms. [name] is here for a follow up on hyperthyroidism and MNG.   No Local neck swelling  Has occasional  palpitation Minimal tremors  Continues chronic constipation No eye symptoms    Methimazole  5 mg , 1 tab Monday through Friday, none on Saturdays or Sundays  HISTORY:  Past Medical History:  Past Medical History:  Diagnosis Date   Arthritis    Asthma    Albuterol  prn   Bronchitis    Degenerative disc disease    Depression    Diabetes mellitus without complication (HCC)    Borderline diabetic in the past per pt   History of echocardiogram    Echo 1/18: EF 60-65, no RWMA, Gr 1 DD, PASP 20   Hypertension    PVC's (premature ventricular contractions)    Holter 12/17: NSR, occ PAC/PVCs, no AFib; one 4 beat run NSVT   Past Surgical History:  Past Surgical History:  Procedure Laterality Date   ABDOMINAL HYSTERECTOMY  1988   for fibroid tumors   CARPAL TUNNEL RELEASE  1985   rt   CHOLECYSTECTOMY  1989   HEMORRHOID SURGERY  1986   KNEE ARTHROSCOPY Right     TENDON REPAIR Right 03/03/2013   Procedure: RIGHT DEBRIDEMENT AND TENOLYSIS OF PERONEOUS LONGOUS AND BREVIS TENDONS ;  Surgeon: Norleen Armor, MD;  Location: [facility];  Service: Orthopedics;  Laterality: Right;   TOOTH EXTRACTION N/A 03/25/2019   Procedure: DENTAL EXTRACTIONS TEETH NUMBER THREE, FOUR, FIVE, SIX, SEVEN, NINE, TWELVE, FOURTEEN, THIRTY AND ALVEOLOPLASTY;  Surgeon: Sheryle Hamilton, DDS;  Location: [facility];  Service: Oral Surgery;  Laterality: N/A;   Social History:  reports that she has never smoked. She has never used smokeless tobacco. She reports current alcohol use. She reports that she does not use drugs. Family History:  Family History  Problem Relation Age of Onset   Thyroid  disease Mother    Arthritis Mother    Hypertension Mother    Diabetes Mother    Kidney disease Mother    Arrhythmia Mother        AFib   Thyroid  disease Sister    Arthritis Sister    Heart disease Sister    Hypertension Brother    Heart disease Maternal Grandfather    Heart attack Maternal Grandfather 75       MI   Heart disease Maternal Aunt    Heart failure Maternal Aunt    Heart disease Maternal Uncle    Cancer Maternal Uncle  HOME MEDICATIONS: Allergies as of 12/16/2023       Reactions   Shellfish Allergy Hives        Medication List        Accurate as of December 16, 2023  3:11 PM. If you have any questions, ask your nurse or doctor.          acetaminophen  500 MG tablet Commonly known as: TYLENOL  Take 500 mg by mouth every 6 (six) hours as needed for mild pain.   albuterol  108 (90 Base) MCG/ACT inhaler Commonly known as: VENTOLIN  HFA INHALE 2 PUFFS INTO THE LUNGS FOUR TIMES DAILY AS NEEDED FOR SHORTNESS OF BREATH.   diclofenac  Sodium 1 % Gel Commonly known as: Voltaren  Arthritis Pain Apply 2 g topically 4 (four) times daily.   Eliquis  5 MG Tabs tablet Generic drug: apixaban  Take 1 tablet (5 mg total) by mouth 2 (two) times daily.   famotidine 20 MG  tablet Commonly known as: PEPCID Take 20 mg by mouth daily.   iron  polysaccharides 150 MG capsule Commonly known as: Nu-Iron  Take 1 capsule (150 mg total) by mouth daily.   methimazole  5 MG tablet Commonly known as: TAPAZOLE  Take 1 tablet (5 mg total) by mouth as directed. 1 tablet Monday through Friday and none on Saturdays and Sundays   metoprolol  succinate 25 MG 24 hr tablet Commonly known as: Toprol  XL Take 1 tablet (25 mg total) by mouth at bedtime.   omeprazole  40 MG capsule Commonly known as: PRILOSEC Take 40 mg by mouth daily.   Vitamin D (Ergocalciferol) 1.25 MG (50000 UNIT) Caps capsule Commonly known as: DRISDOL Take 50,000 Units by mouth once a week.          OBJECTIVE:   PHYSICAL EXAM: VS: Ht 5' 4 (1.626 m)   Wt 200 lb (90.7 kg)   BMI 34.33 kg/m    EXAM: General: Pt appears well and is in NAD  Neck: General: Supple without adenopathy. Thyroid : Thyroid   nodules appreciated  Lungs: Clear with good BS bilat   Heart: Auscultation: RRR.  Extremities:  BL LE: trace pretibial edema   Mental Status: Judgment, insight: Intact Orientation: Oriented to time, place, and person Mood and affect: No depression, anxiety, or agitation     DATA REVIEWED:   Latest Reference Range & Units 12/16/23 15:23  TSH 0.40 - 4.50 mIU/L 1.96  T4,Free(Direct) 0.8 - 1.8 ng/dL 1.0   Thyroid  Ultrasound 07/03/2023 Estimated total number of nodules >/= 1 cm: 4   Number of spongiform nodules >/=  2 cm not described below (TR1): 0   Number of mixed cystic and solid nodules >/= 1.5 cm not described below (TR2): 0   _________________________________________________________   Nodule 1: Previously biopsied isthmus nodule is not significantly changed in size since 06/06/2022 when measured in a similar manner. It currently measures 4.0 x 3.4 x 2.2 cm. When measured in a similar manner on the prior examination from 06/06/2022 it measured 3.9 x 3.2 x 2.0 cm. Please correlate with  prior FNA results from 08/26/2021.   _________________________________________________________   Nodule 2: 1.1 x 0.7 x 0.8 cm solid isoechoic right superior thyroid  nodule (TI-RADS 3) is not significantly changed in size since prior examination and still does not meet criteria for imaging surveillance or FNA.   _________________________________________________________   Nodule 3: 3.4 x 3.1 x 2.7 cm diffusely heterogeneous region in the mid left thyroid  lobe is again seen and is favored to be a pseudonodule. It is similar to appearance on  prior exams.   _________________________________________________________   Nodule 4: 1.8 x 1.7 x 1.2 cm cystic nodule in the inferior left thyroid  lobe does not meet criteria for imaging surveillance or FNA.   IMPRESSION: 1. Previously biopsied isthmus nodule is not significantly changed in size when measured with similar technique. Please correlate with prior FNA results. 2. Diffusely enlarged heterogeneous goiter again seen.    FNA isthmic nodule 08/26/2021  Clinical History: Isthmus; Superior 2.5cm' Other 2 dimensions: 1.5 x  1.9cm, Solid / almost completely solid, Isoechoic, TI-RADS total points  6  Specimen Submitted:  A. THYROID , SUPERIOR ISTHMUS, FINE NEEDLE  ASPIRATION:    FINAL MICROSCOPIC DIAGNOSIS:  - Consistent with benign follicular nodule (Bethesda category II)     ASSESSMENT / PLAN / RECOMMENDATIONS:   Hyperthyroidism  -Patient is clinically euthyroid -Tolerating methimazole  without side effects - TFT's today are within normal range, no change  Medications   Continue methimazole  5 mg daily Monday through Friday, none on Saturdays and Sundays   2.  Multinodular goiter:  -No local neck symptoms -She is s/p benign FNA of the isthmic 2.5 cm nodule 08/2021 -Thyroid  ultrasound 06/2023 showed stability.  Follow-up in 6 months    Signed electronically by: Stefano Redgie Butts, MD  [facility] Endocrinology  [facility] [address] [place] [place], [place] [address] Phone: [phone] FAX: [phone]      CC: Jackson, Kerra J, PA-C [address] [place] [address] Phone: [phone]  Fax: [phone]   Return to Endocrinology clinic as below: No future appointments.

## 2023-12-17 ENCOUNTER — Ambulatory Visit: Payer: Self-pay | Admitting: Internal Medicine

## 2023-12-17 MED ORDER — METHIMAZOLE 5 MG PO TABS: 5.0000 mg | ORAL_TABLET | ORAL | 3 refills | Status: DC

## 2024-02-11 ENCOUNTER — Other Ambulatory Visit: Payer: Self-pay | Admitting: Cardiology

## 2024-06-21 ENCOUNTER — Other Ambulatory Visit

## 2024-06-21 ENCOUNTER — Ambulatory Visit: Admitting: Internal Medicine

## 2024-06-21 ENCOUNTER — Encounter: Payer: Self-pay | Admitting: Internal Medicine

## 2024-06-21 VITALS — BP 136/80 | HR 68 | Ht 64.0 in | Wt 201.0 lb

## 2024-06-21 DIAGNOSIS — E042 Nontoxic multinodular goiter: Secondary | ICD-10-CM

## 2024-06-21 DIAGNOSIS — E0590 Thyrotoxicosis, unspecified without thyrotoxic crisis or storm: Secondary | ICD-10-CM

## 2024-06-21 NOTE — Progress Notes (Unsigned)
 "  Name: Karen Ferguson [facility]  MRN/ DOB: [id number], Mar 06, 1956    Age/ Sex: 69 y.o., female     PCP: Leonce Carola PARAS DEVONNA   Reason for Endocrinology Evaluation: Hyperthyroidism     Initial Endocrinology Clinic Visit: 06/05/2021    PATIENT IDENTIFIER: Karen Ferguson is a 69 y.o., female with a past medical history of HTN, A.Fib, MNG. She has followed with [facility] since 06/05/2021 for consultative assistance with management of her hyperthyroidism.   HISTORICAL SUMMARY: The patient was first diagnosed with hyperthyroidism in 05/2021 with a suppressed TSH <0.01 uIU/mL and elevated FT4 at 2.91 ng/dL   She was started on Methimazole  05/2021  Thyroid  ultrasound 05/2021 revealed MNG. She is S/P benign FNA of the isthmic 2.5 cm nodule ( Bethesda category II) on 08/26/2021   Sister with thyroid  disease    She was seen by Dr. Kassie from January 2023 until April 2023    SUBJECTIVE:    Today (06/21/2024):  Karen Ferguson is here for a follow up on hyperthyroidism and MNG.  The patient has been receiving iron  infusion, last infusion was October, 2025 Energy is stable  No local neck swelling  Has gerd issues , with occasional dysphagia  Has occasional palpitations  Has chronic constipation- Ibsrela    Methimazole  5 mg , 1 tab Monday through Friday, none on Saturdays or Sundays  HISTORY:  Past Medical History:  Past Medical History:  Diagnosis Date   Arthritis    Asthma    Albuterol  prn   Bronchitis    Degenerative disc disease    Depression    Diabetes mellitus without complication (HCC)    Borderline diabetic in the past per pt   History of echocardiogram    Echo 1/18: EF 60-65, no RWMA, Gr 1 DD, PASP 20   Hypertension    PVC's (premature ventricular contractions)    Holter 12/17: NSR, occ PAC/PVCs, no AFib; one 4 beat run NSVT   Past Surgical History:  Past Surgical History:  Procedure Laterality Date   ABDOMINAL HYSTERECTOMY  1988   for fibroid tumors   CARPAL  TUNNEL RELEASE  1985   rt   CHOLECYSTECTOMY  1989   HEMORRHOID SURGERY  1986   KNEE ARTHROSCOPY Right    TENDON REPAIR Right 03/03/2013   Procedure: RIGHT DEBRIDEMENT AND TENOLYSIS OF PERONEOUS LONGOUS AND BREVIS TENDONS ;  Surgeon: Norleen Armor, MD;  Location: [facility];  Service: Orthopedics;  Laterality: Right;   TOOTH EXTRACTION N/A 03/25/2019   Procedure: DENTAL EXTRACTIONS TEETH NUMBER THREE, FOUR, FIVE, SIX, SEVEN, NINE, TWELVE, FOURTEEN, THIRTY AND ALVEOLOPLASTY;  Surgeon: Sheryle Hamilton, DDS;  Location: [facility] OR;  Service: Oral Surgery;  Laterality: N/A;   Social History:  reports that she has never smoked. She has never used smokeless tobacco. She reports current alcohol use. She reports that she does not use drugs. Family History:  Family History  Problem Relation Age of Onset   Thyroid  disease Mother    Arthritis Mother    Hypertension Mother    Diabetes Mother    Kidney disease Mother    Arrhythmia Mother        AFib   Thyroid  disease Sister    Arthritis Sister    Heart disease Sister    Hypertension Brother    Heart disease Maternal Grandfather    Heart attack Maternal Grandfather 45       MI   Heart disease Maternal Aunt    Heart  failure Maternal Aunt    Heart disease Maternal Uncle    Cancer Maternal Uncle      HOME MEDICATIONS: Allergies as of 06/21/2024       Reactions   Shellfish Allergy Hives        Medication List        Accurate as of June 21, 2024  3:12 PM. If you have any questions, ask your nurse or doctor.          acetaminophen  500 MG tablet Commonly known as: TYLENOL  Take 500 mg by mouth every 6 (six) hours as needed for mild pain.   albuterol  108 (90 Base) MCG/ACT inhaler Commonly known as: VENTOLIN  HFA INHALE 2 PUFFS INTO THE LUNGS FOUR TIMES DAILY AS NEEDED FOR SHORTNESS OF BREATH.   diclofenac  Sodium 1 % Gel Commonly known as: Voltaren  Arthritis Pain Apply 2 g topically 4 (four) times daily.   Eliquis  5 MG  Tabs tablet Generic drug: apixaban  Take 1 tablet (5 mg total) by mouth 2 (two) times daily.   famotidine 20 MG tablet Commonly known as: PEPCID Take 20 mg by mouth daily.   iron  polysaccharides 150 MG capsule Commonly known as: Nu-Iron  Take 1 capsule (150 mg total) by mouth daily.   methimazole  5 MG tablet Commonly known as: TAPAZOLE  Take 1 tablet (5 mg total) by mouth as directed. 1 tablet Monday through Friday and none on Saturdays and Sundays   metoprolol  succinate 25 MG 24 hr tablet Commonly known as: Toprol  XL Take 1 tablet (25 mg total) by mouth at bedtime.   omeprazole  40 MG capsule Commonly known as: PRILOSEC Take 40 mg by mouth daily.   Vitamin D (Ergocalciferol) 1.25 MG (50000 UNIT) Caps capsule Commonly known as: DRISDOL Take 50,000 Units by mouth once a week.          OBJECTIVE:   PHYSICAL EXAM: VS: BP 136/80   Pulse 68   Ht 5' 4 (1.626 m)   Wt 201 lb (91.2 kg)   SpO2 98%   BMI 34.50 kg/m    EXAM: General: Pt appears well and is in NAD  Neck: General: Supple without adenopathy. Thyroid : Thyroid   nodules appreciated  Lungs: Clear with good BS bilat   Heart: Auscultation: RRR.  Extremities:  BL LE: no pretibial edema   Mental Status: Judgment, insight: Intact Orientation: Oriented to time, place, and person Mood and affect: No depression, anxiety, or agitation     DATA REVIEWED: *****  Thyroid  Ultrasound 07/03/2023 Estimated total number of nodules >/= 1 cm: 4   Number of spongiform nodules >/=  2 cm not described below (TR1): 0   Number of mixed cystic and solid nodules >/= 1.5 cm not described below (TR2): 0   _________________________________________________________   Nodule 1: Previously biopsied isthmus nodule is not significantly changed in size since 06/06/2022 when measured in a similar manner. It currently measures 4.0 x 3.4 x 2.2 cm. When measured in a similar manner on the prior examination from 06/06/2022 it measured 3.9  x 3.2 x 2.0 cm. Please correlate with prior FNA results from 08/26/2021.   _________________________________________________________   Nodule 2: 1.1 x 0.7 x 0.8 cm solid isoechoic right superior thyroid  nodule (TI-RADS 3) is not significantly changed in size since prior examination and still does not meet criteria for imaging surveillance or FNA.   _________________________________________________________   Nodule 3: 3.4 x 3.1 x 2.7 cm diffusely heterogeneous region in the mid left thyroid  lobe is again seen and is favored  to be a pseudonodule. It is similar to appearance on prior exams.   _________________________________________________________   Nodule 4: 1.8 x 1.7 x 1.2 cm cystic nodule in the inferior left thyroid  lobe does not meet criteria for imaging surveillance or FNA.   IMPRESSION: 1. Previously biopsied isthmus nodule is not significantly changed in size when measured with similar technique. Please correlate with prior FNA results. 2. Diffusely enlarged heterogeneous goiter again seen.    FNA isthmic nodule 08/26/2021  Clinical History: Isthmus; Superior 2.5cm' Other 2 dimensions: 1.5 x  1.9cm, Solid / almost completely solid, Isoechoic, TI-RADS total points  6  Specimen Submitted:  A. THYROID , SUPERIOR ISTHMUS, FINE NEEDLE  ASPIRATION:    FINAL MICROSCOPIC DIAGNOSIS:  - Consistent with benign follicular nodule (Bethesda category II)     ASSESSMENT / PLAN / RECOMMENDATIONS:   Hyperthyroidism  - Patient is clinically euthyroid -We briefly discussed RAI ablation, we discussed the need for lifelong LT-4 replacement - TFTs**  Medications   Continue methimazole  5 mg daily Monday through Friday, none on Saturdays and Sundays   2.  Multinodular goiter:  - No local neck symptoms -She is s/p benign FNA of the isthmic 2.5 cm nodule 08/2021 - Will proceed with thyroid  ultrasound  Follow-up in 6 months   Signed electronically by: Stefano Redgie Butts,  MD  [facility]  [facility] [address] [place], [place] [address] Phone: [phone] FAX: [phone]      CC: Jackson, Kerra J, PA-C [address] [place] [address] Phone: [phone]  Fax: [phone]   Return to Endocrinology clinic as below: No future appointments.       "

## 2024-06-22 LAB — TSH: TSH: 0.76 m[IU]/L (ref 0.40–4.50)

## 2024-06-22 LAB — T4, FREE: Free T4: 1 ng/dL (ref 0.8–1.8)

## 2024-06-22 MED ORDER — METHIMAZOLE 5 MG PO TABS: 5.0000 mg | ORAL_TABLET | ORAL | 3 refills | Status: AC

## 2024-07-22 ENCOUNTER — Other Ambulatory Visit
